# Patient Record
Sex: Female | Born: 1935 | Race: White | Hispanic: No | Smoking: Never smoker
Health system: Southern US, Community
[De-identification: ages and names within clinical notes are randomized; demographics above are authoritative.]

## PROBLEM LIST (undated history)

## (undated) DIAGNOSIS — A0472 Enterocolitis due to Clostridium difficile, not specified as recurrent: Secondary | ICD-10-CM

## (undated) DIAGNOSIS — S40812A Abrasion of left upper arm, initial encounter: Secondary | ICD-10-CM

## (undated) DIAGNOSIS — E079 Disorder of thyroid, unspecified: Secondary | ICD-10-CM

## (undated) DIAGNOSIS — S22080A Wedge compression fracture of T11-T12 vertebra, initial encounter for closed fracture: Secondary | ICD-10-CM

## (undated) DIAGNOSIS — R4182 Altered mental status, unspecified: Secondary | ICD-10-CM

## (undated) DIAGNOSIS — H547 Unspecified visual loss: Secondary | ICD-10-CM

## (undated) DIAGNOSIS — H3530 Unspecified macular degeneration: Secondary | ICD-10-CM

## (undated) DIAGNOSIS — G2581 Restless legs syndrome: Secondary | ICD-10-CM

## (undated) DIAGNOSIS — Z8619 Personal history of other infectious and parasitic diseases: Secondary | ICD-10-CM

## (undated) DIAGNOSIS — K047 Periapical abscess without sinus: Secondary | ICD-10-CM

## (undated) DIAGNOSIS — F329 Major depressive disorder, single episode, unspecified: Secondary | ICD-10-CM

## (undated) DIAGNOSIS — M19079 Primary osteoarthritis, unspecified ankle and foot: Secondary | ICD-10-CM

## (undated) DIAGNOSIS — E876 Hypokalemia: Secondary | ICD-10-CM

## (undated) DIAGNOSIS — IMO0002 Reserved for concepts with insufficient information to code with codable children: Secondary | ICD-10-CM

## (undated) DIAGNOSIS — F32A Depression, unspecified: Secondary | ICD-10-CM

## (undated) DIAGNOSIS — N289 Disorder of kidney and ureter, unspecified: Secondary | ICD-10-CM

## (undated) DIAGNOSIS — B370 Candidal stomatitis: Secondary | ICD-10-CM

## (undated) DIAGNOSIS — M1990 Unspecified osteoarthritis, unspecified site: Secondary | ICD-10-CM

## (undated) DIAGNOSIS — J300 Vasomotor rhinitis: Secondary | ICD-10-CM

## (undated) DIAGNOSIS — R0981 Nasal congestion: Secondary | ICD-10-CM

## (undated) DIAGNOSIS — I10 Essential (primary) hypertension: Secondary | ICD-10-CM

## (undated) DIAGNOSIS — R5381 Other malaise: Secondary | ICD-10-CM

## (undated) DIAGNOSIS — L89153 Pressure ulcer of sacral region, stage 3: Secondary | ICD-10-CM

## (undated) DIAGNOSIS — R945 Abnormal results of liver function studies: Secondary | ICD-10-CM

## (undated) HISTORY — DX: Periapical abscess without sinus: K04.7

## (undated) HISTORY — DX: Unspecified osteoarthritis, unspecified site: M19.90

## (undated) HISTORY — DX: Restless legs syndrome: G25.81

## (undated) HISTORY — DX: Abrasion of left upper arm, initial encounter: S40.812A

## (undated) HISTORY — DX: Personal history of other infectious and parasitic diseases: Z86.19

## (undated) HISTORY — PX: THYROIDECTOMY: SHX17

## (undated) HISTORY — DX: Nasal congestion: R09.81

## (undated) HISTORY — DX: Wedge compression fracture of t11-T12 vertebra, initial encounter for closed fracture: S22.080A

## (undated) HISTORY — DX: Depression, unspecified: F32.A

## (undated) HISTORY — DX: Primary osteoarthritis, unspecified ankle and foot: M19.079

## (undated) HISTORY — PX: ABDOMINAL HYSTERECTOMY: SHX81

## (undated) HISTORY — DX: Other malaise: R53.81

## (undated) HISTORY — DX: Pressure ulcer of sacral region, stage 3: L89.153

## (undated) HISTORY — DX: Vasomotor rhinitis: J30.0

## (undated) HISTORY — DX: Reserved for concepts with insufficient information to code with codable children: IMO0002

## (undated) HISTORY — DX: Enterocolitis due to Clostridium difficile, not specified as recurrent: A04.72

## (undated) HISTORY — DX: Abnormal results of liver function studies: R94.5

## (undated) HISTORY — PX: REVISION TOTAL KNEE ARTHROPLASTY: SHX767

## (undated) HISTORY — DX: Disorder of thyroid, unspecified: E07.9

## (undated) HISTORY — DX: Candidal stomatitis: B37.0

## (undated) HISTORY — DX: Major depressive disorder, single episode, unspecified: F32.9

## (undated) HISTORY — PX: CATARACT EXTRACTION, BILATERAL: SHX1313

## (undated) HISTORY — PX: BACK SURGERY: SHX140

## (undated) HISTORY — DX: Essential (primary) hypertension: I10

---

## 2011-11-23 ENCOUNTER — Ambulatory Visit (HOSPITAL_BASED_OUTPATIENT_CLINIC_OR_DEPARTMENT_OTHER)
Admission: RE | Admit: 2011-11-23 | Discharge: 2011-11-23 | Disposition: A | Discharge status: Home / Self Care | Payer: Medicare Other | Source: Ambulatory Visit | Attending: Internal Medicine | Admitting: Internal Medicine

## 2011-11-23 ENCOUNTER — Ambulatory Visit (INDEPENDENT_AMBULATORY_CARE_PROVIDER_SITE_OTHER): Payer: PRIVATE HEALTH INSURANCE | Admitting: Internal Medicine

## 2011-11-23 ENCOUNTER — Encounter: Payer: Self-pay | Admitting: Internal Medicine

## 2011-11-23 DIAGNOSIS — F418 Other specified anxiety disorders: Secondary | ICD-10-CM

## 2011-11-23 DIAGNOSIS — H6120 Impacted cerumen, unspecified ear: Secondary | ICD-10-CM

## 2011-11-23 DIAGNOSIS — L608 Other nail disorders: Secondary | ICD-10-CM

## 2011-11-23 DIAGNOSIS — R0609 Other forms of dyspnea: Secondary | ICD-10-CM

## 2011-11-23 DIAGNOSIS — M25519 Pain in unspecified shoulder: Secondary | ICD-10-CM | POA: Insufficient documentation

## 2011-11-23 DIAGNOSIS — S43006A Unspecified dislocation of unspecified shoulder joint, initial encounter: Secondary | ICD-10-CM | POA: Insufficient documentation

## 2011-11-23 DIAGNOSIS — R0600 Dyspnea, unspecified: Secondary | ICD-10-CM

## 2011-11-23 DIAGNOSIS — L603 Nail dystrophy: Secondary | ICD-10-CM

## 2011-11-23 DIAGNOSIS — E039 Hypothyroidism, unspecified: Secondary | ICD-10-CM

## 2011-11-23 DIAGNOSIS — Z79899 Other long term (current) drug therapy: Secondary | ICD-10-CM

## 2011-11-23 DIAGNOSIS — X58XXXA Exposure to other specified factors, initial encounter: Secondary | ICD-10-CM | POA: Insufficient documentation

## 2011-11-23 DIAGNOSIS — F341 Dysthymic disorder: Secondary | ICD-10-CM

## 2011-11-23 LAB — HEPATIC FUNCTION PANEL
Albumin: 3.9 g/dL (ref 3.5–5.2)
Total Protein: 6.7 g/dL (ref 6.0–8.3)

## 2011-11-23 LAB — CBC
MCH: 31.1 pg (ref 26.0–34.0)
MCHC: 33.5 g/dL (ref 30.0–36.0)
Platelets: 294 10*3/uL (ref 150–400)
RDW: 14.6 % (ref 11.5–15.5)

## 2011-11-23 LAB — TSH: TSH: 1.716 u[IU]/mL (ref 0.350–4.500)

## 2011-11-24 LAB — BRAIN NATRIURETIC PEPTIDE: Brain Natriuretic Peptide: 98.6 pg/mL (ref 0.0–100.0)

## 2011-11-25 DIAGNOSIS — E039 Hypothyroidism, unspecified: Secondary | ICD-10-CM | POA: Insufficient documentation

## 2011-11-25 DIAGNOSIS — F418 Other specified anxiety disorders: Secondary | ICD-10-CM | POA: Insufficient documentation

## 2011-11-25 DIAGNOSIS — M25519 Pain in unspecified shoulder: Secondary | ICD-10-CM | POA: Insufficient documentation

## 2011-11-25 DIAGNOSIS — R0600 Dyspnea, unspecified: Secondary | ICD-10-CM | POA: Insufficient documentation

## 2011-11-25 DIAGNOSIS — L603 Nail dystrophy: Secondary | ICD-10-CM | POA: Insufficient documentation

## 2011-11-25 DIAGNOSIS — H6120 Impacted cerumen, unspecified ear: Secondary | ICD-10-CM | POA: Insufficient documentation

## 2011-11-25 NOTE — Assessment & Plan Note (Signed)
Questionable dyspnea associated with LE swelling. Obtain BNP, chem7, lft, cbc

## 2011-11-25 NOTE — Progress Notes (Signed)
  Subjective:    Patient ID: [name] [name], female    DOB: Jul 23, 1919, 77 y.o.   MRN: [id number]  HPI Pt presents to clinic to establish care and for follow up of multiple medical problems. Family notes intermittent bilateral Le swelling. ?+/- mild dyspnea. No known h/o chf. Recently suffered right wrist skin tear that is well healing without drainage or spreading redness. Takes celexa and ativan scheduled bid and notes sedation during the day. +decreased hearing without ear injury or drainage. H/o chronic back and knee pain s/p surgery for both areas. Appears to be taking vicodin bid scheduled. S/p thyroidectomy maintained on thyroid replacement. Notes accident ?fall several months ago and subsequently developed intermittent pain and decreased abduction of right shoulder. Denies past evaluation. No other complaints.  Past Medical History  Diagnosis Date  . Arthritis   . History of chicken pox   . Depression     husband died September 17, 2011  . Glaucoma   . Thyroid disease   . Hypertension    Past Surgical History  Procedure Date  . Revision total knee arthroplasty     right knee  . Back surgery   . Cataract extraction, bilateral   . Thyroidectomy   . Abdominal hysterectomy     reports that she has never smoked. She has never used smokeless tobacco. She reports that she does not drink alcohol or use illicit drugs. family history is not on file. No Known Allergies   Review of Systems  HENT: Positive for hearing loss.   Respiratory: Positive for shortness of breath.   Cardiovascular: Positive for leg swelling.  Musculoskeletal: Positive for back pain and arthralgias.  Skin: Positive for wound. Negative for rash.  All other systems reviewed and are negative.       Objective:   Physical Exam  Nursing note and vitals reviewed. Constitutional: She appears well-developed and well-nourished. No distress.  HENT:  Head: Normocephalic and atraumatic.  Right Ear: External ear normal.  Left  Ear: Tympanic membrane, external ear and ear canal normal.  Nose: Nose normal.  Mouth/Throat: Oropharynx is clear and moist. No oropharyngeal exudate.       Right ear canal blocked by cerumen  Eyes: Conjunctivae are normal. Right eye exhibits no discharge. Left eye exhibits no discharge. No scleral icterus.  Neck: Neck supple.  Cardiovascular: Normal rate, regular rhythm and normal heart sounds.   Pulmonary/Chest: Effort normal and breath sounds normal. No respiratory distress. She has no wheezes. She has no rales.  Musculoskeletal:       Right shoulder- significantly reduced ROM for abduction. ?bony prominence of right humeral head. NT.  Neurological: She is alert.  Skin: Skin is warm and dry. No rash noted. She is not diaphoretic. No erythema.       Bilateral 1st and 2nd toes dystrophic. Right dorsal wrist with well healing small skin tear. No surrounding erythema and no drainage.  Psychiatric: She has a normal mood and affect.          Assessment & Plan:

## 2011-11-25 NOTE — Assessment & Plan Note (Signed)
Obtain plain radiograph of right shoulder. Consider orthopedics consult pending xray results

## 2011-11-25 NOTE — Assessment & Plan Note (Signed)
Change ativan to qhs

## 2011-11-25 NOTE — Assessment & Plan Note (Signed)
Attempt irrigation

## 2011-11-25 NOTE — Assessment & Plan Note (Signed)
Podiatry referral

## 2011-11-25 NOTE — Assessment & Plan Note (Signed)
Obtain tsh/free t4 

## 2011-11-26 ENCOUNTER — Other Ambulatory Visit: Payer: Self-pay | Admitting: Internal Medicine

## 2011-11-26 DIAGNOSIS — M25519 Pain in unspecified shoulder: Secondary | ICD-10-CM

## 2011-12-24 ENCOUNTER — Encounter: Payer: Self-pay | Admitting: Internal Medicine

## 2011-12-24 ENCOUNTER — Ambulatory Visit (INDEPENDENT_AMBULATORY_CARE_PROVIDER_SITE_OTHER): Payer: PRIVATE HEALTH INSURANCE | Admitting: Internal Medicine

## 2011-12-24 DIAGNOSIS — J310 Chronic rhinitis: Secondary | ICD-10-CM

## 2011-12-24 DIAGNOSIS — M542 Cervicalgia: Secondary | ICD-10-CM

## 2011-12-24 DIAGNOSIS — Z79899 Other long term (current) drug therapy: Secondary | ICD-10-CM

## 2011-12-24 DIAGNOSIS — F418 Other specified anxiety disorders: Secondary | ICD-10-CM

## 2011-12-24 DIAGNOSIS — D649 Anemia, unspecified: Secondary | ICD-10-CM

## 2011-12-24 DIAGNOSIS — F341 Dysthymic disorder: Secondary | ICD-10-CM

## 2011-12-25 LAB — CBC WITH DIFFERENTIAL/PLATELET
Eosinophils Absolute: 0.1 10*3/uL (ref 0.0–0.7)
Lymphocytes Relative: 25 % (ref 12–46)
Lymphs Abs: 1.6 10*3/uL (ref 0.7–4.0)
MCH: 30.1 pg (ref 26.0–34.0)
Neutro Abs: 4 10*3/uL (ref 1.7–7.7)
Neutrophils Relative %: 64 % (ref 43–77)
Platelets: 307 10*3/uL (ref 150–400)
RBC: 3.69 MIL/uL — ABNORMAL LOW (ref 3.87–5.11)
WBC: 6.3 10*3/uL (ref 4.0–10.5)

## 2011-12-25 LAB — BASIC METABOLIC PANEL
CO2: 26 mEq/L (ref 19–32)
Calcium: 9.4 mg/dL (ref 8.4–10.5)
Potassium: 5.1 mEq/L (ref 3.5–5.3)
Sodium: 131 mEq/L — ABNORMAL LOW (ref 135–145)

## 2011-12-25 LAB — VITAMIN B12: Vitamin B-12: 704 pg/mL (ref 211–911)

## 2011-12-28 ENCOUNTER — Telehealth: Payer: Self-pay | Admitting: Internal Medicine

## 2011-12-28 ENCOUNTER — Encounter (INDEPENDENT_AMBULATORY_CARE_PROVIDER_SITE_OTHER): Payer: PRIVATE HEALTH INSURANCE | Admitting: Ophthalmology

## 2011-12-28 DIAGNOSIS — H3530 Unspecified macular degeneration: Secondary | ICD-10-CM

## 2011-12-28 DIAGNOSIS — D649 Anemia, unspecified: Secondary | ICD-10-CM | POA: Insufficient documentation

## 2011-12-28 DIAGNOSIS — J310 Chronic rhinitis: Secondary | ICD-10-CM | POA: Insufficient documentation

## 2011-12-28 DIAGNOSIS — M542 Cervicalgia: Secondary | ICD-10-CM | POA: Insufficient documentation

## 2011-12-28 DIAGNOSIS — H43819 Vitreous degeneration, unspecified eye: Secondary | ICD-10-CM

## 2011-12-28 NOTE — Assessment & Plan Note (Signed)
Attempt flonase qd. 

## 2011-12-28 NOTE — Telephone Encounter (Signed)
Patients son Franky Macho called stating that patient has been complaining the past few days of severe leg cramping and twitching. Sometimes behind the knee. Franky Macho would like to know if Dr. Rodena Medin would call something in to pharmacy for patient.

## 2011-12-28 NOTE — Progress Notes (Signed)
  Subjective:    Patient ID: [name] [name], female    DOB: 02-11-1919, 78 y.o.   MRN: [id number]  HPI Pt presents to clinic for followup of multiple medical problems. Reviewed mild anemia without gross active bleeding. C/o chronic neck stiffness without injury or radicular pain.  Has chronic nasal drainage described as clear. Taking no medication for the problem. Shoulder pain improved s/p steroid injxn now followed by orthopedics. Pt and family describe mildly depressed mood despite celexa. No other complaints.    Past Medical History  Diagnosis Date  . Arthritis   . History of chicken pox   . Depression     husband died 09/14/2011  . Glaucoma   . Thyroid disease   . Hypertension    Past Surgical History  Procedure Date  . Revision total knee arthroplasty     right knee  . Back surgery   . Cataract extraction, bilateral   . Thyroidectomy   . Abdominal hysterectomy     reports that she has never smoked. She has never used smokeless tobacco. She reports that she does not drink alcohol or use illicit drugs. family history is not on file. No Known Allergies   Review of Systems see hpi     Objective:   Physical Exam  Nursing note and vitals reviewed. Constitutional: She appears well-developed and well-nourished. No distress.  HENT:  Head: Normocephalic and atraumatic.  Right Ear: External ear normal.  Left Ear: External ear normal.  Eyes: Conjunctivae are normal. No scleral icterus.  Musculoskeletal:       FROM of neck  Neurological: She is alert.  Skin: Skin is warm and dry. She is not diaphoretic.  Psychiatric: She has a normal mood and affect.          Assessment & Plan:

## 2011-12-28 NOTE — Assessment & Plan Note (Signed)
Mild. Obtain cbc, iron, b12

## 2011-12-28 NOTE — Assessment & Plan Note (Addendum)
Increase celexa dose. Followup if no improvement or worsening.

## 2011-12-28 NOTE — Assessment & Plan Note (Signed)
Primarily neck stiffness. Schedule PT

## 2011-12-28 NOTE — Telephone Encounter (Signed)
If she is truly describing leg cramps then mag ox one po qd may help. Already has prn pain medication as well

## 2011-12-28 NOTE — Telephone Encounter (Signed)
Call placed to patients son Franky Macho at [phone], no answer; no voice mail

## 2011-12-31 NOTE — Telephone Encounter (Signed)
Notified  Bev, pt's daughter in law. She states the episodes are more like "spasms" that come and go. Standing seems to relieve the "jumping". Bev reports that the jumping causes the pt's whole body to tremble. Advised her to try the Mag-Ox and call if no improvement of symptoms.

## 2012-01-07 ENCOUNTER — Telehealth: Payer: Self-pay | Admitting: *Deleted

## 2012-01-07 ENCOUNTER — Emergency Department (HOSPITAL_COMMUNITY): Payer: Medicare Other

## 2012-01-07 ENCOUNTER — Encounter (HOSPITAL_COMMUNITY): Payer: Self-pay | Admitting: Emergency Medicine

## 2012-01-07 ENCOUNTER — Emergency Department (HOSPITAL_COMMUNITY)
Admission: EM | Admit: 2012-01-07 | Discharge: 2012-01-07 | Disposition: A | Discharge status: Home / Self Care | Payer: Medicare Other | Attending: Emergency Medicine | Admitting: Emergency Medicine

## 2012-01-07 DIAGNOSIS — M25559 Pain in unspecified hip: Secondary | ICD-10-CM | POA: Insufficient documentation

## 2012-01-07 DIAGNOSIS — I10 Essential (primary) hypertension: Secondary | ICD-10-CM | POA: Insufficient documentation

## 2012-01-07 DIAGNOSIS — E039 Hypothyroidism, unspecified: Secondary | ICD-10-CM | POA: Insufficient documentation

## 2012-01-07 DIAGNOSIS — M25569 Pain in unspecified knee: Secondary | ICD-10-CM | POA: Insufficient documentation

## 2012-01-07 DIAGNOSIS — M949 Disorder of cartilage, unspecified: Secondary | ICD-10-CM | POA: Insufficient documentation

## 2012-01-07 DIAGNOSIS — M79609 Pain in unspecified limb: Secondary | ICD-10-CM

## 2012-01-07 DIAGNOSIS — M899 Disorder of bone, unspecified: Secondary | ICD-10-CM | POA: Insufficient documentation

## 2012-01-07 DIAGNOSIS — R269 Unspecified abnormalities of gait and mobility: Secondary | ICD-10-CM | POA: Insufficient documentation

## 2012-01-07 MED ORDER — OXYCODONE-ACETAMINOPHEN 5-325 MG PO TABS
ORAL_TABLET | ORAL | Status: AC
  Administered 2012-01-07: 1
  Filled 2012-01-07: qty 1

## 2012-01-07 MED ORDER — HYDROCODONE-ACETAMINOPHEN 5-500 MG PO TABS: 1.0000 | ORAL_TABLET | Freq: Three times a day (TID) | ORAL | Status: DC | PRN

## 2012-01-07 NOTE — ED Notes (Signed)
Called [facility] for transport.  

## 2012-01-07 NOTE — Progress Notes (Signed)
VASCULAR LAB PRELIMINARY  No obvious deep vein thrombosis involving the right lower extremity. No evidence of a Baker's cyst.  Mila Homer, 01/07/2012, 6:08 PM

## 2012-01-07 NOTE — Telephone Encounter (Signed)
Patients daughter in law stated patient was picked up for church yesterday and during that time she started to complain of leg pain. Bev stated patient was returned to [facility] and given some Vicodin for her pain. She states since then patient knee is swollen and she continues to have a significant amount of pain, and  is unable to move. Bev stated they were planning on taking the patient to the ER for evaluation and wanted to know if Dr Rodena Medin had a preference on which hospital the patient should be seen in. She ws informed Dr Rodena Medin is affiliated with [facility], so which ever hospital she chooses in [facility] he would be able to see the information. She asked about protocol for the assisted living as far as contacting and ambulance for transportation. She was

## 2012-01-07 NOTE — ED Notes (Signed)
Pt. Alert and oriented.  VSS.  Resp. Even unlabored. Family at bedside.  Reports right knee pain and unable to walk for 2 days.  Skin normal, no swelling, positive pedal pulse.  Normal color.

## 2012-01-07 NOTE — ED Notes (Signed)
BMW:UX32<GM> Expected date:01/07/12<BR> Expected time: 3:55 PM<BR> Means of arrival:Ambulance<BR> Comments:<BR> M10. 78 yo f. Knee pain hx of replacement, 12 min

## 2012-01-07 NOTE — Telephone Encounter (Signed)
Advised to contact the assisted living for protocol on emergency transportation. Bev verbalized understanding and agrees as instructed. She stated patient is scheduled for appointment with Dr Rodena Medin for Tuesday and if they need to cancel the appointment , she will call back.

## 2012-01-07 NOTE — ED Provider Notes (Signed)
History     CSN: [id number]  Arrival date & time 01/07/12  1602   First MD Initiated Contact with Patient 01/07/12 1632      Chief Complaint  Patient presents with  . Knee Pain    (Consider location/radiation/quality/duration/timing/severity/associated sxs/prior treatment) The history is provided by the patient.   patient's had right knee pain going up her right hip for the last approximately one. She is been unable opportunities to the pain. No trauma. No fevers. Is worse when she tries to move it. She had knee replacement remotely and another state. No relief with hydrocodone. No rash.  Past Medical History  Diagnosis Date  . Arthritis   . History of chicken pox   . Depression     husband died 09-12-2011  . Glaucoma   . Thyroid disease   . Hypertension     Past Surgical History  Procedure Date  . Revision total knee arthroplasty     right knee  . Back surgery   . Cataract extraction, bilateral   . Thyroidectomy   . Abdominal hysterectomy     History reviewed. No pertinent family history.  History  Substance Use Topics  . Smoking status: Never Smoker   . Smokeless tobacco: Never Used  . Alcohol Use: No    OB History    Grav Para Term Preterm Abortions TAB SAB Ect Mult Living                  Review of Systems  Constitutional: Negative for fever and chills.  Respiratory: Negative for shortness of breath.   Cardiovascular: Negative for chest pain and leg swelling.  Gastrointestinal: Negative for abdominal pain.  Musculoskeletal: Positive for gait problem. Negative for myalgias, back pain and joint swelling.  Skin: Negative for rash and wound.  Hematological: Negative for adenopathy. Does not bruise/bleed easily.    Allergies  Codeine; Fentanyl; and Lisinopril  Home Medications   Current Outpatient Rx  Name Route Sig Dispense Refill  . ATENOLOL 50 MG PO TABS Oral Take 50 mg by mouth daily.      . B-COMPLEX/B-12 PO TABS Oral Take by mouth.      Marland Kitchen  CITALOPRAM HYDROBROMIDE 40 MG PO TABS Oral Take 40 mg by mouth daily.    Marland Kitchen FLUTICASONE PROPIONATE 50 MCG/ACT NA SUSP Nasal Place 2 sprays into the nose daily.    . FUROSEMIDE 20 MG PO TABS Oral Take 20 mg by mouth daily.      Marland Kitchen LEVOTHYROXINE SODIUM 88 MCG PO TABS Oral Take 88 mcg by mouth daily.      Marland Kitchen LORAZEPAM 0.5 MG PO TABS Oral Take 0.5 mg by mouth at bedtime.     . MELOXICAM 7.5 MG PO TABS Oral Take 7.5 mg by mouth daily.    . OCUVITE-LUTEIN PO CAPS Oral Take 1 capsule by mouth daily.    Marland Kitchen ROPINIROLE HCL 1 MG PO TABS Oral Take 1 mg by mouth 2 (two) times daily. At 2 am and 2 pm    . ROPINIROLE HCL 1 MG PO TABS Oral Take 2 mg by mouth 2 (two) times daily. At 7 am and 7 pm    . HYDROCODONE-ACETAMINOPHEN 5-500 MG PO TABS Oral Take 1 tablet by mouth every 8 (eight) hours as needed. Pain 30 tablet 0    BP 165/52  Pulse 64  Temp(Src) 98.3 F (36.8 C) (Oral)  Resp 16  Ht 5\' 1"  (1.549 m)  Wt 130 lb (58.968 kg)  BMI  24.56 kg/m2  SpO2 97%  Physical Exam  Constitutional: She appears well-developed and well-nourished.  Abdominal: There is no tenderness.  Musculoskeletal: She exhibits no edema and no tenderness.       Mild tenderness over right lateral hip. No rash. No deformity. Range of motion appears to be intact. Mild tenderness behind right knee. Postsurgical scar anterior right knee. Mild pain with varus and valgus flexion. Neurovascularly intact distally.  Skin: Skin is warm. No rash noted. No erythema.  Psychiatric: She has a normal mood and affect.    ED Course  Procedures (including critical care time)  Labs Reviewed - No data to display Dg Hip Complete Right  01/07/2012  *RADIOLOGY REPORT*  Clinical Data: Right hip pain for 5 days, no known injury  RIGHT HIP - COMPLETE 2+ VIEW  Comparison: None  Findings: Severe osseous demineralization. Bilateral narrowing of the hip joints. SI joints symmetric. No acute fracture, dislocation, or bone destruction. Question lateral soft tissue  swelling at the hips bilaterally. Pelvis appears intact. Numerous pelvic phleboliths.  IMPRESSION: Severe osseous demineralization. No definite acute abnormalities.  Original Report Authenticated By: Lollie Marrow, M.D.   Dg Knee Complete 4 Views Right  01/07/2012  *RADIOLOGY REPORT*  Clinical Data: 4-day history of right knee pain.  No recent injuries.  History of arthroplasty in April, 2012.  RIGHT KNEE - COMPLETE 4+ VIEW 01/07/2012:  Comparison: None.  Findings: Right total knee arthroplasty with anatomic alignment. No periprosthetic lucency to suggest loosening or granulomatosis. Severe osteopenia.  No evidence of acute fracture.  Moderate sized joint effusion.  IMPRESSION: No acute osseous abnormality.  Right total knee arthroplasty with anatomic alignment and no complicating features.  Moderate sized joint effusion.  Severe osteopenia.  Original Report Authenticated By: Arnell Sieving, M.D.     1. Knee pain       MDM  Right knee pain. Previous replacement. X-ray shows only an effusion. I doubt a septic joint at this time. Doppler was negative. She'll be discharged with adjustment of pain medicine and ortho follow        Juliet Rude. Rubin Payor, MD 01/08/12 [phone]

## 2012-01-07 NOTE — ED Notes (Signed)
Right knee pain that started five days ago.  Pt. Is in assisted living at [facility] and now is unable to walk for 2 days.  Normally walks with walker.  No redness or swelling noted.

## 2012-01-08 ENCOUNTER — Telehealth: Payer: Self-pay | Admitting: *Deleted

## 2012-01-08 ENCOUNTER — Ambulatory Visit (INDEPENDENT_AMBULATORY_CARE_PROVIDER_SITE_OTHER): Payer: PRIVATE HEALTH INSURANCE | Admitting: Internal Medicine

## 2012-01-08 DIAGNOSIS — M25569 Pain in unspecified knee: Secondary | ICD-10-CM

## 2012-01-08 NOTE — Progress Notes (Signed)
  Subjective:    Patient ID: [name] [name], female    DOB: 19-Sep-1919, 78 y.o.   MRN: [id number]  HPI .    Review of Systems     Objective:   Physical Exam        Assessment & Plan:

## 2012-01-08 NOTE — Telephone Encounter (Signed)
Continue home care. Referral order placed

## 2012-01-08 NOTE — Telephone Encounter (Signed)
[name] [name] with [facility] called and left voice message requesting a verbal order to continue with Home Therapy three times per week for 2 weeks and then  Two times per week for 2 weeks.  Patients son followed up with ER visit, he stated he was informed that patient would be best served by orthopedic evaluation. He would like to know if Dr Rodena Medin would place a referral for patient for her hip and leg pain.

## 2012-01-09 NOTE — Telephone Encounter (Signed)
Call placed to [facility] at [phone] answer. A detailed voice message left informing patient per Dr Rodena Medin instructions.

## 2012-01-10 ENCOUNTER — Other Ambulatory Visit: Payer: Self-pay | Admitting: Orthopedic Surgery

## 2012-01-10 DIAGNOSIS — M79604 Pain in right leg: Secondary | ICD-10-CM

## 2012-01-14 ENCOUNTER — Telehealth: Payer: Self-pay | Admitting: *Deleted

## 2012-01-14 NOTE — Telephone Encounter (Signed)
Spoke with Dorinda Hill, and advised per instructions below. Per Sandford Craze, NP, dulcolax order should be 1 tablet (not 1mg ) daily as needed for constipation. He states pt is taking Requip 1mg  at 1am and 1pm and 1mg  2 tablets at 7am and 7pm. He requests that orders be faxed to [phone].  Please advise.

## 2012-01-14 NOTE — Telephone Encounter (Signed)
Received message from [place] at [facility] requesting an order for prn breakthrough pain meds for pt's restless leg, prn order for constipation as pt has had no bowel movement over the weekend. Pt also has difficulty swallowing her pills and they are requesting an order to crush her meds. Please advise.

## 2012-01-14 NOTE — Telephone Encounter (Signed)
OK to crush meds.   Dulcolax 1mg  PO daily PRN constipation. Please ask the nursing home to clarify her current dose and schedule of Requip for her RLS.

## 2012-01-17 ENCOUNTER — Inpatient Hospital Stay (HOSPITAL_COMMUNITY): Payer: Medicare Other

## 2012-01-17 ENCOUNTER — Emergency Department (HOSPITAL_COMMUNITY): Payer: Medicare Other

## 2012-01-17 ENCOUNTER — Encounter (HOSPITAL_COMMUNITY): Payer: Self-pay | Admitting: Emergency Medicine

## 2012-01-17 ENCOUNTER — Observation Stay (HOSPITAL_COMMUNITY)
Admission: EM | Admit: 2012-01-17 | Discharge: 2012-01-25 | DRG: 070 | Disposition: A | Discharge status: Skilled Nursing Facility | Payer: Medicare Other | Attending: Internal Medicine | Admitting: Internal Medicine

## 2012-01-17 DIAGNOSIS — Z66 Do not resuscitate: Secondary | ICD-10-CM | POA: Diagnosis present

## 2012-01-17 DIAGNOSIS — I129 Hypertensive chronic kidney disease with stage 1 through stage 4 chronic kidney disease, or unspecified chronic kidney disease: Secondary | ICD-10-CM | POA: Diagnosis present

## 2012-01-17 DIAGNOSIS — M25551 Pain in right hip: Secondary | ICD-10-CM | POA: Diagnosis present

## 2012-01-17 DIAGNOSIS — B964 Proteus (mirabilis) (morganii) as the cause of diseases classified elsewhere: Secondary | ICD-10-CM | POA: Diagnosis present

## 2012-01-17 DIAGNOSIS — N182 Chronic kidney disease, stage 2 (mild): Secondary | ICD-10-CM | POA: Diagnosis present

## 2012-01-17 DIAGNOSIS — M25559 Pain in unspecified hip: Secondary | ICD-10-CM

## 2012-01-17 DIAGNOSIS — G2581 Restless legs syndrome: Secondary | ICD-10-CM | POA: Diagnosis present

## 2012-01-17 DIAGNOSIS — N189 Chronic kidney disease, unspecified: Secondary | ICD-10-CM | POA: Diagnosis present

## 2012-01-17 DIAGNOSIS — E876 Hypokalemia: Secondary | ICD-10-CM | POA: Diagnosis present

## 2012-01-17 DIAGNOSIS — M5030 Other cervical disc degeneration, unspecified cervical region: Secondary | ICD-10-CM | POA: Diagnosis present

## 2012-01-17 DIAGNOSIS — E860 Dehydration: Secondary | ICD-10-CM | POA: Diagnosis present

## 2012-01-17 DIAGNOSIS — F411 Generalized anxiety disorder: Secondary | ICD-10-CM | POA: Diagnosis present

## 2012-01-17 DIAGNOSIS — F3289 Other specified depressive episodes: Secondary | ICD-10-CM | POA: Diagnosis present

## 2012-01-17 DIAGNOSIS — W19XXXA Unspecified fall, initial encounter: Secondary | ICD-10-CM

## 2012-01-17 DIAGNOSIS — E43 Unspecified severe protein-calorie malnutrition: Secondary | ICD-10-CM | POA: Diagnosis present

## 2012-01-17 DIAGNOSIS — Y9210 Unspecified residential institution as the place of occurrence of the external cause: Secondary | ICD-10-CM | POA: Diagnosis present

## 2012-01-17 DIAGNOSIS — M47817 Spondylosis without myelopathy or radiculopathy, lumbosacral region: Secondary | ICD-10-CM | POA: Diagnosis present

## 2012-01-17 DIAGNOSIS — G9341 Metabolic encephalopathy: Principal | ICD-10-CM | POA: Diagnosis present

## 2012-01-17 DIAGNOSIS — W010XXA Fall on same level from slipping, tripping and stumbling without subsequent striking against object, initial encounter: Secondary | ICD-10-CM | POA: Diagnosis present

## 2012-01-17 DIAGNOSIS — I359 Nonrheumatic aortic valve disorder, unspecified: Secondary | ICD-10-CM | POA: Insufficient documentation

## 2012-01-17 DIAGNOSIS — D649 Anemia, unspecified: Secondary | ICD-10-CM | POA: Diagnosis present

## 2012-01-17 DIAGNOSIS — M6282 Rhabdomyolysis: Secondary | ICD-10-CM | POA: Diagnosis present

## 2012-01-17 DIAGNOSIS — Z79899 Other long term (current) drug therapy: Secondary | ICD-10-CM | POA: Insufficient documentation

## 2012-01-17 DIAGNOSIS — M25469 Effusion, unspecified knee: Secondary | ICD-10-CM | POA: Diagnosis present

## 2012-01-17 DIAGNOSIS — A498 Other bacterial infections of unspecified site: Secondary | ICD-10-CM | POA: Diagnosis present

## 2012-01-17 DIAGNOSIS — R4182 Altered mental status, unspecified: Secondary | ICD-10-CM | POA: Diagnosis present

## 2012-01-17 DIAGNOSIS — E039 Hypothyroidism, unspecified: Secondary | ICD-10-CM | POA: Diagnosis present

## 2012-01-17 DIAGNOSIS — F329 Major depressive disorder, single episode, unspecified: Secondary | ICD-10-CM | POA: Diagnosis present

## 2012-01-17 DIAGNOSIS — F418 Other specified anxiety disorders: Secondary | ICD-10-CM | POA: Diagnosis present

## 2012-01-17 DIAGNOSIS — I10 Essential (primary) hypertension: Secondary | ICD-10-CM

## 2012-01-17 DIAGNOSIS — E871 Hypo-osmolality and hyponatremia: Secondary | ICD-10-CM

## 2012-01-17 DIAGNOSIS — N390 Urinary tract infection, site not specified: Secondary | ICD-10-CM | POA: Diagnosis present

## 2012-01-17 DIAGNOSIS — R748 Abnormal levels of other serum enzymes: Secondary | ICD-10-CM | POA: Diagnosis present

## 2012-01-17 LAB — CBC
HCT: 34.4 % — ABNORMAL LOW (ref 36.0–46.0)
Hemoglobin: 12.2 g/dL (ref 12.0–15.0)
Hemoglobin: 11.7 g/dL — ABNORMAL LOW (ref 12.0–15.0)
MCH: 30.9 pg (ref 26.0–34.0)
MCHC: 35.5 g/dL (ref 30.0–36.0)
MCV: 87.1 fL (ref 78.0–100.0)
MCV: 87.4 fL (ref 78.0–100.0)
Platelets: 333 K/uL (ref 150–400)
Platelets: 298 10*3/uL (ref 150–400)
Platelets: 303 10*3/uL (ref 150–400)
RBC: 3.95 MIL/uL (ref 3.87–5.11)
RBC: 3.8 MIL/uL — ABNORMAL LOW (ref 3.87–5.11)
RDW: 12.9 % (ref 11.5–15.5)
RDW: 12.9 % (ref 11.5–15.5)
WBC: 10.4 K/uL (ref 4.0–10.5)
WBC: 9.4 10*3/uL (ref 4.0–10.5)
WBC: 9.8 10*3/uL (ref 4.0–10.5)

## 2012-01-17 LAB — DIFFERENTIAL
Basophils Absolute: 0.1 10*3/uL (ref 0.0–0.1)
Basophils Relative: 1 % (ref 0–1)
Eosinophils Absolute: 0 10*3/uL (ref 0.0–0.7)
Eosinophils Relative: 0 % (ref 0–5)
Lymphocytes Relative: 8 % — ABNORMAL LOW (ref 12–46)
Lymphs Abs: 0.9 K/uL (ref 0.7–4.0)
Monocytes Absolute: 0.7 10*3/uL (ref 0.1–1.0)
Monocytes Relative: 7 % (ref 3–12)
Neutro Abs: 8.7 K/uL — ABNORMAL HIGH (ref 1.7–7.7)
Neutrophils Relative %: 84 % — ABNORMAL HIGH (ref 43–77)

## 2012-01-17 LAB — BASIC METABOLIC PANEL
Calcium: 8.5 mg/dL (ref 8.4–10.5)
Chloride: 90 mEq/L — ABNORMAL LOW (ref 96–112)
Creatinine, Ser: 0.48 mg/dL — ABNORMAL LOW (ref 0.50–1.10)
Creatinine, Ser: 0.5 mg/dL (ref 0.50–1.10)
GFR calc Af Amer: >90 mL/min (ref >90)
GFR calc Af Amer: >90 mL/min (ref >90)
GFR calc non Af Amer: 84 mL/min — ABNORMAL LOW (ref >90)
Potassium: 3.4 mEq/L — ABNORMAL LOW (ref 3.5–5.1)

## 2012-01-17 LAB — URINALYSIS, ROUTINE W REFLEX MICROSCOPIC
Bilirubin Urine: NEGATIVE
Glucose, UA: NEGATIVE mg/dL
Hgb urine dipstick: NEGATIVE
Ketones, ur: NEGATIVE mg/dL
Leukocytes, UA: NEGATIVE
Nitrite: NEGATIVE
Protein, ur: NEGATIVE mg/dL
Specific Gravity, Urine: 1.011 (ref 1.005–1.030)
Urobilinogen, UA: 1 mg/dL (ref 0.0–1.0)
pH: 7.5 (ref 5.0–8.0)

## 2012-01-17 LAB — COMPREHENSIVE METABOLIC PANEL
Albumin: 3.8 g/dL (ref 3.5–5.2)
BUN: 13 mg/dL (ref 6–23)
Calcium: 9.3 mg/dL (ref 8.4–10.5)
Chloride: 83 mEq/L — ABNORMAL LOW (ref 96–112)
Creatinine, Ser: 0.55 mg/dL (ref 0.50–1.10)
Total Bilirubin: 0.6 mg/dL (ref 0.3–1.2)
Total Protein: 7.8 g/dL (ref 6.0–8.3)

## 2012-01-17 LAB — CREATININE, SERUM
Creatinine, Ser: 0.48 mg/dL — ABNORMAL LOW (ref 0.50–1.10)
GFR calc Af Amer: >90 mL/min (ref >90)

## 2012-01-17 LAB — OSMOLALITY: Osmolality: 258 mOsm/kg — ABNORMAL LOW (ref 275–300)

## 2012-01-17 LAB — ACETAMINOPHEN LEVEL: Acetaminophen (Tylenol), Serum: <15 ug/mL (ref 10–30)

## 2012-01-17 LAB — COMPREHENSIVE METABOLIC PANEL WITH GFR
ALT: 23 U/L (ref 0–35)
AST: 28 U/L (ref 0–37)
Alkaline Phosphatase: 104 U/L (ref 39–117)
CO2: 25 meq/L (ref 19–32)
GFR calc Af Amer: >90 mL/min (ref >90)
GFR calc non Af Amer: 80 mL/min — ABNORMAL LOW (ref >90)
Glucose, Bld: 128 mg/dL — ABNORMAL HIGH (ref 70–99)
Potassium: 3.4 meq/L — ABNORMAL LOW (ref 3.5–5.1)
Sodium: 119 meq/L — CL (ref 135–145)

## 2012-01-17 LAB — SALICYLATE LEVEL: Salicylate Lvl: <2 mg/dL — ABNORMAL LOW (ref 2.8–20.0)

## 2012-01-17 LAB — TROPONIN I: Troponin I: <0.3 ng/mL (ref <0.30)

## 2012-01-17 LAB — CARDIAC PANEL(CRET KIN+CKTOT+MB+TROPI): Troponin I: <0.3 ng/mL (ref <0.30)

## 2012-01-17 LAB — TSH: TSH: 3.134 u[IU]/mL (ref 0.350–4.500)

## 2012-01-17 MED ORDER — MORPHINE SULFATE 2 MG/ML IJ SOLN
2.0000 mg | Freq: Once | INTRAMUSCULAR | Status: AC
  Administered 2012-01-17: 2 mg via INTRAVENOUS
  Filled 2012-01-17: qty 1

## 2012-01-17 MED ORDER — DOCUSATE SODIUM 100 MG PO CAPS
100.0000 mg | ORAL_CAPSULE | Freq: Two times a day (BID) | ORAL | Status: DC
  Administered 2012-01-17 – 2012-01-25 (×16): 100 mg via ORAL
  Filled 2012-01-17 (×18): qty 1

## 2012-01-17 MED ORDER — SODIUM CHLORIDE 0.9 % IJ SOLN
3.0000 mL | Freq: Two times a day (BID) | INTRAMUSCULAR | Status: DC
  Administered 2012-01-17 – 2012-01-25 (×11): 3 mL via INTRAVENOUS

## 2012-01-17 MED ORDER — ACETAMINOPHEN 650 MG RE SUPP: 650.0000 mg | Freq: Four times a day (QID) | RECTAL | Status: DC | PRN

## 2012-01-17 MED ORDER — LORAZEPAM 0.5 MG PO TABS
0.5000 mg | ORAL_TABLET | Freq: Two times a day (BID) | ORAL | Status: DC
  Administered 2012-01-17 – 2012-01-25 (×16): 0.5 mg via ORAL
  Filled 2012-01-17 (×16): qty 1

## 2012-01-17 MED ORDER — ROPINIROLE HCL 1 MG PO TABS
1.0000 mg | ORAL_TABLET | Freq: Two times a day (BID) | ORAL | Status: DC
  Administered 2012-01-17 – 2012-01-18 (×3): 1 mg via ORAL
  Filled 2012-01-17 (×7): qty 1

## 2012-01-17 MED ORDER — ENOXAPARIN SODIUM 40 MG/0.4ML ~~LOC~~ SOLN
40.0000 mg | SUBCUTANEOUS | Status: DC
  Administered 2012-01-17 – 2012-01-24 (×8): 40 mg via SUBCUTANEOUS
  Filled 2012-01-17 (×9): qty 0.4

## 2012-01-17 MED ORDER — ONDANSETRON HCL 4 MG/2ML IJ SOLN: 4.0000 mg | Freq: Four times a day (QID) | INTRAMUSCULAR | Status: DC | PRN

## 2012-01-17 MED ORDER — ACETAMINOPHEN 325 MG PO TABS
650.0000 mg | ORAL_TABLET | Freq: Four times a day (QID) | ORAL | Status: DC | PRN
  Administered 2012-01-18: 650 mg via ORAL
  Filled 2012-01-17 (×3): qty 2

## 2012-01-17 MED ORDER — MELOXICAM 7.5 MG PO TABS
7.5000 mg | ORAL_TABLET | Freq: Every day | ORAL | Status: DC
  Administered 2012-01-18 – 2012-01-25 (×8): 7.5 mg via ORAL
  Filled 2012-01-17 (×8): qty 1

## 2012-01-17 MED ORDER — HYDROMORPHONE HCL PF 1 MG/ML IJ SOLN
0.5000 mg | INTRAMUSCULAR | Status: DC | PRN
  Administered 2012-01-17 – 2012-01-18 (×3): 0.5 mg via INTRAVENOUS
  Administered 2012-01-19: 07:00:00 via INTRAVENOUS
  Filled 2012-01-17 (×6): qty 1

## 2012-01-17 MED ORDER — OXYCODONE HCL 5 MG PO TABS
5.0000 mg | ORAL_TABLET | ORAL | Status: DC | PRN
  Administered 2012-01-22 – 2012-01-25 (×6): 5 mg via ORAL
  Filled 2012-01-17 (×7): qty 1

## 2012-01-17 MED ORDER — VITAMIN B-12 1000 MCG PO TABS
1000.0000 ug | ORAL_TABLET | Freq: Every day | ORAL | Status: DC
  Administered 2012-01-18 – 2012-01-25 (×8): 1000 ug via ORAL
  Filled 2012-01-17 (×8): qty 1

## 2012-01-17 MED ORDER — ALUM & MAG HYDROXIDE-SIMETH 200-200-20 MG/5ML PO SUSP: 30.0000 mL | Freq: Four times a day (QID) | ORAL | Status: DC | PRN

## 2012-01-17 MED ORDER — ALBUTEROL SULFATE (5 MG/ML) 0.5% IN NEBU: 2.5000 mg | INHALATION_SOLUTION | RESPIRATORY_TRACT | Status: DC | PRN

## 2012-01-17 MED ORDER — SODIUM CHLORIDE 0.9 % IV SOLN: Freq: Once | INTRAVENOUS | Status: DC

## 2012-01-17 MED ORDER — BISACODYL 5 MG PO TBEC
5.0000 mg | DELAYED_RELEASE_TABLET | Freq: Every day | ORAL | Status: DC | PRN
  Filled 2012-01-17: qty 10
  Filled 2012-01-17: qty 1

## 2012-01-17 MED ORDER — LABETALOL HCL 5 MG/ML IV SOLN
10.0000 mg | INTRAVENOUS | Status: DC | PRN
  Administered 2012-01-20: 07:00:00 via INTRAVENOUS
  Filled 2012-01-17 (×2): qty 4

## 2012-01-17 MED ORDER — POTASSIUM CHLORIDE CRYS ER 20 MEQ PO TBCR: 40.0000 meq | EXTENDED_RELEASE_TABLET | Freq: Once | ORAL | Status: DC

## 2012-01-17 MED ORDER — LEVOTHYROXINE SODIUM 88 MCG PO TABS
88.0000 ug | ORAL_TABLET | Freq: Every day | ORAL | Status: DC
  Administered 2012-01-18 – 2012-01-25 (×8): 88 ug via ORAL
  Filled 2012-01-17 (×8): qty 1

## 2012-01-17 MED ORDER — ATENOLOL 50 MG PO TABS
50.0000 mg | ORAL_TABLET | Freq: Every day | ORAL | Status: DC
  Administered 2012-01-18 – 2012-01-25 (×8): 50 mg via ORAL
  Filled 2012-01-17 (×8): qty 1

## 2012-01-17 MED ORDER — ONDANSETRON HCL 4 MG PO TABS: 4.0000 mg | ORAL_TABLET | Freq: Four times a day (QID) | ORAL | Status: DC | PRN

## 2012-01-17 MED ORDER — FLUTICASONE PROPIONATE 50 MCG/ACT NA SUSP
2.0000 | Freq: Every day | NASAL | Status: DC
  Filled 2012-01-17 (×2): qty 16

## 2012-01-17 MED ORDER — SODIUM CHLORIDE 0.9 % IV SOLN
INTRAVENOUS | Status: AC
  Administered 2012-01-17: 17:00:00 via INTRAVENOUS

## 2012-01-17 NOTE — ED Notes (Signed)
Pt presenting to ed with c/o falling and having right hip pain. Pt is alert at this time. Pt palpable pulses in right leg.

## 2012-01-17 NOTE — ED Provider Notes (Signed)
History     CSN: [id number]  Arrival date & time 01/17/12  1142   First MD Initiated Contact with Patient 01/17/12 1204      Chief Complaint  Patient presents with  . Hip Pain    (Consider location/radiation/quality/duration/timing/severity/associated sxs/prior treatment) HPI Comments: Patient is generally uncooperative in her history to 2 being in excessive pain. Most the history is obtained from the patient's son. He reports that the patient was seen a week ago due two right hip and knee pain. She has a history of right knee replacement. She had x-rays which were unremarkable. I reviewed those plain films which revealed a slight effusion of her right knee. It is unclear to me whether or not the patient had had a fall. Do to her pain in her right hip continuing, she was seen by her orthopedist Dr. Madelon Lips.  The plan is to obtain an MRI of her low back as her orthopedist feels that she may have sciatica. However today while she was in the bathroom and do to worsening difficulty walking due to her right hip pain, she had a mechanical fall. She reports that she did hit her right shoulder and the back of her head. There is no report and she denies losing consciousness. Here on exam she does complain of some posterior neck pain. She denies a headache. She denies any nausea or vomiting. She reports minimal pain to her right shoulder. The patient's son reports that she has rotator cuff problems of the right shoulder at baseline. She continues to complain of pain in the right hip which is exacerbated by movement. She denies any chest pain, palpitations. She denies any abdominal or flank pain. She denies low back pain. Level 5 caveat due to pt's level of cooperation and pain.    Patient is a 78 y.o. female presenting with hip pain. The history is provided by the patient and a relative.  Hip Pain    Past Medical History  Diagnosis Date  . Arthritis   . History of chicken pox   . Depression    husband died 2011-10-04  . Glaucoma   . Thyroid disease   . Hypertension     Past Surgical History  Procedure Date  . Revision total knee arthroplasty     right knee  . Back surgery   . Cataract extraction, bilateral   . Thyroidectomy   . Abdominal hysterectomy     No family history on file.  History  Substance Use Topics  . Smoking status: Never Smoker   . Smokeless tobacco: Never Used  . Alcohol Use: No    OB History    Grav Para Term Preterm Abortions TAB SAB Ect Mult Living                  Review of Systems  Unable to perform ROS: Other    Allergies  Codeine; Fentanyl; and Lisinopril  Home Medications   Current Outpatient Rx  Name Route Sig Dispense Refill  . ACETAMINOPHEN 325 MG PO TABS Oral Take 650 mg by mouth every 6 (six) hours as needed. For pain    . ATENOLOL 50 MG PO TABS Oral Take 50 mg by mouth daily.      Marland Kitchen BISACODYL 5 MG PO TBEC Oral Take 5 mg by mouth daily as needed.    Marland Kitchen CITALOPRAM HYDROBROMIDE 40 MG PO TABS Oral Take 40 mg by mouth daily.    Marland Kitchen FLUTICASONE PROPIONATE 50 MCG/ACT NA SUSP Nasal Place  2 sprays into the nose daily.    . FUROSEMIDE 20 MG PO TABS Oral Take 20 mg by mouth daily.      Marland Kitchen HYDROCODONE-ACETAMINOPHEN 5-500 MG PO TABS Oral Take 1 tablet by mouth 2 (two) times daily.    Marland Kitchen LEVOTHYROXINE SODIUM 88 MCG PO TABS Oral Take 88 mcg by mouth daily.      Marland Kitchen LORAZEPAM 0.5 MG PO TABS Oral Take 0.5 mg by mouth 2 (two) times daily.     . MELOXICAM 7.5 MG PO TABS Oral Take 7.5 mg by mouth daily. Take with food.    Marland Kitchen PRESERVISION AREDS 2 PO Oral Take 1 tablet by mouth daily.    Marland Kitchen ROPINIROLE HCL 1 MG PO TABS Oral Take 1 mg by mouth 2 (two) times daily. At 2 am and 2 pm    . ROPINIROLE HCL 1 MG PO TABS Oral Take 2 mg by mouth 2 (two) times daily. At 7 am and 7 pm    . VITAMIN B-12 1000 MCG PO TABS Oral Take 1,000 mcg by mouth daily.      BP 209/81  Pulse 69  Temp(Src) 97.6 F (36.4 C) (Oral)  Resp 20  SpO2 100%  Physical Exam  Nursing  note and vitals reviewed. Constitutional: She appears well-developed and well-nourished. She appears distressed.  HENT:  Head: Normocephalic and atraumatic.  Eyes: Pupils are equal, round, and reactive to light. No scleral icterus.  Neck: No spinous process tenderness present. No tracheal deviation present.  Cardiovascular: Normal rate.   Pulmonary/Chest: Effort normal. She has no wheezes. She has no rales.  Abdominal: Soft. There is no tenderness.  Musculoskeletal:       I am able to gently rotate her right hip and the patient reports it only hurts a little bit. Her pelvis is stable and nontender to AP and lateral compression. Patient has reasonable range of motion of her right shoulder and denies any worsening of pain. She has a normal grip strength of her right hand. I do not see or appreciate any open wounds. She has some limited mobility of her right knee. She has some mild tenderness on both sides of the paraspinal regions of her posterior neck.  Neurological: She is alert.  Skin: Skin is warm and dry. No rash noted. She is not diaphoretic.    ED Course  Procedures (including critical care time)  Labs Reviewed  CBC - Abnormal; Notable for the following:    HCT 34.4 (*)    All other components within normal limits  DIFFERENTIAL - Abnormal; Notable for the following:    Neutrophils Relative 84 (*)    Neutro Abs 8.7 (*)    Lymphocytes Relative 8 (*)    All other components within normal limits  COMPREHENSIVE METABOLIC PANEL - Abnormal; Notable for the following:    Sodium 119 (*)    Potassium 3.4 (*)    Chloride 83 (*)    Glucose, Bld 128 (*)    GFR calc non Af Amer 80 (*)    All other components within normal limits  SALICYLATE LEVEL - Abnormal; Notable for the following:    Salicylate Lvl <2.0 (*)    All other components within normal limits  URINALYSIS, ROUTINE W REFLEX MICROSCOPIC  TROPONIN I  ACETAMINOPHEN LEVEL   Ct Head Wo Contrast  01/17/2012  *RADIOLOGY REPORT*   Clinical Data: History of fall complaining of headaches and neck pain.  CT HEAD WITHOUT CONTRAST  Technique:  Contiguous axial images  were obtained from the base of the skull through the vertex without contrast.  Comparison: No priors.  Findings: No acute intracranial abnormalities.  Specifically, no evidence of acute intracranial hemorrhage, mass, mass effect, hydrocephalus or abnormal intra- or extra-axial fluid collections. There is moderate cerebral atrophy (age appropriate) with associated ex vacuo dilatation of the ventricular system.  Mild decreased attenuation throughout the deep and periventricular white matter of the cerebral hemispheres bilaterally is noted, likely indicative of chronic microvascular ischemic changes.  No displaced skull fractures.  Visualized paranasal sinuses and mastoids are well pneumatized.  IMPRESSION:  1.  No acute intracranial abnormalities. 2.  Moderate (age appropriate) cerebral atrophy. 3.  Probable mild chronic microvascular ischemic changes in the deep and periventricular white matter of the cerebral hemispheres bilaterally.  Original Report Authenticated By: Florencia Reasons, M.D.   Ct Cervical Spine Wo Contrast  01/17/2012  *RADIOLOGY REPORT*  Clinical Data: History of fall complaining of neck pain.  CT CERVICAL SPINE WITHOUT CONTRAST  Technique:  Multidetector CT imaging of the cervical spine was performed. Multiplanar CT image reconstructions were also generated.  Comparison: No priors.  Findings:  No acute displaced fracture or gross malalignment of the cervical spine is noted.  There are multilevel changes of degenerative disc disease, most pronounced at C4-C5 where there is some calcification and small locules of gas within the intervertebral disc, and minimal (2 mm) anterolisthesis of C4 upon C5.  Multilevel facet arthropathy is also noted.  Prevertebral soft tissues are normal.  Visualized lung apices are remarkable for septal thickening bilaterally, which could  suggest interstitial pulmonary edema.  In addition, multiple small pulmonary nodules are noted in the lungs bilaterally, predominately in the right apex where the largest nodule measures up to 5 mm in diameter (image 79 of series 7). Extensive atherosclerosis in the aortic arch and great vessels.  IMPRESSION: 1.  No acute abnormality of the cervical spine. 2.  Multilevel degenerative disc disease and cervical spondylosis, as above. 3.  Probable interstitial pulmonary edema noted in the lung apices bilaterally. 4.  Multiple pulmonary nodules visualized in the lung apices, the largest measuring 5 mm in the right apex.  Appearance is nonspecific, but is favored to be of infectious etiology (findings are asymmetric, greater on the right than on the left), possibly chronic.  Correlation with chest radiograph or chest CT is recommended at this time.  Original Report Authenticated By: Florencia Reasons, M.D.   Ct Hip Right Wo Contrast  01/17/2012  *RADIOLOGY REPORT*  Clinical Data: Fall.  Right hip pain.  Hysterectomy.  CT OF THE RIGHT HIP WITHOUT CONTRAST  Technique:  Multidetector CT imaging was performed according to the standard protocol. Multiplanar CT image reconstructions were also generated.  Comparison: 01/07/2012 radiographs  Findings: The spurring of the trochanters, spurring of the trochanters and acetabulum noted.  There is atrophy of the gluteus minimus and medius, potentially with low-level edema tracking along this region of fatty atrophy.  Edema tracks adjacent to the greater trochanter.  A definite fracture is not well observed.  IMPRESSION:  1.  Although there is some edema in the soft tissues adjacent to the hip, including along the trochanteric bursa, I do not observe a fracture.  There is some spurring of the acetabulum and proximal humerus.  If there is high clinical index of suspicion of occult fracture and if no contraindication such as pacemaker, MRI could provide greater sensitivity and  negative predictive value for hip fracture.  Original Report Authenticated By:  Dellia Cloud, M.D.     1. Hyponatremia   2. Altered mental status   3. Fall   4. Pain in hip       MDM   Since plain films were negative last week, I will do a CT scan of her right hip. Given her fall and injuries, given her age I will obtain CTs of her head and her cervical spine. Patient's mental status and ability to me to communicate with me are likely limited due to her pain, however since she is not completely alert and oriented x3, I will CT her head. However in general she appears to be in her baseline according to the patient's son who is at the bedside. Patient is currently hypertensive which he has a history of peptic ulcer do to her pain. She was receiving Vicodin at her facility. She is allergic to fentanyl and therefore was not given any analgesics by EMS prior to arrival.      2:04 PM I spoke to family member and also RN at [facility] who reports that pt is not usually confused and thus her current disorientation is unusual.  Head CT which I reviewed, shows no acute abn's.  Pt can raise both arms symmetrically, no facial droop noted.  UA is ok.   3:27 PM Na is 119 which would explain her altered mental status.  NS is given.  Would refrain from free water.  Will need admission.  Will consult hospitalist.  HTN is still present, slightly improved.  Pt has taken all of her usual meds.  Given the Na, I doubt HTN emergency causing this.       Spoke to Dr. Olena Leatherwood who will see and admit to hospital.  Gavin Pound. Oletta Lamas, MD 01/18/12 [phone]

## 2012-01-17 NOTE — Progress Notes (Signed)
CK MB 7.8 text sent to Benedetto Coons to inform him of alert value.

## 2012-01-17 NOTE — ED Notes (Signed)
md Ghim notified and aware of pt critical sodium of 119

## 2012-01-17 NOTE — ED Notes (Signed)
Report called to [place] rn. Pt transported to floor

## 2012-01-17 NOTE — H&P (Signed)
PCP:   Florentina Jenny, MD, MD   Chief Complaint:  Altered mental status and hip pain.  HPI: Patient is a 78 year old white female with past medical history significant for hypertension, depression, thyroid disease. Patient stated that for the past 2 weeks she's been having severe pain in the right hip. She has gone to her orthopedic doctor Dr. Madelon Lips. She was scheduled to have MRI of her lumbar spine tomorrow. Per her daughter in law patient resides at [facility] home. She fell today in the bathroom. She thinks she fell secondary to severe pain in the hip. Since her fall  she's been confused. Normally she is with only very very mild dementia. The daughter in law notes that this confusion is on and off.  Sometimes she'll have lucid moments and then she is confused again. In the emergency room she's had head CT, CT of her hip that did not show any acute injury. Her sodium was at 119. We were asked to admit patient for further management. Per talking to patient's son she is not complaining of any chest pain or shortness of breath, he has not noted any focal weakness. He has noticed however over the past 2 weeks she has had decreased by mouth intake secondary to severe pain in her hip.  Review of Systems:  10 point review of systems negative otherwise stated in history of present illness.  Past Medical History: Past Medical History  Diagnosis Date  . Arthritis   . History of chicken pox   . Depression     husband died Oct 03, 2011  . Glaucoma   . Thyroid disease   . Hypertension    Past Surgical History  Procedure Date  . Revision total knee arthroplasty     right knee  . Back surgery   . Cataract extraction, bilateral   . Thyroidectomy   . Abdominal hysterectomy     Medications: Prior to Admission medications   Medication Sig Start Date End Date Taking? Authorizing Provider  acetaminophen (TYLENOL) 325 MG tablet Take 650 mg by mouth every 6 (six) hours as needed. For pain   Yes Historical  Provider, MD  atenolol (TENORMIN) 50 MG tablet Take 50 mg by mouth daily.     Yes Historical Provider, MD  bisacodyl (DULCOLAX) 5 MG EC tablet Take 5 mg by mouth daily as needed.   Yes Historical Provider, MD  citalopram (CELEXA) 40 MG tablet Take 40 mg by mouth daily.   Yes Historical Provider, MD  fluticasone (FLONASE) 50 MCG/ACT nasal spray Place 2 sprays into the nose daily.   Yes Historical Provider, MD  furosemide (LASIX) 20 MG tablet Take 20 mg by mouth daily.     Yes Historical Provider, MD  HYDROcodone-acetaminophen (VICODIN) 5-500 MG per tablet Take 1 tablet by mouth 2 (two) times daily. 01/07/12  Yes Nathan R. Pickering, MD  levothyroxine (SYNTHROID, LEVOTHROID) 88 MCG tablet Take 88 mcg by mouth daily.     Yes Historical Provider, MD  LORazepam (ATIVAN) 0.5 MG tablet Take 0.5 mg by mouth 2 (two) times daily.    Yes Historical Provider, MD  meloxicam (MOBIC) 7.5 MG tablet Take 7.5 mg by mouth daily. Take with food.   Yes Historical Provider, MD  Multiple Vitamins-Minerals (PRESERVISION AREDS 2 PO) Take 1 tablet by mouth daily.   Yes Historical Provider, MD  rOPINIRole (REQUIP) 1 MG tablet Take 1 mg by mouth 2 (two) times daily. At 2 am and 2 pm   Yes Historical Provider, MD  rOPINIRole (  REQUIP) 1 MG tablet Take 2 mg by mouth 2 (two) times daily. At 7 am and 7 pm   Yes Historical Provider, MD  vitamin B-12 (CYANOCOBALAMIN) 1000 MCG tablet Take 1,000 mcg by mouth daily.   Yes Historical Provider, MD    Allergies:   Allergies  Allergen Reactions  . Codeine Other (See Comments)    insomnia  . Fentanyl Hives and Rash  . Lisinopril Nausea And Vomiting, Rash and Hypertension    Social History:  Reports that she has never smoked. She has never used smokeless tobacco. She reports that she does not drink alcohol or use illicit drugs. Patient is widowed and has 2 children. Her husband died in 10-08-23 of last year since then she's moved to [place] where her son is.   Family  History: Noncontributory..  Physical Exam: Filed Vitals:   01/17/12 1157 01/17/12 1316  BP: 204/78 209/81  Pulse: 67 69  Temp: 97.7 F (36.5 C) 97.6 F (36.4 C)  TempSrc: Oral Oral  Resp: 20   SpO2: 99% 100%   general: Patient appears younger than her stated age. HEENT: Head is normocephalic atraumatic her pupils were reactive to light throat without erythema mucous membranes seem dry. Cardiovascular: Regular rate rhythm with a 2/6 systolic murmur. Lungs: Clear to auscultation bilaterally no wheezes rhonchus or rales. Abdomen: Soft nontender nondistended positive bowel sounds. Extremities: No edema. Neuro: Cranial nerves 2-12 intact strength 5 out of 5 throughout. Patient was able to give for most of the history but that would have periods where she is intermittently confused.    Labs on Admission:   Basename 01/17/12 1435  NA 119*  K 3.4*  CL 83*  CO2 25  GLUCOSE 128*  BUN 13  CREATININE 0.55  CALCIUM 9.3  MG --  PHOS --    Basename 01/17/12 1435  AST 28  ALT 23  ALKPHOS 104  BILITOT 0.6  PROT 7.8  ALBUMIN 3.8   No results found for this basename: LIPASE:2,AMYLASE:2 in the last 72 hours  Basename 01/17/12 1435  WBC 10.4  NEUTROABS 8.7*  HGB 12.2  HCT 34.4*  MCV 87.1  PLT 333    Basename 01/17/12 1435  CKTOTAL --  CKMB --  CKMBINDEX --  TROPONINI <0.30   No results found for this basename: TSH,T4TOTAL,FREET3,T3FREE,THYROIDAB in the last 72 hours No results found for this basename: VITAMINB12:2,FOLATE:2,FERRITIN:2,TIBC:2,IRON:2,RETICCTPCT:2 in the last 72 hours  Radiological Exams on Admission: Dg Hip Complete Right  01/07/2012  *RADIOLOGY REPORT*  Clinical Data: Right hip pain for 5 days, no known injury  RIGHT HIP - COMPLETE 2+ VIEW  Comparison: None  Findings: Severe osseous demineralization. Bilateral narrowing of the hip joints. SI joints symmetric. No acute fracture, dislocation, or bone destruction. Question lateral soft tissue swelling at  the hips bilaterally. Pelvis appears intact. Numerous pelvic phleboliths.  IMPRESSION: Severe osseous demineralization. No definite acute abnormalities.  Original Report Authenticated By: Lollie Marrow, M.D.   Ct Head Wo Contrast  01/17/2012  *RADIOLOGY REPORT*  Clinical Data: History of fall complaining of headaches and neck pain.  CT HEAD WITHOUT CONTRAST  Technique:  Contiguous axial images were obtained from the base of the skull through the vertex without contrast.  Comparison: No priors.  Findings: No acute intracranial abnormalities.  Specifically, no evidence of acute intracranial hemorrhage, mass, mass effect, hydrocephalus or abnormal intra- or extra-axial fluid collections. There is moderate cerebral atrophy (age appropriate) with associated ex vacuo dilatation of the ventricular system.  Mild decreased  attenuation throughout the deep and periventricular white matter of the cerebral hemispheres bilaterally is noted, likely indicative of chronic microvascular ischemic changes.  No displaced skull fractures.  Visualized paranasal sinuses and mastoids are well pneumatized.  IMPRESSION:  1.  No acute intracranial abnormalities. 2.  Moderate (age appropriate) cerebral atrophy. 3.  Probable mild chronic microvascular ischemic changes in the deep and periventricular white matter of the cerebral hemispheres bilaterally.  Original Report Authenticated By: Florencia Reasons, M.D.   Ct Cervical Spine Wo Contrast  01/17/2012  *RADIOLOGY REPORT*  Clinical Data: History of fall complaining of neck pain.  CT CERVICAL SPINE WITHOUT CONTRAST  Technique:  Multidetector CT imaging of the cervical spine was performed. Multiplanar CT image reconstructions were also generated.  Comparison: No priors.  Findings:  No acute displaced fracture or gross malalignment of the cervical spine is noted.  There are multilevel changes of degenerative disc disease, most pronounced at C4-C5 where there is some calcification and small  locules of gas within the intervertebral disc, and minimal (2 mm) anterolisthesis of C4 upon C5.  Multilevel facet arthropathy is also noted.  Prevertebral soft tissues are normal.  Visualized lung apices are remarkable for septal thickening bilaterally, which could suggest interstitial pulmonary edema.  In addition, multiple small pulmonary nodules are noted in the lungs bilaterally, predominately in the right apex where the largest nodule measures up to 5 mm in diameter (image 79 of series 7). Extensive atherosclerosis in the aortic arch and great vessels.  IMPRESSION: 1.  No acute abnormality of the cervical spine. 2.  Multilevel degenerative disc disease and cervical spondylosis, as above. 3.  Probable interstitial pulmonary edema noted in the lung apices bilaterally. 4.  Multiple pulmonary nodules visualized in the lung apices, the largest measuring 5 mm in the right apex.  Appearance is nonspecific, but is favored to be of infectious etiology (findings are asymmetric, greater on the right than on the left), possibly chronic.  Correlation with chest radiograph or chest CT is recommended at this time.  Original Report Authenticated By: Florencia Reasons, M.D.   Ct Hip Right Wo Contrast  01/17/2012  *RADIOLOGY REPORT*  Clinical Data: Fall.  Right hip pain.  Hysterectomy.  CT OF THE RIGHT HIP WITHOUT CONTRAST  Technique:  Multidetector CT imaging was performed according to the standard protocol. Multiplanar CT image reconstructions were also generated.  Comparison: 01/07/2012 radiographs  Findings: The spurring of the trochanters, spurring of the trochanters and acetabulum noted.  There is atrophy of the gluteus minimus and medius, potentially with low-level edema tracking along this region of fatty atrophy.  Edema tracks adjacent to the greater trochanter.  A definite fracture is not well observed.  IMPRESSION:  1.  Although there is some edema in the soft tissues adjacent to the hip, including along the  trochanteric bursa, I do not observe a fracture.  There is some spurring of the acetabulum and proximal humerus.  If there is high clinical index of suspicion of occult fracture and if no contraindication such as pacemaker, MRI could provide greater sensitivity and negative predictive value for hip fracture.  Original Report Authenticated By: Dellia Cloud, M.D.   Dg Knee Complete 4 Views Right  01/07/2012  *RADIOLOGY REPORT*  Clinical Data: 4-day history of right knee pain.  No recent injuries.  History of arthroplasty in April, 2012.  RIGHT KNEE - COMPLETE 4+ VIEW 01/07/2012:  Comparison: None.  Findings: Right total knee arthroplasty with anatomic alignment. No periprosthetic lucency to  suggest loosening or granulomatosis. Severe osteopenia.  No evidence of acute fracture.  Moderate sized joint effusion.  IMPRESSION: No acute osseous abnormality.  Right total knee arthroplasty with anatomic alignment and no complicating features.  Moderate sized joint effusion.  Severe osteopenia.  Original Report Authenticated By: Arnell Sieving, M.D.    Assessment/Plan .Altered mental status Her altered mental status is most likely secondary to her hyponatremia. Will continue her on normal saline at 100 an hour for the next 12 hours then decreased to 50cc an hour. Will check urinary osmolality and urine sodium and serum osmolality. Will also repeat another stat BMP to make sure that her sodium is increasing with IV fluids. Will check orthostatic vitals. With her systolic blood pressure being elevated over 200 will get MRI of her head just to rule out stroke since per daughter states that she took her blood pressure medicines this morning and normally her blood pressure runs in the 130s.  Marland KitchenHyponatremia As mentioned above her hyponatremia is most likely secondary to dehydration. Will also stop her Celexa as that can cause some low sodium. That can be restarted once her sodium starts improving. Per discussion  with daughter she's been on Celexa for a long time. I doubt this is the cause of her hyponatremia the most likely secondary to dehydration.   .Dehydration Patient to be hydrated per looking at her mucous membranes dry she is on atenolol so that the skin is her heart rate, heart rate is normal. Will rehydrate her. Looked at a specific gravity of her urine and check her urinary sodium.   Marland KitchenRLS (restless legs syndrome) Patient has history of restless leg syndrome. Per discussion with son she is on Requip which really helps her and he would like her to continue the Requip while she is inpatient.   Marland KitchenHTN (hypertension) Patient's blood pressure is very elevated. Systolic is 204. Will continue her on her Atenolol and start her on Labetalol when necessary for blood pressure greater than 160 while waiting on the MRI.   Marland KitchenHypokalemia Potassium is slightly low most likely secondary to patient being on Lasix. Will hold her Lasix for now and repeat her potassium. Doubt patient has adrenal insufficiency as her blood pressure is elevated and not low.   .Hip pain, right Patient continues to have hip pain. Dr. Madelon Lips ordered at MRI of the L-spine. Family would like that done while patient is in house. Ordered MRI of the L-spine and will defer to rounding physician on ortho consult  Dr. Christian Mate in the morning. Discussed this with patient's family that the consult will be based on the discretion of the attending physician.  Consulted physical therapy.  Time  with patient spent on this patient including examination and decision-making process: 60 minutes. Discussed CODE STATUS with patient's son. Patient is DO NOT RESUSCITATE.  Carollee Massed [phone] 01/17/2012, 4:51 PM

## 2012-01-18 ENCOUNTER — Inpatient Hospital Stay: Admission: RE | Admit: 2012-01-18 | Payer: PRIVATE HEALTH INSURANCE | Source: Ambulatory Visit

## 2012-01-18 DIAGNOSIS — I359 Nonrheumatic aortic valve disorder, unspecified: Secondary | ICD-10-CM

## 2012-01-18 LAB — CARDIAC PANEL(CRET KIN+CKTOT+MB+TROPI)
CK, MB: 7.7 ng/mL (ref 0.3–4.0)
Relative Index: 3.8 — ABNORMAL HIGH (ref 0.0–2.5)
Relative Index: 2.9 — ABNORMAL HIGH (ref 0.0–2.5)
Troponin I: <0.3 ng/mL (ref <0.30)
Troponin I: <0.3 ng/mL (ref <0.30)

## 2012-01-18 LAB — CBC
HCT: 30.7 % — ABNORMAL LOW (ref 36.0–46.0)
Hemoglobin: 10.8 g/dL — ABNORMAL LOW (ref 12.0–15.0)
RDW: 13.1 % (ref 11.5–15.5)
WBC: 6.4 10*3/uL (ref 4.0–10.5)

## 2012-01-18 LAB — BASIC METABOLIC PANEL
BUN: 9 mg/dL (ref 6–23)
BUN: 16 mg/dL (ref 6–23)
Calcium: 8.1 mg/dL — ABNORMAL LOW (ref 8.4–10.5)
Chloride: 92 mEq/L — ABNORMAL LOW (ref 96–112)
Creatinine, Ser: 0.8 mg/dL (ref 0.50–1.10)
GFR calc Af Amer: >90 mL/min (ref >90)
GFR calc Af Amer: 73 mL/min — ABNORMAL LOW (ref >90)
GFR calc non Af Amer: 63 mL/min — ABNORMAL LOW (ref >90)
Potassium: 3.5 mEq/L (ref 3.5–5.1)
Potassium: 3.5 mEq/L (ref 3.5–5.1)

## 2012-01-18 LAB — OSMOLALITY, URINE: Osmolality, Ur: 422 mOsm/kg (ref 390–1090)

## 2012-01-18 MED ORDER — FLUTICASONE PROPIONATE 50 MCG/ACT NA SUSP
2.0000 | Freq: Every day | NASAL | Status: DC
  Administered 2012-01-18 – 2012-01-24 (×7): 2 via NASAL
  Filled 2012-01-18: qty 16

## 2012-01-18 MED ORDER — SODIUM CHLORIDE 0.9 % IV SOLN
INTRAVENOUS | Status: DC
  Administered 2012-01-18 – 2012-01-20 (×4): via INTRAVENOUS

## 2012-01-18 MED ORDER — ROPINIROLE HCL 1 MG PO TABS
1.0000 mg | ORAL_TABLET | Freq: Two times a day (BID) | ORAL | Status: DC
  Administered 2012-01-18 – 2012-01-19 (×2): 1 mg via ORAL
  Filled 2012-01-18 (×3): qty 1

## 2012-01-18 NOTE — Progress Notes (Signed)
PT Cancellation Note  Treatment cancelled today due to medical issues with patient which prohibited therapy.  Patient with CKMB 7.8 and no evidence of downward trend yet.  Will cancel and check back at later date.  Seryna Marek,CYNDI 01/18/2012, 3:08 PM

## 2012-01-18 NOTE — Progress Notes (Signed)
PROGRESS NOTE  [name] [name] ZOX:[id number] DOB: 1919-01-26 DOA: 01/17/2012 PCP: Letitia Libra, Ala Dach, MD, MD  Brief narrative: 78 year old woman with mechanical fall secondary to right hip pain. Also struck right shoulder back of head. Was noted to have a sodium of 119 and referred for admission. For the past 2 weeks she has had severe right hip pain. She is scheduled to have MRI of her lumbar spine February 8.  Seen January 28 for knee pain. X-ray demonstrated effusion. Referred for orthopedic evaluation as an outpatient. Right lower extremity vascular study was negative for DVT. She followed up with her orthopedic surgeon Dr. Madelon Lips. Plan was to obtain MRI of low back because of suspected sciatica.  Past medical history: Right rotator cuff dysfunction, depression, glaucoma, hypertension, hypothyroidism  Consultants:  None  Procedures:  None  Antibiotics:  None  Interim History: Chart reviewed in detail. Blood pressure improved. Mild elevation of CK-MB and total CK. Troponin negative.  Subjective: Currently comfortable but did complain of hip pain earlier today.  Objective: Filed Vitals:   01/17/12 1842 01/17/12 2213 01/18/12 0610 01/18/12 0946  BP: 147/52 148/59 177/70 147/57  Pulse: 75 65 70 77  Temp: 97.5 F (36.4 C) 97.5 F (36.4 C) 98.1 F (36.7 C)   TempSrc: Oral Oral Oral   Resp: 20 20 18    Height: 5\' 1"  (1.549 m)     Weight: 58.968 kg (130 lb)     SpO2: 98% 95% 96%     Intake/Output Summary (Last 24 hours) at 01/18/12 1346 Last data filed at 01/18/12 1200  Gross per 24 hour  Intake 1699.67 ml  Output   1700 ml  Net  -0.33 ml    Exam:   General:  Calm and comfortable. Speech fluent and clear. Follows commands.  Cardiovascular: Regular rate and rhythm. No murmur, rub, gallop. No lower extremity edema.  Respiratory: To auscultation bilaterally. No wheezes, rales, rhonchi. Normal respiratory effort.  Abdomen: Soft, nontender,  nondistended.  Musculoskeletal: Excellent tone and strength in the lower extremities bilaterally.  Psychiatric: Grossly normal mood and affect.  Neurologic: Grossly nonfocal.  Data Reviewed: Basic Metabolic Panel:  Lab 01/18/12 8119 01/17/12 2226 01/17/12 1754 01/17/12 1435  NA 123* 120* 123* 119*  K 3.5 3.4* -- --  CL 92* 90* 91* 83*  CO2 23 22 23 25   GLUCOSE 93 137* 107* 128*  BUN 9 10 10 13   CREATININE 0.54 0.50 0.48*0.48* 0.55  CALCIUM 8.7 8.5 8.5 9.3  MG -- -- -- --  PHOS -- -- -- --   Liver Function Tests:  Lab 01/17/12 1435  AST 28  ALT 23  ALKPHOS 104  BILITOT 0.6  PROT 7.8  ALBUMIN 3.8   CBC:  Lab 01/18/12 0826 01/17/12 1754 01/17/12 1435  WBC 6.4 9.89.4 10.4  NEUTROABS -- -- 8.7*  HGB 10.8* 11.7*11.5* 12.2  HCT 30.7* 33.2*32.5* 34.4*  MCV 87.2 87.487.4 87.1  PLT 277 [id number] 333   Cardiac Enzymes:  Lab 01/18/12 0827 01/18/12 0005 01/17/12 1754 01/17/12 1435  CKTOTAL 266* 190* 195* --  CKMB 7.7* 7.3* 7.8* --  CKMBINDEX -- -- -- --  TROPONINI <0.30 <0.30 <0.30 <0.30   CBG:  Lab 01/18/12 0538  GLUCAP 103*     Studies: Dg Hip Complete Right  Ct Head Wo Contrast  01/17/2012  *RADIOLOGY REPORT*  Clinical Data: History of fall complaining of headaches and neck pain.  CT HEAD WITHOUT CONTRAST  Technique:  Contiguous axial images were obtained from the base of  the skull through the vertex without contrast.  Comparison: No priors.  Findings: No acute intracranial abnormalities.  Specifically, no evidence of acute intracranial hemorrhage, mass, mass effect, hydrocephalus or abnormal intra- or extra-axial fluid collections. There is moderate cerebral atrophy (age appropriate) with associated ex vacuo dilatation of the ventricular system.  Mild decreased attenuation throughout the deep and periventricular white matter of the cerebral hemispheres bilaterally is noted, likely indicative of chronic microvascular ischemic changes.  No displaced skull  fractures.  Visualized paranasal sinuses and mastoids are well pneumatized.  IMPRESSION:  1.  No acute intracranial abnormalities. 2.  Moderate (age appropriate) cerebral atrophy. 3.  Probable mild chronic microvascular ischemic changes in the deep and periventricular white matter of the cerebral hemispheres bilaterally.  Original Report Authenticated By: Florencia Reasons, M.D.   Ct Cervical Spine Wo Contrast  01/17/2012  *RADIOLOGY REPORT*  Clinical Data: History of fall complaining of neck pain.  CT CERVICAL SPINE WITHOUT CONTRAST  Technique:  Multidetector CT imaging of the cervical spine was performed. Multiplanar CT image reconstructions were also generated.  Comparison: No priors.  Findings:  No acute displaced fracture or gross malalignment of the cervical spine is noted.  There are multilevel changes of degenerative disc disease, most pronounced at C4-C5 where there is some calcification and small locules of gas within the intervertebral disc, and minimal (2 mm) anterolisthesis of C4 upon C5.  Multilevel facet arthropathy is also noted.  Prevertebral soft tissues are normal.  Visualized lung apices are remarkable for septal thickening bilaterally, which could suggest interstitial pulmonary edema.  In addition, multiple small pulmonary nodules are noted in the lungs bilaterally, predominately in the right apex where the largest nodule measures up to 5 mm in diameter (image 79 of series 7). Extensive atherosclerosis in the aortic arch and great vessels.  IMPRESSION: 1.  No acute abnormality of the cervical spine. 2.  Multilevel degenerative disc disease and cervical spondylosis, as above. 3.  Probable interstitial pulmonary edema noted in the lung apices bilaterally. 4.  Multiple pulmonary nodules visualized in the lung apices, the largest measuring 5 mm in the right apex.  Appearance is nonspecific, but is favored to be of infectious etiology (findings are asymmetric, greater on the right than on the  left), possibly chronic.  Correlation with chest radiograph or chest CT is recommended at this time.  Original Report Authenticated By: Florencia Reasons, M.D.   Mr Brain Wo Contrast  01/17/2012  *RADIOLOGY REPORT*  Clinical Data: Altered mental status.  Hip pain.  MRI HEAD WITHOUT CONTRAST  Technique:  Multiplanar, multiecho pulse sequences of the brain and surrounding structures were obtained according to standard protocol without intravenous contrast.  Comparison: CT head without contrast 01/16/2011.  Findings: The diffusion weighted images demonstrate no evidence for acute or subacute infarction.  Mild generalized atrophy is present. Mild periventricular subcortical white matter changes are evident bilaterally.  Flow is present in the major intracranial arteries. The patient is status post bilateral lens extractions.  The paranasal sinuses are clear.  A right mastoid effusion is present. No obstructing nasopharyngeal lesion is evident.  IMPRESSION:  1.  No acute intracranial abnormality. 2.  A mild generalized atrophy and diffuse white matter disease. This likely reflects the sequelae of chronic microvascular ischemia. 3.  Right mastoid effusion.  No obstructing nasopharyngeal lesion is evident.  Original Report Authenticated By: Jamesetta Orleans. MATTERN, M.D.   Mr Lumbar Spine Wo Contrast  01/17/2012  *RADIOLOGY REPORT*  Clinical Data: Fall.  Low  back and hip pain.  MRI LUMBAR SPINE WITHOUT CONTRAST  Technique:  Multiplanar and multiecho pulse sequences of the lumbar spine were obtained without intravenous contrast.  Comparison: None.  Findings: Normal signal is present in the conus medullaris which terminates at L1-2, within normal limits.  At least two cystic lesions are present within the right lobe of the liver.  These are incompletely characterized.  Chronic end plate marrow changes are present at L1-2.  Degenerative retrolisthesis at L1-2 measures 4 mm.  The disc levels at T12-L1 and above are normal.   L1-2:  Retrolisthesis results in uncovering of the disc.  Endplate osteophyte formation is present.  Mild central canal narrowing is evident.  Moderate right and mild left foraminal stenosis is present.  L2-3:  Small lateral disc protrusions are present.  Relatively short pedicles and facet hypertrophy are evident.  This results in mild foraminal stenosis bilaterally.  L3-4:  A shallow lateral disc protrusions in combination of short pedicles and facet hypertrophy results in mild foraminal narrowing bilaterally.  L4-5:  A broad-based disc herniation is present.  The disc herniates into the neural foramina bilaterally.  Mild facet hypertrophy is present.  Mild left lateral recess and foraminal narrowing is present.  L5-S1:  A broad-based disc herniation is present.  Moderate facet hypertrophy is evident.  The mild left foraminal stenosis is present.  There is mild left lateral recess narrowing as well.  IMPRESSION:  1.  Mild left lateral recess and foraminal stenosis at L4-5 and L5- S1 secondary to broad-based disc herniations and facet hypertrophy. 2.  Mild foraminal narrowing at to L2-3 and L3-4 secondary to lateral disc protrusions and short pedicles. 3.  Degenerative retrolisthesis at L1-2 with uncovering of the disc and osteophyte formation.  Mild central and bilateral foraminal stenosis is worse on the right.  Original Report Authenticated By: Jamesetta Orleans. MATTERN, M.D.   Ct Hip Right Wo Contrast  01/17/2012  *RADIOLOGY REPORT*  Clinical Data: Fall.  Right hip pain.  Hysterectomy.  CT OF THE RIGHT HIP WITHOUT CONTRAST  Technique:  Multidetector CT imaging was performed according to the standard protocol. Multiplanar CT image reconstructions were also generated.  Comparison: 01/07/2012 radiographs  Findings: The spurring of the trochanters, spurring of the trochanters and acetabulum noted.  There is atrophy of the gluteus minimus and medius, potentially with low-level edema tracking along this region of  fatty atrophy.  Edema tracks adjacent to the greater trochanter.  A definite fracture is not well observed.  IMPRESSION:  1.  Although there is some edema in the soft tissues adjacent to the hip, including along the trochanteric bursa, I do not observe a fracture.  There is some spurring of the acetabulum and proximal humerus.  If there is high clinical index of suspicion of occult fracture and if no contraindication such as pacemaker, MRI could provide greater sensitivity and negative predictive value for hip fracture.  Original Report Authenticated By: Dellia Cloud, M.D.   Dg Knee Complete 4 Views Right  01/07/2012  *RADIOLOGY REPORT*  Clinical Data: 4-day history of right knee pain.  No recent injuries.  History of arthroplasty in April, 2012.  RIGHT KNEE - COMPLETE 4+ VIEW 01/07/2012:  Comparison: None.  Findings: Right total knee arthroplasty with anatomic alignment. No periprosthetic lucency to suggest loosening or granulomatosis. Severe osteopenia.  No evidence of acute fracture.  Moderate sized joint effusion.  IMPRESSION: No acute osseous abnormality.  Right total knee arthroplasty with anatomic alignment and no complicating features.  Moderate sized joint effusion.  Severe osteopenia.  Original Report Authenticated By: Arnell Sieving, M.D.    Scheduled Meds:   . sodium chloride   Intravenous Once  . atenolol  50 mg Oral Daily  . docusate sodium  100 mg Oral BID  . enoxaparin  40 mg Subcutaneous Q24H  . fluticasone  2 spray Each Nare QHS  . levothyroxine  88 mcg Oral QAC breakfast  . LORazepam  0.5 mg Oral BID  . meloxicam  7.5 mg Oral Daily  . potassium chloride  40 mEq Oral Once  . rOPINIRole  1 mg Oral BID  . sodium chloride  3 mL Intravenous Q12H  . vitamin B-12  1,000 mcg Oral Daily  . DISCONTD: fluticasone  2 spray Each Nare Daily   Continuous Infusions:   . sodium chloride 100 mL/hr at 01/18/12 0512     Assessment/Plan: 1. Acute metabolic encephalopathy:  Thought to be secondary to hyponatremia. MRI of brain negative. 2. Hyponatremia: Modest improvement. Likely secondary to volume depletion; possibly contributed to by Celexa. Follow up urine studies. Serial BMP. TSH within normal limits. 3. Uncontrolled hypertension: Stable now. 4. Hypokalemia: Replete 5. Dehydration: IV fluids. 6. Mechanical fall: Secondary to hip pain. Her orthopedic surgeon is not working today but I did speak with his physician assistant who will pass along the above findings notify him of admission. Do not see acute need for consultation as an inpatient. 7. Right hip pain: Hip CT negative. 8. Degenerative lumbar back disease: Follow up as an outpatient. 9. Knee swelling/leg pain: Stable  Elevated CK-MB is of no clinical significance given her recent fall. Troponin is negative.  Code Status: DO NOT RESUSCITATE Family Communication: [name] [name] at [phone] Disposition Plan: Pending further evaluation and treatment.   Brendia Sacks, MD  [facility] Pager [phone] 01/18/2012, 1:46 PM    LOS: 1 day

## 2012-01-18 NOTE — Progress Notes (Signed)
*  PRELIMINARY RESULTS* Echocardiogram 2D Echocardiogram has been performed.  Glean Salen [facility] 01/18/2012, 4:08 PM

## 2012-01-19 LAB — BASIC METABOLIC PANEL
Calcium: 8.6 mg/dL (ref 8.4–10.5)
Chloride: 98 mEq/L (ref 96–112)
Creatinine, Ser: 0.64 mg/dL (ref 0.50–1.10)
GFR calc Af Amer: 88 mL/min — ABNORMAL LOW (ref >90)
GFR calc non Af Amer: 76 mL/min — ABNORMAL LOW (ref >90)

## 2012-01-19 MED ORDER — AMLODIPINE BESYLATE 5 MG PO TABS
5.0000 mg | ORAL_TABLET | Freq: Every day | ORAL | Status: DC
  Administered 2012-01-19 – 2012-01-23 (×5): 5 mg via ORAL
  Filled 2012-01-19 (×5): qty 1

## 2012-01-19 MED ORDER — ROPINIROLE HCL 1 MG PO TABS
2.0000 mg | ORAL_TABLET | Freq: Two times a day (BID) | ORAL | Status: DC
Start: 2012-01-19 — End: 2012-01-25
  Administered 2012-01-19 – 2012-01-25 (×12): 2 mg via ORAL
  Filled 2012-01-19 (×13): qty 2

## 2012-01-19 MED ORDER — ROPINIROLE HCL 1 MG PO TABS
1.0000 mg | ORAL_TABLET | Freq: Two times a day (BID) | ORAL | Status: DC
  Administered 2012-01-19 – 2012-01-25 (×13): 1 mg via ORAL
  Filled 2012-01-19 (×15): qty 1

## 2012-01-19 NOTE — Progress Notes (Signed)
She remains in no distress.  Her son visits at this time, and per his request, I facilitate a phone call with Dr. Irene Limbo.  Her son thanks both me and the doctor for the communication.  We also clarify at this time her Requip regimine per her son's request and with his clarification assistance.

## 2012-01-19 NOTE — Progress Notes (Signed)
PROGRESS NOTE  [name] [name] ZOX:[id number] DOB: [birth year] DOA: 01/17/2012 PCP: Letitia Libra, Ala Dach, MD, MD  Brief narrative: 78 year old woman with mechanical fall secondary to right hip pain. Also struck right shoulder back of head. Was noted to have a sodium of 119 and referred for admission. For the past 2 weeks she has had severe right hip pain. She is scheduled to have MRI of her lumbar spine February 8.  Seen January 28 for knee pain. X-ray demonstrated effusion. Referred for orthopedic evaluation as an outpatient. Right lower extremity vascular study was negative for DVT. She followed up with her orthopedic surgeon Dr. Madelon Lips. Plan was to obtain MRI of low back because of suspected sciatica.  Past medical history: Right rotator cuff dysfunction, depression, glaucoma, hypertension, hypothyroidism  Consultants:  Physical therapy:  Procedures:  None  Antibiotics:  None  Interim History: Interval documentation reviewed.  Subjective: Currently pain in hip is controlled.  Objective: Filed Vitals:   01/18/12 0946 01/18/12 1435 01/18/12 2137 01/19/12 0540  BP: 147/57 98/55 158/50 175/53  Pulse: 77 60 62 63  Temp:  98 F (36.7 C) 98.3 F (36.8 C) 98.2 F (36.8 C)  TempSrc:  Oral Oral Oral  Resp:  16 17 17   Height:      Weight:      SpO2:  99% 98% 97%    Intake/Output Summary (Last 24 hours) at 01/19/12 1621 Last data filed at 01/19/12 1010  Gross per 24 hour  Intake   1735 ml  Output    500 ml  Net   1235 ml    Exam:   General:  Calm and comfortable. Speech fluent and clear. Follows commands.  Cardiovascular: Regular rate and rhythm. No murmur, rub, gallop. No lower extremity edema.  Respiratory: To auscultation bilaterally. No wheezes, rales, rhonchi. Normal respiratory effort.  Musculoskeletal: Excellent tone and strength in the lower extremities bilaterally.  Psychiatric: Grossly normal mood and affect.  Neurologic: Grossly  nonfocal.  Data Reviewed: Basic Metabolic Panel:  Lab 01/19/12 8119 01/18/12 1804 01/18/12 0826 01/17/12 2226 01/17/12 1754  NA 130* 128* 123* 120* 123*  K 3.6 3.5 -- -- --  CL 98 95* 92* 90* 91*  CO2 23 22 23 22 23   GLUCOSE 82 116* 93 137* 107*  BUN 15 16 9 10 10   CREATININE 0.64 0.80 0.54 0.50 0.48*0.48*  CALCIUM 8.6 8.1* 8.7 8.5 8.5  MG -- -- -- -- --  PHOS -- -- -- -- --   Liver Function Tests:  Lab 01/17/12 1435  AST 28  ALT 23  ALKPHOS 104  BILITOT 0.6  PROT 7.8  ALBUMIN 3.8   CBC:  Lab 01/18/12 0826 01/17/12 1754 01/17/12 1435  WBC 6.4 9.89.4 10.4  NEUTROABS -- -- 8.7*  HGB 10.8* 11.7*11.5* 12.2  HCT 30.7* 33.2*32.5* 34.4*  MCV 87.2 87.487.4 87.1  PLT 277 [id number] 333   Cardiac Enzymes:  Lab 01/18/12 0827 01/18/12 0005 01/17/12 1754 01/17/12 1435  CKTOTAL 266* 190* 195* --  CKMB 7.7* 7.3* 7.8* --  CKMBINDEX -- -- -- --  TROPONINI <0.30 <0.30 <0.30 <0.30   CBG:  Lab 01/18/12 0538  GLUCAP 103*     Studies: Dg Hip Complete Right  Ct Head Wo Contrast  01/17/2012  *RADIOLOGY REPORT*  Clinical Data: History of fall complaining of headaches and neck pain.  CT HEAD WITHOUT CONTRAST  Technique:  Contiguous axial images were obtained from the base of the skull through the vertex without contrast.  Comparison: No priors.  Findings: No acute intracranial abnormalities.  Specifically, no evidence of acute intracranial hemorrhage, mass, mass effect, hydrocephalus or abnormal intra- or extra-axial fluid collections. There is moderate cerebral atrophy (age appropriate) with associated ex vacuo dilatation of the ventricular system.  Mild decreased attenuation throughout the deep and periventricular white matter of the cerebral hemispheres bilaterally is noted, likely indicative of chronic microvascular ischemic changes.  No displaced skull fractures.  Visualized paranasal sinuses and mastoids are well pneumatized.  IMPRESSION:  1.  No acute intracranial abnormalities.  2.  Moderate (age appropriate) cerebral atrophy. 3.  Probable mild chronic microvascular ischemic changes in the deep and periventricular white matter of the cerebral hemispheres bilaterally.  Original Report Authenticated By: Florencia Reasons, M.D.   Ct Cervical Spine Wo Contrast  01/17/2012  *RADIOLOGY REPORT*  Clinical Data: History of fall complaining of neck pain.  CT CERVICAL SPINE WITHOUT CONTRAST  Technique:  Multidetector CT imaging of the cervical spine was performed. Multiplanar CT image reconstructions were also generated.  Comparison: No priors.  Findings:  No acute displaced fracture or gross malalignment of the cervical spine is noted.  There are multilevel changes of degenerative disc disease, most pronounced at C4-C5 where there is some calcification and small locules of gas within the intervertebral disc, and minimal (2 mm) anterolisthesis of C4 upon C5.  Multilevel facet arthropathy is also noted.  Prevertebral soft tissues are normal.  Visualized lung apices are remarkable for septal thickening bilaterally, which could suggest interstitial pulmonary edema.  In addition, multiple small pulmonary nodules are noted in the lungs bilaterally, predominately in the right apex where the largest nodule measures up to 5 mm in diameter (image 79 of series 7). Extensive atherosclerosis in the aortic arch and great vessels.  IMPRESSION: 1.  No acute abnormality of the cervical spine. 2.  Multilevel degenerative disc disease and cervical spondylosis, as above. 3.  Probable interstitial pulmonary edema noted in the lung apices bilaterally. 4.  Multiple pulmonary nodules visualized in the lung apices, the largest measuring 5 mm in the right apex.  Appearance is nonspecific, but is favored to be of infectious etiology (findings are asymmetric, greater on the right than on the left), possibly chronic.  Correlation with chest radiograph or chest CT is recommended at this time.  Original Report Authenticated By:  Florencia Reasons, M.D.   Mr Brain Wo Contrast  01/17/2012  *RADIOLOGY REPORT*  Clinical Data: Altered mental status.  Hip pain.  MRI HEAD WITHOUT CONTRAST  Technique:  Multiplanar, multiecho pulse sequences of the brain and surrounding structures were obtained according to standard protocol without intravenous contrast.  Comparison: CT head without contrast 01/16/2011.  Findings: The diffusion weighted images demonstrate no evidence for acute or subacute infarction.  Mild generalized atrophy is present. Mild periventricular subcortical white matter changes are evident bilaterally.  Flow is present in the major intracranial arteries. The patient is status post bilateral lens extractions.  The paranasal sinuses are clear.  A right mastoid effusion is present. No obstructing nasopharyngeal lesion is evident.  IMPRESSION:  1.  No acute intracranial abnormality. 2.  A mild generalized atrophy and diffuse white matter disease. This likely reflects the sequelae of chronic microvascular ischemia. 3.  Right mastoid effusion.  No obstructing nasopharyngeal lesion is evident.  Original Report Authenticated By: Jamesetta Orleans. MATTERN, M.D.   Mr Lumbar Spine Wo Contrast  01/17/2012  *RADIOLOGY REPORT*  Clinical Data: Fall.  Low back and hip pain.  MRI LUMBAR SPINE WITHOUT CONTRAST  Technique:  Multiplanar and multiecho pulse sequences of the lumbar spine were obtained without intravenous contrast.  Comparison: None.  Findings: Normal signal is present in the conus medullaris which terminates at L1-2, within normal limits.  At least two cystic lesions are present within the right lobe of the liver.  These are incompletely characterized.  Chronic end plate marrow changes are present at L1-2.  Degenerative retrolisthesis at L1-2 measures 4 mm.  The disc levels at T12-L1 and above are normal.  L1-2:  Retrolisthesis results in uncovering of the disc.  Endplate osteophyte formation is present.  Mild central canal narrowing is  evident.  Moderate right and mild left foraminal stenosis is present.  L2-3:  Small lateral disc protrusions are present.  Relatively short pedicles and facet hypertrophy are evident.  This results in mild foraminal stenosis bilaterally.  L3-4:  A shallow lateral disc protrusions in combination of short pedicles and facet hypertrophy results in mild foraminal narrowing bilaterally.  L4-5:  A broad-based disc herniation is present.  The disc herniates into the neural foramina bilaterally.  Mild facet hypertrophy is present.  Mild left lateral recess and foraminal narrowing is present.  L5-S1:  A broad-based disc herniation is present.  Moderate facet hypertrophy is evident.  The mild left foraminal stenosis is present.  There is mild left lateral recess narrowing as well.  IMPRESSION:  1.  Mild left lateral recess and foraminal stenosis at L4-5 and L5- S1 secondary to broad-based disc herniations and facet hypertrophy. 2.  Mild foraminal narrowing at to L2-3 and L3-4 secondary to lateral disc protrusions and short pedicles. 3.  Degenerative retrolisthesis at L1-2 with uncovering of the disc and osteophyte formation.  Mild central and bilateral foraminal stenosis is worse on the right.  Original Report Authenticated By: Jamesetta Orleans. MATTERN, M.D.   Ct Hip Right Wo Contrast  01/17/2012  *RADIOLOGY REPORT*  Clinical Data: Fall.  Right hip pain.  Hysterectomy.  CT OF THE RIGHT HIP WITHOUT CONTRAST  Technique:  Multidetector CT imaging was performed according to the standard protocol. Multiplanar CT image reconstructions were also generated.  Comparison: 01/07/2012 radiographs  Findings: The spurring of the trochanters, spurring of the trochanters and acetabulum noted.  There is atrophy of the gluteus minimus and medius, potentially with low-level edema tracking along this region of fatty atrophy.  Edema tracks adjacent to the greater trochanter.  A definite fracture is not well observed.  IMPRESSION:  1.  Although  there is some edema in the soft tissues adjacent to the hip, including along the trochanteric bursa, I do not observe a fracture.  There is some spurring of the acetabulum and proximal humerus.  If there is high clinical index of suspicion of occult fracture and if no contraindication such as pacemaker, MRI could provide greater sensitivity and negative predictive value for hip fracture.  Original Report Authenticated By: Dellia Cloud, M.D.   Dg Knee Complete 4 Views Right  01/07/2012  *RADIOLOGY REPORT*  Clinical Data: 4-day history of right knee pain.  No recent injuries.  History of arthroplasty in April, 2012.  RIGHT KNEE - COMPLETE 4+ VIEW 01/07/2012:  Comparison: None.  Findings: Right total knee arthroplasty with anatomic alignment. No periprosthetic lucency to suggest loosening or granulomatosis. Severe osteopenia.  No evidence of acute fracture.  Moderate sized joint effusion.  IMPRESSION: No acute osseous abnormality.  Right total knee arthroplasty with anatomic alignment and no complicating features.  Moderate sized joint effusion.  Severe osteopenia.  Original Report Authenticated By:  Arnell Sieving, M.D.    Scheduled Meds:    . atenolol  50 mg Oral Daily  . docusate sodium  100 mg Oral BID  . enoxaparin  40 mg Subcutaneous Q24H  . fluticasone  2 spray Each Nare QHS  . levothyroxine  88 mcg Oral QAC breakfast  . LORazepam  0.5 mg Oral BID  . meloxicam  7.5 mg Oral Daily  . potassium chloride  40 mEq Oral Once  . rOPINIRole  1 mg Oral BID  . rOPINIRole  2 mg Oral BID  . sodium chloride  3 mL Intravenous Q12H  . vitamin B-12  1,000 mcg Oral Daily  . DISCONTD: sodium chloride   Intravenous Once  . DISCONTD: rOPINIRole  1 mg Oral BID  . DISCONTD: rOPINIRole  1 mg Oral BID   Continuous Infusions:    . sodium chloride 100 mL/hr at 01/19/12 1601     Assessment/Plan: 1. Acute metabolic encephalopathy: Resolved. Likely secondary to hyponatremia. MRI of brain  negative. 2. Hyponatremia: Resolving. Secondary to volume depletion; possibly contributed to by Celexa. Follow up urine studies. Serial BMP. TSH within normal limits. 3. Uncontrolled hypertension: Poor control. Continue atenolol. Add Norvasc. 4. Hypokalemia: Repleted. 5. Dehydration: Resolved. 6. Mechanical fall: Secondary to hip pain. Lumbar spine films noted. Degenerative disease. Physical therapy and pain control. May need skilled nursing facility prior to discharge back to assisted living facility. 7. Right hip pain: Hip CT negative. Followup with orthopedic surgeon as an outpatient. 8. Degenerative lumbar back disease: Follow up as an outpatient. Pain control and physical therapy. 9. Knee swelling/leg pain: Stable  Discussed with his son by telephone today. All questions answered to his apparent satisfaction.  Code Status: DO NOT RESUSCITATE Family Communication: Rondell Reams at [phone] Disposition Plan: Pending further evaluation and treatment.   Brendia Sacks, MD  [facility] Pager [phone] 01/19/2012, 4:21 PM    LOS: 2 days

## 2012-01-20 LAB — BASIC METABOLIC PANEL
Calcium: 8.3 mg/dL — ABNORMAL LOW (ref 8.4–10.5)
Creatinine, Ser: 0.55 mg/dL (ref 0.50–1.10)
GFR calc Af Amer: >90 mL/min (ref >90)
GFR calc non Af Amer: 80 mL/min — ABNORMAL LOW (ref >90)
Sodium: 131 mEq/L — ABNORMAL LOW (ref 135–145)

## 2012-01-20 MED ORDER — POTASSIUM CHLORIDE CRYS ER 20 MEQ PO TBCR
40.0000 meq | EXTENDED_RELEASE_TABLET | Freq: Two times a day (BID) | ORAL | Status: DC
  Administered 2012-01-20 – 2012-01-23 (×6): 40 meq via ORAL
  Filled 2012-01-20 (×8): qty 2

## 2012-01-20 MED ORDER — MORPHINE SULFATE CR 15 MG PO TB12
15.0000 mg | ORAL_TABLET | Freq: Two times a day (BID) | ORAL | Status: DC
  Administered 2012-01-20 – 2012-01-21 (×3): 15 mg via ORAL
  Filled 2012-01-20 (×3): qty 1

## 2012-01-20 NOTE — Progress Notes (Signed)
PROGRESS NOTE  [name] [name] ZOX:[id number] DOB: 1919/10/07 DOA: 01/17/2012 PCP: Letitia Libra, Ala Dach, MD, MD  Brief narrative: 78 year old woman with mechanical fall secondary to right hip pain. Also struck right shoulder back of head. Was noted to have a sodium of 119 and referred for admission. For the past 2 weeks she has had severe right hip pain. She is scheduled to have MRI of her lumbar spine February 8.  Seen January 28 for knee pain. X-ray demonstrated effusion. Referred for orthopedic evaluation as an outpatient. Right lower extremity vascular study was negative for DVT. She followed up with her orthopedic surgeon Dr. Madelon Lips. Plan was to obtain MRI of low back because of suspected sciatica.  Past medical history: Right rotator cuff dysfunction, depression, glaucoma, hypertension, hypothyroidism  Consultants:  Physical therapy:  Procedures:  None  Antibiotics:  None  Interim History: Interval documentation reviewed. Blood pressure high.  Subjective: Complains of pain in the hip. Son at bedside.  Objective: Filed Vitals:   01/20/12 0700 01/20/12 1430 01/20/12 1519 01/20/12 1600  BP: 180/70 109/65 122/35 124/68  Pulse:   65   Temp:   97.9 F (36.6 C)   TempSrc:   Oral   Resp:   20   Height:      Weight:      SpO2:   97%     Intake/Output Summary (Last 24 hours) at 01/20/12 1638 Last data filed at 01/20/12 1400  Gross per 24 hour  Intake   1790 ml  Output   2500 ml  Net   -710 ml    Exam:   General:  Calm and comfortable. Speech fluent and clear. Follows commands.  Cardiovascular: Regular rate and rhythm. No murmur, rub, gallop. No lower extremity edema.  Respiratory: To auscultation bilaterally. No wheezes, rales, rhonchi. Normal respiratory effort.  Musculoskeletal: Excellent tone and strength in the lower extremities bilaterally.  Psychiatric: Grossly normal mood and affect.  Data Reviewed: Basic Metabolic Panel:  Lab 01/20/12 8119  01/19/12 0530 01/18/12 1804 01/18/12 0826 01/17/12 2226  NA 131* 130* 128* 123* 120*  K 3.2* 3.6 -- -- --  CL 98 98 95* 92* 90*  CO2 24 23 22 23 22   GLUCOSE 86 82 116* 93 137*  BUN 8 15 16 9 10   CREATININE 0.55 0.64 0.80 0.54 0.50  CALCIUM 8.3* 8.6 8.1* 8.7 8.5  MG -- -- -- -- --  PHOS -- -- -- -- --   Liver Function Tests:  Lab 01/17/12 1435  AST 28  ALT 23  ALKPHOS 104  BILITOT 0.6  PROT 7.8  ALBUMIN 3.8   CBC:  Lab 01/18/12 0826 01/17/12 1754 01/17/12 1435  WBC 6.4 9.89.4 10.4  NEUTROABS -- -- 8.7*  HGB 10.8* 11.7*11.5* 12.2  HCT 30.7* 33.2*32.5* 34.4*  MCV 87.2 87.487.4 87.1  PLT 277 [id number] 333   Cardiac Enzymes:  Lab 01/18/12 0827 01/18/12 0005 01/17/12 1754 01/17/12 1435  CKTOTAL 266* 190* 195* --  CKMB 7.7* 7.3* 7.8* --  CKMBINDEX -- -- -- --  TROPONINI <0.30 <0.30 <0.30 <0.30   CBG:  Lab 01/18/12 0538  GLUCAP 103*     Studies: Dg Hip Complete Right  Ct Head Wo Contrast  01/17/2012  *RADIOLOGY REPORT*  Clinical Data: History of fall complaining of headaches and neck pain.  CT HEAD WITHOUT CONTRAST  Technique:  Contiguous axial images were obtained from the base of the skull through the vertex without contrast.  Comparison: No priors.  Findings: No acute intracranial  abnormalities.  Specifically, no evidence of acute intracranial hemorrhage, mass, mass effect, hydrocephalus or abnormal intra- or extra-axial fluid collections. There is moderate cerebral atrophy (age appropriate) with associated ex vacuo dilatation of the ventricular system.  Mild decreased attenuation throughout the deep and periventricular white matter of the cerebral hemispheres bilaterally is noted, likely indicative of chronic microvascular ischemic changes.  No displaced skull fractures.  Visualized paranasal sinuses and mastoids are well pneumatized.  IMPRESSION:  1.  No acute intracranial abnormalities. 2.  Moderate (age appropriate) cerebral atrophy. 3.  Probable mild chronic  microvascular ischemic changes in the deep and periventricular white matter of the cerebral hemispheres bilaterally.  Original Report Authenticated By: Florencia Reasons, M.D.   Ct Cervical Spine Wo Contrast  01/17/2012  *RADIOLOGY REPORT*  Clinical Data: History of fall complaining of neck pain.  CT CERVICAL SPINE WITHOUT CONTRAST  Technique:  Multidetector CT imaging of the cervical spine was performed. Multiplanar CT image reconstructions were also generated.  Comparison: No priors.  Findings:  No acute displaced fracture or gross malalignment of the cervical spine is noted.  There are multilevel changes of degenerative disc disease, most pronounced at C4-C5 where there is some calcification and small locules of gas within the intervertebral disc, and minimal (2 mm) anterolisthesis of C4 upon C5.  Multilevel facet arthropathy is also noted.  Prevertebral soft tissues are normal.  Visualized lung apices are remarkable for septal thickening bilaterally, which could suggest interstitial pulmonary edema.  In addition, multiple small pulmonary nodules are noted in the lungs bilaterally, predominately in the right apex where the largest nodule measures up to 5 mm in diameter (image 79 of series 7). Extensive atherosclerosis in the aortic arch and great vessels.  IMPRESSION: 1.  No acute abnormality of the cervical spine. 2.  Multilevel degenerative disc disease and cervical spondylosis, as above. 3.  Probable interstitial pulmonary edema noted in the lung apices bilaterally. 4.  Multiple pulmonary nodules visualized in the lung apices, the largest measuring 5 mm in the right apex.  Appearance is nonspecific, but is favored to be of infectious etiology (findings are asymmetric, greater on the right than on the left), possibly chronic.  Correlation with chest radiograph or chest CT is recommended at this time.  Original Report Authenticated By: Florencia Reasons, M.D.   Mr Brain Wo Contrast  01/17/2012  *RADIOLOGY  REPORT*  Clinical Data: Altered mental status.  Hip pain.  MRI HEAD WITHOUT CONTRAST  Technique:  Multiplanar, multiecho pulse sequences of the brain and surrounding structures were obtained according to standard protocol without intravenous contrast.  Comparison: CT head without contrast 01/16/2011.  Findings: The diffusion weighted images demonstrate no evidence for acute or subacute infarction.  Mild generalized atrophy is present. Mild periventricular subcortical white matter changes are evident bilaterally.  Flow is present in the major intracranial arteries. The patient is status post bilateral lens extractions.  The paranasal sinuses are clear.  A right mastoid effusion is present. No obstructing nasopharyngeal lesion is evident.  IMPRESSION:  1.  No acute intracranial abnormality. 2.  A mild generalized atrophy and diffuse white matter disease. This likely reflects the sequelae of chronic microvascular ischemia. 3.  Right mastoid effusion.  No obstructing nasopharyngeal lesion is evident.  Original Report Authenticated By: Jamesetta Orleans. MATTERN, M.D.   Mr Lumbar Spine Wo Contrast  01/17/2012  *RADIOLOGY REPORT*  Clinical Data: Fall.  Low back and hip pain.  MRI LUMBAR SPINE WITHOUT CONTRAST  Technique:  Multiplanar and multiecho  pulse sequences of the lumbar spine were obtained without intravenous contrast.  Comparison: None.  Findings: Normal signal is present in the conus medullaris which terminates at L1-2, within normal limits.  At least two cystic lesions are present within the right lobe of the liver.  These are incompletely characterized.  Chronic end plate marrow changes are present at L1-2.  Degenerative retrolisthesis at L1-2 measures 4 mm.  The disc levels at T12-L1 and above are normal.  L1-2:  Retrolisthesis results in uncovering of the disc.  Endplate osteophyte formation is present.  Mild central canal narrowing is evident.  Moderate right and mild left foraminal stenosis is present.  L2-3:   Small lateral disc protrusions are present.  Relatively short pedicles and facet hypertrophy are evident.  This results in mild foraminal stenosis bilaterally.  L3-4:  A shallow lateral disc protrusions in combination of short pedicles and facet hypertrophy results in mild foraminal narrowing bilaterally.  L4-5:  A broad-based disc herniation is present.  The disc herniates into the neural foramina bilaterally.  Mild facet hypertrophy is present.  Mild left lateral recess and foraminal narrowing is present.  L5-S1:  A broad-based disc herniation is present.  Moderate facet hypertrophy is evident.  The mild left foraminal stenosis is present.  There is mild left lateral recess narrowing as well.  IMPRESSION:  1.  Mild left lateral recess and foraminal stenosis at L4-5 and L5- S1 secondary to broad-based disc herniations and facet hypertrophy. 2.  Mild foraminal narrowing at to L2-3 and L3-4 secondary to lateral disc protrusions and short pedicles. 3.  Degenerative retrolisthesis at L1-2 with uncovering of the disc and osteophyte formation.  Mild central and bilateral foraminal stenosis is worse on the right.  Original Report Authenticated By: Jamesetta Orleans. MATTERN, M.D.   Ct Hip Right Wo Contrast  01/17/2012  *RADIOLOGY REPORT*  Clinical Data: Fall.  Right hip pain.  Hysterectomy.  CT OF THE RIGHT HIP WITHOUT CONTRAST  Technique:  Multidetector CT imaging was performed according to the standard protocol. Multiplanar CT image reconstructions were also generated.  Comparison: 01/07/2012 radiographs  Findings: The spurring of the trochanters, spurring of the trochanters and acetabulum noted.  There is atrophy of the gluteus minimus and medius, potentially with low-level edema tracking along this region of fatty atrophy.  Edema tracks adjacent to the greater trochanter.  A definite fracture is not well observed.  IMPRESSION:  1.  Although there is some edema in the soft tissues adjacent to the hip, including along  the trochanteric bursa, I do not observe a fracture.  There is some spurring of the acetabulum and proximal humerus.  If there is high clinical index of suspicion of occult fracture and if no contraindication such as pacemaker, MRI could provide greater sensitivity and negative predictive value for hip fracture.  Original Report Authenticated By: Dellia Cloud, M.D.   Dg Knee Complete 4 Views Right  01/07/2012  *RADIOLOGY REPORT*  Clinical Data: 4-day history of right knee pain.  No recent injuries.  History of arthroplasty in April, 2012.  RIGHT KNEE - COMPLETE 4+ VIEW 01/07/2012:  Comparison: None.  Findings: Right total knee arthroplasty with anatomic alignment. No periprosthetic lucency to suggest loosening or granulomatosis. Severe osteopenia.  No evidence of acute fracture.  Moderate sized joint effusion.  IMPRESSION: No acute osseous abnormality.  Right total knee arthroplasty with anatomic alignment and no complicating features.  Moderate sized joint effusion.  Severe osteopenia.  Original Report Authenticated By: Arnell Sieving,  M.D.    Scheduled Meds:    . amLODipine  5 mg Oral Daily  . atenolol  50 mg Oral Daily  . docusate sodium  100 mg Oral BID  . enoxaparin  40 mg Subcutaneous Q24H  . fluticasone  2 spray Each Nare QHS  . levothyroxine  88 mcg Oral QAC breakfast  . LORazepam  0.5 mg Oral BID  . meloxicam  7.5 mg Oral Daily  . morphine  15 mg Oral Q12H  . potassium chloride  40 mEq Oral Once  . rOPINIRole  1 mg Oral BID  . rOPINIRole  2 mg Oral BID  . sodium chloride  3 mL Intravenous Q12H  . vitamin B-12  1,000 mcg Oral Daily   Continuous Infusions:    . DISCONTD: sodium chloride 100 mL/hr at 01/20/12 0203     Assessment/Plan: 1. Acute metabolic encephalopathy: Resolved. Likely secondary to hyponatremia. MRI of brain negative. 2. Hyponatremia: Resolving. Secondary to volume depletion; doubt contributed to by Celexa. Serial BMP. TSH within normal  limits. 3. Uncontrolled hypertension: Labile. Probably secondary to pain. Continue atenolol, Norvasc. 4. Hypokalemia: Replete. 5. Dehydration: Resolved. 6. Mechanical fall: Secondary to hip pain. Lumbar spine films noted. Degenerative disease. Physical therapy and pain control. May need skilled nursing facility prior to discharge back to assisted living facility. 7. Right hip pain: Hip CT negative. Followup with orthopedic surgeon as an outpatient. Start long-acting narcotics. Physical therapy. 8. Degenerative lumbar back disease: Follow up as an outpatient. Pain control and physical therapy. 9. Knee swelling/leg pain: Stable  Discussed with son at bedside today.  Code Status: DO NOT RESUSCITATE Family Communication: Rondell Reams at [phone] Disposition Plan: Pending further evaluation and treatment.   Brendia Sacks, MD  [facility] Pager [phone] 01/20/2012, 4:38 PM    LOS: 3 days

## 2012-01-20 NOTE — Progress Notes (Signed)
CSW spoke with patient & son, [name] [name] (home#: [phone] cell#: [phone]) re: discharge plans. Patient had been living at [facility], awaiting PT evaluation for disposition - return to ALF or need SNF. FL2 completed, CSW to follow-up.   Unice Bailey, LCSWA [phone]

## 2012-01-20 NOTE — Progress Notes (Signed)
PROGRESS NOTE  [name] [name] UJW:[id number] DOB: Jul 03, 1919 DOA: 01/17/2012 PCP: Letitia Libra, Ala Dach, MD, MD  Brief narrative: 78 year old woman with mechanical fall secondary to right hip pain. Also struck right shoulder back of head. Was noted to have a sodium of 119 and referred for admission. For the past 2 weeks she has had severe right hip pain. She is scheduled to have MRI of her lumbar spine February 8.  Seen January 28 for knee pain. X-ray demonstrated effusion. Referred for orthopedic evaluation as an outpatient. Right lower extremity vascular study was negative for DVT. She followed up with her orthopedic surgeon Dr. Madelon Lips. Plan was to obtain MRI of low back because of suspected sciatica.  Past medical history: Right rotator cuff dysfunction, depression, glaucoma, hypertension, hypothyroidism  Consultants:  Physical therapy:  Procedures:  None  Antibiotics:  None  Interim History: Interval documentation reviewed. Blood pressure high.  Subjective: Currently pain in hip is controlled.  Objective: Filed Vitals:   01/19/12 2206 01/20/12 0628 01/20/12 0644 01/20/12 0700  BP: 156/46 182/80 184/80 180/70  Pulse: 58 58 68   Temp: 98 F (36.7 C) 97.9 F (36.6 C)    TempSrc: Oral Oral    Resp: 18 20    Height:      Weight:      SpO2: 99% 96%      Intake/Output Summary (Last 24 hours) at 01/20/12 1054 Last data filed at 01/20/12 0651  Gross per 24 hour  Intake   1550 ml  Output   2500 ml  Net   -950 ml    Exam:   General:  Calm and comfortable. Speech fluent and clear. Follows commands.  Cardiovascular: Regular rate and rhythm. No murmur, rub, gallop. No lower extremity edema.  Respiratory: To auscultation bilaterally. No wheezes, rales, rhonchi. Normal respiratory effort.  Musculoskeletal: Excellent tone and strength in the lower extremities bilaterally.  Psychiatric: Grossly normal mood and affect.  Neurologic: Grossly nonfocal.  Data  Reviewed: Basic Metabolic Panel:  Lab 01/20/12 5621 01/19/12 0530 01/18/12 1804 01/18/12 0826 01/17/12 2226  NA 131* 130* 128* 123* 120*  K 3.2* 3.6 -- -- --  CL 98 98 95* 92* 90*  CO2 24 23 22 23 22   GLUCOSE 86 82 116* 93 137*  BUN 8 15 16 9 10   CREATININE 0.55 0.64 0.80 0.54 0.50  CALCIUM 8.3* 8.6 8.1* 8.7 8.5  MG -- -- -- -- --  PHOS -- -- -- -- --   Liver Function Tests:  Lab 01/17/12 1435  AST 28  ALT 23  ALKPHOS 104  BILITOT 0.6  PROT 7.8  ALBUMIN 3.8   CBC:  Lab 01/18/12 0826 01/17/12 1754 01/17/12 1435  WBC 6.4 9.89.4 10.4  NEUTROABS -- -- 8.7*  HGB 10.8* 11.7*11.5* 12.2  HCT 30.7* 33.2*32.5* 34.4*  MCV 87.2 87.487.4 87.1  PLT 277 [id number] 333   Cardiac Enzymes:  Lab 01/18/12 0827 01/18/12 0005 01/17/12 1754 01/17/12 1435  CKTOTAL 266* 190* 195* --  CKMB 7.7* 7.3* 7.8* --  CKMBINDEX -- -- -- --  TROPONINI <0.30 <0.30 <0.30 <0.30   CBG:  Lab 01/18/12 0538  GLUCAP 103*     Studies: Dg Hip Complete Right  Ct Head Wo Contrast  01/17/2012  *RADIOLOGY REPORT*  Clinical Data: History of fall complaining of headaches and neck pain.  CT HEAD WITHOUT CONTRAST  Technique:  Contiguous axial images were obtained from the base of the skull through the vertex without contrast.  Comparison: No priors.  Findings: No acute intracranial abnormalities.  Specifically, no evidence of acute intracranial hemorrhage, mass, mass effect, hydrocephalus or abnormal intra- or extra-axial fluid collections. There is moderate cerebral atrophy (age appropriate) with associated ex vacuo dilatation of the ventricular system.  Mild decreased attenuation throughout the deep and periventricular white matter of the cerebral hemispheres bilaterally is noted, likely indicative of chronic microvascular ischemic changes.  No displaced skull fractures.  Visualized paranasal sinuses and mastoids are well pneumatized.  IMPRESSION:  1.  No acute intracranial abnormalities. 2.  Moderate (age  appropriate) cerebral atrophy. 3.  Probable mild chronic microvascular ischemic changes in the deep and periventricular white matter of the cerebral hemispheres bilaterally.  Original Report Authenticated By: Florencia Reasons, M.D.   Ct Cervical Spine Wo Contrast  01/17/2012  *RADIOLOGY REPORT*  Clinical Data: History of fall complaining of neck pain.  CT CERVICAL SPINE WITHOUT CONTRAST  Technique:  Multidetector CT imaging of the cervical spine was performed. Multiplanar CT image reconstructions were also generated.  Comparison: No priors.  Findings:  No acute displaced fracture or gross malalignment of the cervical spine is noted.  There are multilevel changes of degenerative disc disease, most pronounced at C4-C5 where there is some calcification and small locules of gas within the intervertebral disc, and minimal (2 mm) anterolisthesis of C4 upon C5.  Multilevel facet arthropathy is also noted.  Prevertebral soft tissues are normal.  Visualized lung apices are remarkable for septal thickening bilaterally, which could suggest interstitial pulmonary edema.  In addition, multiple small pulmonary nodules are noted in the lungs bilaterally, predominately in the right apex where the largest nodule measures up to 5 mm in diameter (image 79 of series 7). Extensive atherosclerosis in the aortic arch and great vessels.  IMPRESSION: 1.  No acute abnormality of the cervical spine. 2.  Multilevel degenerative disc disease and cervical spondylosis, as above. 3.  Probable interstitial pulmonary edema noted in the lung apices bilaterally. 4.  Multiple pulmonary nodules visualized in the lung apices, the largest measuring 5 mm in the right apex.  Appearance is nonspecific, but is favored to be of infectious etiology (findings are asymmetric, greater on the right than on the left), possibly chronic.  Correlation with chest radiograph or chest CT is recommended at this time.  Original Report Authenticated By: Florencia Reasons, M.D.   Mr Brain Wo Contrast  01/17/2012  *RADIOLOGY REPORT*  Clinical Data: Altered mental status.  Hip pain.  MRI HEAD WITHOUT CONTRAST  Technique:  Multiplanar, multiecho pulse sequences of the brain and surrounding structures were obtained according to standard protocol without intravenous contrast.  Comparison: CT head without contrast 01/16/2011.  Findings: The diffusion weighted images demonstrate no evidence for acute or subacute infarction.  Mild generalized atrophy is present. Mild periventricular subcortical white matter changes are evident bilaterally.  Flow is present in the major intracranial arteries. The patient is status post bilateral lens extractions.  The paranasal sinuses are clear.  A right mastoid effusion is present. No obstructing nasopharyngeal lesion is evident.  IMPRESSION:  1.  No acute intracranial abnormality. 2.  A mild generalized atrophy and diffuse white matter disease. This likely reflects the sequelae of chronic microvascular ischemia. 3.  Right mastoid effusion.  No obstructing nasopharyngeal lesion is evident.  Original Report Authenticated By: Jamesetta Orleans. MATTERN, M.D.   Mr Lumbar Spine Wo Contrast  01/17/2012  *RADIOLOGY REPORT*  Clinical Data: Fall.  Low back and hip pain.  MRI LUMBAR SPINE WITHOUT CONTRAST  Technique:  Multiplanar and multiecho pulse sequences of the lumbar spine were obtained without intravenous contrast.  Comparison: None.  Findings: Normal signal is present in the conus medullaris which terminates at L1-2, within normal limits.  At least two cystic lesions are present within the right lobe of the liver.  These are incompletely characterized.  Chronic end plate marrow changes are present at L1-2.  Degenerative retrolisthesis at L1-2 measures 4 mm.  The disc levels at T12-L1 and above are normal.  L1-2:  Retrolisthesis results in uncovering of the disc.  Endplate osteophyte formation is present.  Mild central canal narrowing is evident.   Moderate right and mild left foraminal stenosis is present.  L2-3:  Small lateral disc protrusions are present.  Relatively short pedicles and facet hypertrophy are evident.  This results in mild foraminal stenosis bilaterally.  L3-4:  A shallow lateral disc protrusions in combination of short pedicles and facet hypertrophy results in mild foraminal narrowing bilaterally.  L4-5:  A broad-based disc herniation is present.  The disc herniates into the neural foramina bilaterally.  Mild facet hypertrophy is present.  Mild left lateral recess and foraminal narrowing is present.  L5-S1:  A broad-based disc herniation is present.  Moderate facet hypertrophy is evident.  The mild left foraminal stenosis is present.  There is mild left lateral recess narrowing as well.  IMPRESSION:  1.  Mild left lateral recess and foraminal stenosis at L4-5 and L5- S1 secondary to broad-based disc herniations and facet hypertrophy. 2.  Mild foraminal narrowing at to L2-3 and L3-4 secondary to lateral disc protrusions and short pedicles. 3.  Degenerative retrolisthesis at L1-2 with uncovering of the disc and osteophyte formation.  Mild central and bilateral foraminal stenosis is worse on the right.  Original Report Authenticated By: Jamesetta Orleans. MATTERN, M.D.   Ct Hip Right Wo Contrast  01/17/2012  *RADIOLOGY REPORT*  Clinical Data: Fall.  Right hip pain.  Hysterectomy.  CT OF THE RIGHT HIP WITHOUT CONTRAST  Technique:  Multidetector CT imaging was performed according to the standard protocol. Multiplanar CT image reconstructions were also generated.  Comparison: 01/07/2012 radiographs  Findings: The spurring of the trochanters, spurring of the trochanters and acetabulum noted.  There is atrophy of the gluteus minimus and medius, potentially with low-level edema tracking along this region of fatty atrophy.  Edema tracks adjacent to the greater trochanter.  A definite fracture is not well observed.  IMPRESSION:  1.  Although there is  some edema in the soft tissues adjacent to the hip, including along the trochanteric bursa, I do not observe a fracture.  There is some spurring of the acetabulum and proximal humerus.  If there is high clinical index of suspicion of occult fracture and if no contraindication such as pacemaker, MRI could provide greater sensitivity and negative predictive value for hip fracture.  Original Report Authenticated By: Dellia Cloud, M.D.   Dg Knee Complete 4 Views Right  01/07/2012  *RADIOLOGY REPORT*  Clinical Data: 4-day history of right knee pain.  No recent injuries.  History of arthroplasty in April, 2012.  RIGHT KNEE - COMPLETE 4+ VIEW 01/07/2012:  Comparison: None.  Findings: Right total knee arthroplasty with anatomic alignment. No periprosthetic lucency to suggest loosening or granulomatosis. Severe osteopenia.  No evidence of acute fracture.  Moderate sized joint effusion.  IMPRESSION: No acute osseous abnormality.  Right total knee arthroplasty with anatomic alignment and no complicating features.  Moderate sized joint effusion.  Severe osteopenia.  Original Report Authenticated By:  Arnell Sieving, M.D.    Scheduled Meds:    . amLODipine  5 mg Oral Daily  . atenolol  50 mg Oral Daily  . docusate sodium  100 mg Oral BID  . enoxaparin  40 mg Subcutaneous Q24H  . fluticasone  2 spray Each Nare QHS  . levothyroxine  88 mcg Oral QAC breakfast  . LORazepam  0.5 mg Oral BID  . meloxicam  7.5 mg Oral Daily  . potassium chloride  40 mEq Oral Once  . rOPINIRole  1 mg Oral BID  . rOPINIRole  2 mg Oral BID  . sodium chloride  3 mL Intravenous Q12H  . vitamin B-12  1,000 mcg Oral Daily  . DISCONTD: rOPINIRole  1 mg Oral BID   Continuous Infusions:    . sodium chloride 100 mL/hr at 01/20/12 0203     Assessment/Plan: 1. Acute metabolic encephalopathy: Resolved. Likely secondary to hyponatremia. MRI of brain negative. 2. Hyponatremia: Resolving. Secondary to volume depletion;  possibly contributed to by Celexa. Follow up urine studies. Serial BMP. TSH within normal limits. 3. Uncontrolled hypertension: Poor control. Continue atenolol. Add Norvasc. 4. Hypokalemia: Repleted. 5. Dehydration: Resolved. 6. Mechanical fall: Secondary to hip pain. Lumbar spine films noted. Degenerative disease. Physical therapy and pain control. May need skilled nursing facility prior to discharge back to assisted living facility. 7. Right hip pain: Hip CT negative. Followup with orthopedic surgeon as an outpatient. 8. Degenerative lumbar back disease: Follow up as an outpatient. Pain control and physical therapy. 9. Knee swelling/leg pain: Stable  Discussed with his son by telephone today. All questions answered to his apparent satisfaction.  Code Status: DO NOT RESUSCITATE Family Communication: Rondell Reams at [phone] Disposition Plan: Pending further evaluation and treatment.   Brendia Sacks, MD  [facility] Pager [phone] 01/20/2012, 10:54 AM    LOS: 3 days

## 2012-01-21 NOTE — Evaluation (Signed)
Physical Therapy Evaluation Patient Details Name: Maykayla Highley MRN: [id number] DOB: 03/26/1919 Today's Date: 01/21/2012  Problem List:  Patient Active Problem List  Diagnoses  . Shoulder pain  . Dyspnea  . Hypothyroidism  . Dystrophic nail  . Cerumen impaction  . Depression with anxiety  . Anemia  . Rhinitis  . Neck pain  . Altered mental status  . Hyponatremia  . Dehydration  . RLS (restless legs syndrome)  . HTN (hypertension)  . Hypokalemia  . Hip pain, right    Past Medical History:  Past Medical History  Diagnosis Date  . Arthritis   . History of chicken pox   . Depression     husband died 10/07/2011  . Glaucoma   . Thyroid disease   . Hypertension    Past Surgical History:  Past Surgical History  Procedure Date  . Revision total knee arthroplasty     right knee  . Back surgery   . Cataract extraction, bilateral   . Thyroidectomy   . Abdominal hysterectomy     PT Assessment/Plan/Recommendation PT Assessment Clinical Impression Statement: Patient admitted with AMS after fall in ALF.  She presents with decreased mobility, decreased balance, decreased right LE AROM and strength and will benefit from skilled PT in the acute setting to maximize independence for decreased burden of care at next venue.  Will need SNF level rehab at D/C. PT Recommendation/Assessment: Patient will need skilled PT in the acute care venue PT Problem List: Decreased range of motion;Decreased strength;Decreased activity tolerance;Decreased safety awareness;Decreased balance;Decreased mobility PT Therapy Diagnosis : Difficulty walking;Generalized weakness PT Plan PT Frequency: Min 3X/week PT Treatment/Interventions: DME instruction;Gait training;Therapeutic activities;Therapeutic exercise;Balance training;Patient/family education;Functional mobility training PT Recommendation Follow Up Recommendations: Skilled nursing facility Equipment Recommended: Defer to next venue PT Goals    Acute Rehab PT Goals PT Goal Formulation: Patient unable to participate in goal setting Time For Goal Achievement: 2 weeks Pt will go Supine/Side to Sit: with min assist PT Goal: Supine/Side to Sit - Progress: Goal set today Pt will go Sit to Stand: with mod assist PT Goal: Sit to Stand - Progress: Goal set today Pt will go Stand to Sit: with mod assist PT Goal: Stand to Sit - Progress: Goal set today Pt will Transfer Bed to Chair/Chair to Bed: with mod assist PT Transfer Goal: Bed to Chair/Chair to Bed - Progress: Goal set today Pt will Stand: with min assist;with bilateral upper extremity support;3 - 5 min (for functional activity) PT Goal: Stand - Progress: Goal set today Pt will Ambulate: 1 - 15 feet;with +1 total assist;with rolling walker (pt=50%) PT Goal: Ambulate - Progress: Goal set today  PT Evaluation Precautions/Restrictions  Precautions Precautions: Fall Restrictions Weight Bearing Restrictions: No Prior Functioning  Home Living Type of Home: Assisted living Home Adaptive Equipment: Walker - rolling Additional Comments: further equipment unknown due to patient confused Prior Function Level of Independence: Requires assistive device for independence Comments: further assistance levels unknown due to patient confused Cognition Cognition Arousal/Alertness: Awake/alert Overall Cognitive Status: Impaired Orientation Level: Oriented to person;Disoriented to place;Disoriented to time;Disoriented to situation Safety/Judgement: Decreased safety judgement for tasks assessed Decreased Safety/Judgement: Decreased awareness of need for assistance Safety/Judgement - Other Comments: attempting to get OOB unassisted Awareness of Deficits: Decreased awareness of deficits Awareness of Deficits - Other Comments: attempting to stand unaided, then asked for assistance when unsuccessful.  Also wanted to walk to the bathroom and realized she couldn't only when she  tried. Sensation/Coordination   Extremity Assessment RLE  Assessment RLE Assessment: Exceptions to [facility] RLE AROM (degrees) RLE Overall AROM Comments: Not formally tested due to confusion, but functionally unable to flex knee past 40* sitting and hip also unable to flex more than approx 60*, but without pain complaints with questioning (stated it felt better.) RLE Strength RLE Overall Strength Comments: not formally tested, but noted no active ankle dorsiflexion with steps to chair LLE Assessment LLE Assessment:  (not formally tested due to confusion, but [facility] for transfer) Mobility (including Balance) Bed Mobility Bed Mobility: Yes Supine to Sit: 3: Mod assist;HOB elevated (Comment degrees) Supine to Sit Details (indicate cue type and reason): HOB 30*, reaching for and asking for assist to sit up, able to get her legs over independently Sitting - Scoot to Edge of Bed: 3: Mod assist Sitting - Scoot to Edge of Bed Details (indicate cue type and reason): using pad under patient  Transfers Transfers: Yes Sit to Stand: 1: +2 Total assist;From bed Sit to Stand Details (indicate cue type and reason): pt=25%, difficulty with forward weight shift and with getting right foot under her  Stand to Sit: 1: +2 Total assist;To chair/3-in-1;With armrests Stand to Sit Details: cues  and increased time to sit on 3:1 due to she felt she might sit on her gown even after repetitive cues we had moved the gown. Stand Pivot Transfers: 1: +2 Total assist Stand Pivot Transfer Details (indicate cue type and reason): with rolling walker and pt=40% difficulty with right weight shift, cues for stand step to turn with 3:1 and recliner placed under patient  Posture/Postural Control Posture/Postural Control: Postural limitations Postural Limitations: kyphoscoliosis with forward head and decreased right wight shift sitting and standing Balance Balance Assessed: Yes Static Standing Balance Static Standing - Balance  Support: Bilateral upper extremity supported Static Standing - Level of Assistance: 3: Mod assist Static Standing - Comment/# of Minutes: patient with posterior bias, stood approx 1-2 mintues Exercise    End of Session PT - End of Session Equipment Utilized During Treatment: Gait belt Activity Tolerance: Patient limited by fatigue Patient left: in chair;with call bell in reach;with bed alarm set Nurse Communication: Mobility status for transfers (norse tech in the room to observe and help with set up) General Behavior During Session: [facility] for tasks performed Cognition: Impaired  WYNN,CYNDI 01/21/2012, 11:00 AM

## 2012-01-21 NOTE — Progress Notes (Signed)
Physical therapy is recommending SNF. CSW called patients son Franky Macho, CSW emailed him list of facilities that have made bed offers, per his request. CSW to follow for choice. Franky Macho was informed patient likely to be discharged tomorrow.  Zackarie Chason C. Aidynn Krenn MSW, LCSW [phone]

## 2012-01-21 NOTE — Evaluation (Signed)
Occupational Therapy Evaluation Patient Details Name: [name] [name] MRN: [id number] DOB: 10/09/1919 Today's Date: 01/21/2012  Problem List:  Patient Active Problem List  Diagnoses  . Shoulder pain  . Dyspnea  . Hypothyroidism  . Dystrophic nail  . Cerumen impaction  . Depression with anxiety  . Anemia  . Rhinitis  . Neck pain  . Altered mental status  . Hyponatremia  . Dehydration  . RLS (restless legs syndrome)  . HTN (hypertension)  . Hypokalemia  . Hip pain, right    Past Medical History:  Past Medical History  Diagnosis Date  . Arthritis   . History of chicken pox   . Depression     husband died September 18, 2011  . Glaucoma   . Thyroid disease   . Hypertension    Past Surgical History:  Past Surgical History  Procedure Date  . Revision total knee arthroplasty     right knee  . Back surgery   . Cataract extraction, bilateral   . Thyroidectomy   . Abdominal hysterectomy     OT Assessment/Plan/Recommendation OT Assessment Clinical Impression Statement: This 78 year old female was admitted from ALF secondary to fall.  She is appropriate for skilled OT in the acute setting to increase independence and decrease burden of care at the venue with mod A goals in acute OT Recommendation/Assessment: Patient will need skilled OT in the acute care venue OT Problem List: Decreased strength;Decreased range of motion;Decreased activity tolerance;Impaired balance (sitting and/or standing);Impaired vision/perception;Decreased cognition;Decreased safety awareness;Decreased knowledge of use of DME or AE;Pain OT Therapy Diagnosis : Generalized weakness OT Plan OT Frequency: Min 1X/week OT Treatment/Interventions: Self-care/ADL training;Balance training;Patient/family education;Cognitive remediation/compensation;Therapeutic activities OT Recommendation Follow Up Recommendations: Skilled nursing facility Equipment Recommended: Defer to next venue Individuals Consulted Consulted and  Agree with Results and Recommendations: Patient OT Goals Acute Rehab OT Goals OT Goal Formulation: With patient Time For Goal Achievement: 2 weeks ADL Goals Pt Will Perform Lower Body Bathing: with mod assist;Sit to stand from chair;with cueing (comment type and amount) (min vcs) ADL Goal: Lower Body Bathing - Progress: Goal set today Pt Will Transfer to Toilet: with mod assist;Stand pivot transfer;3-in-1 ADL Goal: Toilet Transfer - Progress: Goal set today Pt Will Perform Toileting - Clothing Manipulation: with mod assist;Standing ADL Goal: Toileting - Clothing Manipulation - Progress: Goal set today Pt Will Perform Toileting - Hygiene: with min assist;Sit to stand from 3-in-1/toilet ADL Goal: Toileting - Hygiene - Progress: Goal set today  OT Evaluation Precautions/Restrictions  Precautions Precautions: Fall Restrictions Weight Bearing Restrictions: No Prior Functioning Home Living Lives With: Other (Comment) (alf)   ADL ADL Eating/Feeding: Simulated;Set up Where Assessed - Eating/Feeding: Chair Grooming: Performed;Brushing hair;Set up Where Assessed - Grooming: Supine, head of bed up Upper Body Bathing: Simulated;Minimal assistance Where Assessed - Upper Body Bathing: Sitting, chair;Supported Lower Body Bathing: Simulated;+2 Total assistance;Comment for patient % (25) Where Assessed - Lower Body Bathing: Sit to stand from chair Upper Body Dressing: Simulated;Minimal assistance Where Assessed - Upper Body Dressing: Sitting, chair;Supported Lower Body Dressing: Simulated;+2 Total assistance;Comment for patient % (10) Where Assessed - Lower Body Dressing: Sit to stand from chair Toilet Transfer: Simulated;+2 Total assistance;Comment for patient % (40) Toilet Transfer Method: Stand pivot Acupuncturist: Other (comment) (recliner to bed) Toileting - Clothing Manipulation: +2 Total assistance;Comment for patient % (0) Where Assessed - Toileting Clothing  Manipulation: Sit to stand from 3-in-1 or toilet Toileting - Hygiene: Simulated;+2 Total assistance;Comment for patient % (25) Where Assessed - Toileting Hygiene: Sit  to stand from 3-in-1 or toilet ADL Comments: spt to bed:  pt restless wanted to make up bed.  spt only to left side Vision/Perception  Vision - History Visual History: Macular degeneration Cognition Cognition Arousal/Alertness: Awake/alert Orientation Level: Oriented to person;Oriented to time;Oriented to situation (oriented to mo & yr) Safety/Judgement: Decreased safety judgement for tasks assessed; pt was attempting to get up from chair when I arrived.   Awareness of Deficits: Other (comment) (aware that right hurts but no fx) Cognition - Other Comments: time orientation varied between now and in the past Sensation/Coordination   Extremity Assessment RUE Assessment RUE Assessment: Exceptions to [facility] (old injury; forward flexion limited to 30 degrees and painfu) LUE Assessment LUE Assessment: Within Functional Limits Mobility  Bed Mobility Sit to Supine: 3: Mod assist Transfers Sit to Stand: Patient percentage (comment);1: +2 Total assist;Other (comment) (pt40%) Sit to Stand Details (indicate cue type and reason): min cues doe safety Exercises   End of Session OT - End of Session Activity Tolerance: Patient limited by fatigue Patient left: in bed;with call bell in reach;Other (comment) (called to make sure bed alarm was reset) General Behavior During Session: [facility] for tasks performed Cognition: Impaired Marica Otter, OTR/L [phone] 01/21/2012  Kalla Watson 01/21/2012, 2:58 PM

## 2012-01-21 NOTE — Progress Notes (Signed)
Dr Irene Limbo called for update on pt.  Informed him that we have been unable to obtain ua. States to keep trying no need for i/o cath at this time.

## 2012-01-21 NOTE — Progress Notes (Signed)
PROGRESS NOTE  [name] [name] ZOX:[id number] DOB: Aug 26, 1919 DOA: 01/17/2012 PCP: Letitia Libra, Ala Dach, MD, MD  Brief narrative: 78 year old woman with mechanical fall secondary to right hip pain. Also struck right shoulder back of head. Was noted to have a sodium of 119 and referred for admission. For the past 2 weeks she has had severe right hip pain. She is scheduled to have MRI of her lumbar spine February 8.  Seen January 28 for knee pain. X-ray demonstrated effusion. Referred for orthopedic evaluation as an outpatient. Right lower extremity vascular study was negative for DVT. She followed up with her orthopedic surgeon Dr. Madelon Lips. Plan was to obtain MRI of low back because of suspected sciatica.  Past medical history: Right rotator cuff dysfunction, depression, glaucoma, hypertension, hypothyroidism  Consultants:  Physical therapy:  Procedures:  None  Antibiotics:  None  Interim History: Interval documentation reviewed.   Subjective: Pain right hip persists.  Objective: Filed Vitals:   01/20/12 1519 01/20/12 1600 01/20/12 2133 01/21/12 0630  BP: 122/35 124/68 126/47 146/53  Pulse: 65  63 70  Temp: 97.9 F (36.6 C)  98.4 F (36.9 C) 98.1 F (36.7 C)  TempSrc: Oral  Oral Oral  Resp: 20  18 20   Height:      Weight:      SpO2: 97%  98% 99%    Intake/Output Summary (Last 24 hours) at 01/21/12 0921 Last data filed at 01/20/12 2259  Gross per 24 hour  Intake    243 ml  Output    300 ml  Net    -57 ml    Exam:   General:  Calm and comfortable. Speech fluent and clear.   Cardiovascular: Regular rate and rhythm. No murmur, rub, gallop. No lower extremity edema.  Respiratory: Clear to auscultation bilaterally. No wheezes, rales, rhonchi. Normal respiratory effort.  Psychiatric: Grossly normal mood and affect.  Data Reviewed: Basic Metabolic Panel:  Lab 01/20/12 8119 01/19/12 0530 01/18/12 1804 01/18/12 0826 01/17/12 2226  NA 131* 130* 128* 123*  120*  K 3.2* 3.6 -- -- --  CL 98 98 95* 92* 90*  CO2 24 23 22 23 22   GLUCOSE 86 82 116* 93 137*  BUN 8 15 16 9 10   CREATININE 0.55 0.64 0.80 0.54 0.50  CALCIUM 8.3* 8.6 8.1* 8.7 8.5  MG -- -- -- -- --  PHOS -- -- -- -- --   Liver Function Tests:  Lab 01/17/12 1435  AST 28  ALT 23  ALKPHOS 104  BILITOT 0.6  PROT 7.8  ALBUMIN 3.8   CBC:  Lab 01/18/12 0826 01/17/12 1754 01/17/12 1435  WBC 6.4 9.89.4 10.4  NEUTROABS -- -- 8.7*  HGB 10.8* 11.7*11.5* 12.2  HCT 30.7* 33.2*32.5* 34.4*  MCV 87.2 87.487.4 87.1  PLT 277 [id number] 333   Cardiac Enzymes:  Lab 01/18/12 0827 01/18/12 0005 01/17/12 1754 01/17/12 1435  CKTOTAL 266* 190* 195* --  CKMB 7.7* 7.3* 7.8* --  CKMBINDEX -- -- -- --  TROPONINI <0.30 <0.30 <0.30 <0.30   CBG:  Lab 01/18/12 0538  GLUCAP 103*     Studies: Dg Hip Complete Right  Ct Head Wo Contrast  01/17/2012  *RADIOLOGY REPORT*  Clinical Data: History of fall complaining of headaches and neck pain.  CT HEAD WITHOUT CONTRAST  Technique:  Contiguous axial images were obtained from the base of the skull through the vertex without contrast.  Comparison: No priors.  Findings: No acute intracranial abnormalities.  Specifically, no evidence of acute intracranial hemorrhage,  mass, mass effect, hydrocephalus or abnormal intra- or extra-axial fluid collections. There is moderate cerebral atrophy (age appropriate) with associated ex vacuo dilatation of the ventricular system.  Mild decreased attenuation throughout the deep and periventricular white matter of the cerebral hemispheres bilaterally is noted, likely indicative of chronic microvascular ischemic changes.  No displaced skull fractures.  Visualized paranasal sinuses and mastoids are well pneumatized.  IMPRESSION:  1.  No acute intracranial abnormalities. 2.  Moderate (age appropriate) cerebral atrophy. 3.  Probable mild chronic microvascular ischemic changes in the deep and periventricular white matter of the  cerebral hemispheres bilaterally.  Original Report Authenticated By: Florencia Reasons, M.D.   Ct Cervical Spine Wo Contrast  01/17/2012  *RADIOLOGY REPORT*  Clinical Data: History of fall complaining of neck pain.  CT CERVICAL SPINE WITHOUT CONTRAST  Technique:  Multidetector CT imaging of the cervical spine was performed. Multiplanar CT image reconstructions were also generated.  Comparison: No priors.  Findings:  No acute displaced fracture or gross malalignment of the cervical spine is noted.  There are multilevel changes of degenerative disc disease, most pronounced at C4-C5 where there is some calcification and small locules of gas within the intervertebral disc, and minimal (2 mm) anterolisthesis of C4 upon C5.  Multilevel facet arthropathy is also noted.  Prevertebral soft tissues are normal.  Visualized lung apices are remarkable for septal thickening bilaterally, which could suggest interstitial pulmonary edema.  In addition, multiple small pulmonary nodules are noted in the lungs bilaterally, predominately in the right apex where the largest nodule measures up to 5 mm in diameter (image 79 of series 7). Extensive atherosclerosis in the aortic arch and great vessels.  IMPRESSION: 1.  No acute abnormality of the cervical spine. 2.  Multilevel degenerative disc disease and cervical spondylosis, as above. 3.  Probable interstitial pulmonary edema noted in the lung apices bilaterally. 4.  Multiple pulmonary nodules visualized in the lung apices, the largest measuring 5 mm in the right apex.  Appearance is nonspecific, but is favored to be of infectious etiology (findings are asymmetric, greater on the right than on the left), possibly chronic.  Correlation with chest radiograph or chest CT is recommended at this time.  Original Report Authenticated By: Florencia Reasons, M.D.   Mr Brain Wo Contrast  01/17/2012  *RADIOLOGY REPORT*  Clinical Data: Altered mental status.  Hip pain.  MRI HEAD WITHOUT  CONTRAST  Technique:  Multiplanar, multiecho pulse sequences of the brain and surrounding structures were obtained according to standard protocol without intravenous contrast.  Comparison: CT head without contrast 01/16/2011.  Findings: The diffusion weighted images demonstrate no evidence for acute or subacute infarction.  Mild generalized atrophy is present. Mild periventricular subcortical white matter changes are evident bilaterally.  Flow is present in the major intracranial arteries. The patient is status post bilateral lens extractions.  The paranasal sinuses are clear.  A right mastoid effusion is present. No obstructing nasopharyngeal lesion is evident.  IMPRESSION:  1.  No acute intracranial abnormality. 2.  A mild generalized atrophy and diffuse white matter disease. This likely reflects the sequelae of chronic microvascular ischemia. 3.  Right mastoid effusion.  No obstructing nasopharyngeal lesion is evident.  Original Report Authenticated By: Jamesetta Orleans. MATTERN, M.D.   Mr Lumbar Spine Wo Contrast  01/17/2012  *RADIOLOGY REPORT*  Clinical Data: Fall.  Low back and hip pain.  MRI LUMBAR SPINE WITHOUT CONTRAST  Technique:  Multiplanar and multiecho pulse sequences of the lumbar spine were obtained without  intravenous contrast.  Comparison: None.  Findings: Normal signal is present in the conus medullaris which terminates at L1-2, within normal limits.  At least two cystic lesions are present within the right lobe of the liver.  These are incompletely characterized.  Chronic end plate marrow changes are present at L1-2.  Degenerative retrolisthesis at L1-2 measures 4 mm.  The disc levels at T12-L1 and above are normal.  L1-2:  Retrolisthesis results in uncovering of the disc.  Endplate osteophyte formation is present.  Mild central canal narrowing is evident.  Moderate right and mild left foraminal stenosis is present.  L2-3:  Small lateral disc protrusions are present.  Relatively short pedicles and  facet hypertrophy are evident.  This results in mild foraminal stenosis bilaterally.  L3-4:  A shallow lateral disc protrusions in combination of short pedicles and facet hypertrophy results in mild foraminal narrowing bilaterally.  L4-5:  A broad-based disc herniation is present.  The disc herniates into the neural foramina bilaterally.  Mild facet hypertrophy is present.  Mild left lateral recess and foraminal narrowing is present.  L5-S1:  A broad-based disc herniation is present.  Moderate facet hypertrophy is evident.  The mild left foraminal stenosis is present.  There is mild left lateral recess narrowing as well.  IMPRESSION:  1.  Mild left lateral recess and foraminal stenosis at L4-5 and L5- S1 secondary to broad-based disc herniations and facet hypertrophy. 2.  Mild foraminal narrowing at to L2-3 and L3-4 secondary to lateral disc protrusions and short pedicles. 3.  Degenerative retrolisthesis at L1-2 with uncovering of the disc and osteophyte formation.  Mild central and bilateral foraminal stenosis is worse on the right.  Original Report Authenticated By: Jamesetta Orleans. MATTERN, M.D.   Ct Hip Right Wo Contrast  01/17/2012  *RADIOLOGY REPORT*  Clinical Data: Fall.  Right hip pain.  Hysterectomy.  CT OF THE RIGHT HIP WITHOUT CONTRAST  Technique:  Multidetector CT imaging was performed according to the standard protocol. Multiplanar CT image reconstructions were also generated.  Comparison: 01/07/2012 radiographs  Findings: The spurring of the trochanters, spurring of the trochanters and acetabulum noted.  There is atrophy of the gluteus minimus and medius, potentially with low-level edema tracking along this region of fatty atrophy.  Edema tracks adjacent to the greater trochanter.  A definite fracture is not well observed.  IMPRESSION:  1.  Although there is some edema in the soft tissues adjacent to the hip, including along the trochanteric bursa, I do not observe a fracture.  There is some spurring  of the acetabulum and proximal humerus.  If there is high clinical index of suspicion of occult fracture and if no contraindication such as pacemaker, MRI could provide greater sensitivity and negative predictive value for hip fracture.  Original Report Authenticated By: Dellia Cloud, M.D.   Dg Knee Complete 4 Views Right  01/07/2012  *RADIOLOGY REPORT*  Clinical Data: 4-day history of right knee pain.  No recent injuries.  History of arthroplasty in April, 2012.  RIGHT KNEE - COMPLETE 4+ VIEW 01/07/2012:  Comparison: None.  Findings: Right total knee arthroplasty with anatomic alignment. No periprosthetic lucency to suggest loosening or granulomatosis. Severe osteopenia.  No evidence of acute fracture.  Moderate sized joint effusion.  IMPRESSION: No acute osseous abnormality.  Right total knee arthroplasty with anatomic alignment and no complicating features.  Moderate sized joint effusion.  Severe osteopenia.  Original Report Authenticated By: Arnell Sieving, M.D.    Scheduled Meds:    .  amLODipine  5 mg Oral Daily  . atenolol  50 mg Oral Daily  . docusate sodium  100 mg Oral BID  . enoxaparin  40 mg Subcutaneous Q24H  . fluticasone  2 spray Each Nare QHS  . levothyroxine  88 mcg Oral QAC breakfast  . LORazepam  0.5 mg Oral BID  . meloxicam  7.5 mg Oral Daily  . morphine  15 mg Oral Q12H  . potassium chloride  40 mEq Oral BID  . rOPINIRole  1 mg Oral BID  . rOPINIRole  2 mg Oral BID  . sodium chloride  3 mL Intravenous Q12H  . vitamin B-12  1,000 mcg Oral Daily  . DISCONTD: potassium chloride  40 mEq Oral Once   Continuous Infusions:    . DISCONTD: sodium chloride 100 mL/hr at 01/20/12 0203     Assessment/Plan: 1. Acute metabolic encephalopathy: Resolved. Likely secondary to hyponatremia. MRI of brain negative. 2. Hyponatremia: Resolving. Secondary to volume depletion; doubt contributed to by Celexa.  TSH within normal limits. 3. Uncontrolled hypertension: Now stable.  Previously labile secondary to pain. Continue atenolol, Norvasc. 4. Hypokalemia: Repleted. 5. Dehydration: Resolved. 6. Mechanical fall: Secondary to hip pain. Lumbar spine films noted. Degenerative disease. Physical therapy and pain control. May need skilled nursing facility prior to discharge back to assisted living facility. 7. Right hip pain: Hip CT negative. Followup with orthopedic surgeon as an outpatient. Start long-acting narcotics. Physical therapy. 8. Degenerative lumbar back disease: Follow up as an outpatient. Pain control and physical therapy. 9. Knee swelling/leg pain: Stable  Appears stable for discharge today pending physical therapy evaluation.  Code Status: DO NOT RESUSCITATE Family Communication: Son Franky Macho at [phone] Disposition Plan: Return to assisted living versus skilled nursing facility.   Brendia Sacks, MD  [facility] Pager [phone] 01/21/2012, 9:21 AM    LOS: 4 days

## 2012-01-22 LAB — URINALYSIS, ROUTINE W REFLEX MICROSCOPIC
Glucose, UA: NEGATIVE mg/dL
Protein, ur: NEGATIVE mg/dL
Specific Gravity, Urine: 1.018 (ref 1.005–1.030)
Urobilinogen, UA: 1 mg/dL (ref 0.0–1.0)

## 2012-01-22 LAB — URINE MICROSCOPIC-ADD ON

## 2012-01-22 MED ORDER — DEXTROSE 5 % IV SOLN
1.0000 g | INTRAVENOUS | Status: DC
  Administered 2012-01-22 – 2012-01-25 (×4): 1 g via INTRAVENOUS
  Filled 2012-01-22 (×5): qty 10

## 2012-01-22 NOTE — Progress Notes (Signed)
[name] NP made aware that pts UA came back positive for a UTI.  Urine Culture still pending.  [name] requested that nursing staff encourage po intake and offer pt cranberry juice. [name] [name]

## 2012-01-22 NOTE — Progress Notes (Signed)
PROGRESS NOTE  [name] [name] ZOX:[id number] DOB: 05-Jul-1919 DOA: 01/17/2012 PCP: Letitia Libra, Ala Dach, MD, MD Orthopedic surgeon: Frederico Hamman, M.D.  Brief narrative: 78 year old woman presented to the emergency department from assisted living with mechanical fall secondary to right hip pain. She was noted to have a sodium of 119, confusion and referred for admission. For the past 2 weeks she has had severe right hip pain. She was scheduled to have MRI of her lumbar spine February 8.  Seen January 28 for knee pain. X-ray demonstrated effusion. Referred for orthopedic evaluation as an outpatient. Right lower extremity vascular study was negative for DVT. She followed up with her orthopedic surgeon Dr. Madelon Lips. Plan was to obtain MRI of low back because of suspected sciatica.  Patient was admitted and hyponatremia resolved on a course of several days with IV fluids. Acute encephalopathy also resolved. Multiple imaging studies were performed which showed severe degenerative disease in her low back. Physical and occupational therapy recommended skilled nursing facility prior to return to assisted living. Although Dr. Madelon Lips was out of town I did notify his physician assistant who will discuss the case with him and followup in the outpatient setting.   Prior to discharge the patient became suddenly confused and again developed encephalopathy. Differential initially included in her chart reaction to newly started long-acting narcotic versus possible urinary tract infection. Long-acting narcotic was discontinued and urinalysis did reveal urinary tract infection. Starting antibiotics the patient is much improved. At this point we will continue with short acting narcotics as needed. Consideration could begin in to long-acting narcotics in the outpatient setting. As the patient's encephalopathy is improving discharge to skilled nursing facility #13 is anticipated.  Past medical history: Right rotator  cuff dysfunction, depression, glaucoma, hypertension, hypothyroidism  Consultants:  Physical therapy: Skilled rehabilitation.  Occupational therapy: Skilled rehabilitation.  Procedures:  None  Antibiotics:  February 12: Rocephin  Interim History: Interval documentation reviewed. Urinalysis was grossly positive. Rocephin has been started. Physical and occupational therapy have recommended skilled nursing facility rehabilitation. Per nurse the patient's confusion has decreased.  Subjective: Complains of some right low back/hip pain.  Objective: Filed Vitals:   01/21/12 0630 01/21/12 1636 01/21/12 2215 01/22/12 0650  BP: 146/53 111/49 160/56 164/65  Pulse: 70 64 57 62  Temp: 98.1 F (36.7 C) 98 F (36.7 C) 98.2 F (36.8 C) 97.3 F (36.3 C)  TempSrc: Oral Oral Oral Oral  Resp: 20 18 18 18   Height:      Weight:      SpO2: 99% 97% 97% 97%    Intake/Output Summary (Last 24 hours) at 01/22/12 1324 Last data filed at 01/22/12 0900  Gross per 24 hour  Intake    123 ml  Output      0 ml  Net    123 ml    Exam:   General:  Calm and comfortable. Speech fluent and clear.   Cardiovascular: Regular rate and rhythm. No murmur, rub, gallop. No lower extremity edema.  Respiratory: Clear to auscultation bilaterally. No wheezes, rales, rhonchi. Normal respiratory effort.  Psychiatric: Grossly normal mood and affect. Confusion appears resolved. Alert and oriented to herself, hospital, month, year.  Data Reviewed: Basic Metabolic Panel:  Lab 01/20/12 8119 01/19/12 0530 01/18/12 1804 01/18/12 0826 01/17/12 2226  NA 131* 130* 128* 123* 120*  K 3.2* 3.6 -- -- --  CL 98 98 95* 92* 90*  CO2 24 23 22 23 22   GLUCOSE 86 82 116* 93 137*  BUN 8 15  16 9 10   CREATININE 0.55 0.64 0.80 0.54 0.50  CALCIUM 8.3* 8.6 8.1* 8.7 8.5  MG -- -- -- -- --  PHOS -- -- -- -- --   Liver Function Tests:  Lab 01/17/12 1435  AST 28  ALT 23  ALKPHOS 104  BILITOT 0.6  PROT 7.8  ALBUMIN 3.8     CBC:  Lab 01/18/12 0826 01/17/12 1754 01/17/12 1435  WBC 6.4 9.89.4 10.4  NEUTROABS -- -- 8.7*  HGB 10.8* 11.7*11.5* 12.2  HCT 30.7* 33.2*32.5* 34.4*  MCV 87.2 87.487.4 87.1  PLT 277 [id number] 333   Cardiac Enzymes:  Lab 01/18/12 0827 01/18/12 0005 01/17/12 1754 01/17/12 1435  CKTOTAL 266* 190* 195* --  CKMB 7.7* 7.3* 7.8* --  CKMBINDEX -- -- -- --  TROPONINI <0.30 <0.30 <0.30 <0.30   CBG:  Lab 01/18/12 0538  GLUCAP 103*     Studies: Dg Hip Complete Right  Ct Head Wo Contrast  01/17/2012  *RADIOLOGY REPORT*  Clinical Data: History of fall complaining of headaches and neck pain.  CT HEAD WITHOUT CONTRAST  Technique:  Contiguous axial images were obtained from the base of the skull through the vertex without contrast.  Comparison: No priors.  Findings: No acute intracranial abnormalities.  Specifically, no evidence of acute intracranial hemorrhage, mass, mass effect, hydrocephalus or abnormal intra- or extra-axial fluid collections. There is moderate cerebral atrophy (age appropriate) with associated ex vacuo dilatation of the ventricular system.  Mild decreased attenuation throughout the deep and periventricular white matter of the cerebral hemispheres bilaterally is noted, likely indicative of chronic microvascular ischemic changes.  No displaced skull fractures.  Visualized paranasal sinuses and mastoids are well pneumatized.  IMPRESSION:  1.  No acute intracranial abnormalities. 2.  Moderate (age appropriate) cerebral atrophy. 3.  Probable mild chronic microvascular ischemic changes in the deep and periventricular white matter of the cerebral hemispheres bilaterally.  Original Report Authenticated By: Florencia Reasons, M.D.   Ct Cervical Spine Wo Contrast  01/17/2012  *RADIOLOGY REPORT*  Clinical Data: History of fall complaining of neck pain.  CT CERVICAL SPINE WITHOUT CONTRAST  Technique:  Multidetector CT imaging of the cervical spine was performed. Multiplanar CT image  reconstructions were also generated.  Comparison: No priors.  Findings:  No acute displaced fracture or gross malalignment of the cervical spine is noted.  There are multilevel changes of degenerative disc disease, most pronounced at C4-C5 where there is some calcification and small locules of gas within the intervertebral disc, and minimal (2 mm) anterolisthesis of C4 upon C5.  Multilevel facet arthropathy is also noted.  Prevertebral soft tissues are normal.  Visualized lung apices are remarkable for septal thickening bilaterally, which could suggest interstitial pulmonary edema.  In addition, multiple small pulmonary nodules are noted in the lungs bilaterally, predominately in the right apex where the largest nodule measures up to 5 mm in diameter (image 79 of series 7). Extensive atherosclerosis in the aortic arch and great vessels.  IMPRESSION: 1.  No acute abnormality of the cervical spine. 2.  Multilevel degenerative disc disease and cervical spondylosis, as above. 3.  Probable interstitial pulmonary edema noted in the lung apices bilaterally. 4.  Multiple pulmonary nodules visualized in the lung apices, the largest measuring 5 mm in the right apex.  Appearance is nonspecific, but is favored to be of infectious etiology (findings are asymmetric, greater on the right than on the left), possibly chronic.  Correlation with chest radiograph or chest CT is recommended at this time.  Original Report Authenticated By: Florencia Reasons, M.D.   Mr Brain Wo Contrast  01/17/2012  *RADIOLOGY REPORT*  Clinical Data: Altered mental status.  Hip pain.  MRI HEAD WITHOUT CONTRAST  Technique:  Multiplanar, multiecho pulse sequences of the brain and surrounding structures were obtained according to standard protocol without intravenous contrast.  Comparison: CT head without contrast 01/16/2011.  Findings: The diffusion weighted images demonstrate no evidence for acute or subacute infarction.  Mild generalized atrophy is  present. Mild periventricular subcortical white matter changes are evident bilaterally.  Flow is present in the major intracranial arteries. The patient is status post bilateral lens extractions.  The paranasal sinuses are clear.  A right mastoid effusion is present. No obstructing nasopharyngeal lesion is evident.  IMPRESSION:  1.  No acute intracranial abnormality. 2.  A mild generalized atrophy and diffuse white matter disease. This likely reflects the sequelae of chronic microvascular ischemia. 3.  Right mastoid effusion.  No obstructing nasopharyngeal lesion is evident.  Original Report Authenticated By: Jamesetta Orleans. MATTERN, M.D.   Mr Lumbar Spine Wo Contrast  01/17/2012  *RADIOLOGY REPORT*  Clinical Data: Fall.  Low back and hip pain.  MRI LUMBAR SPINE WITHOUT CONTRAST  Technique:  Multiplanar and multiecho pulse sequences of the lumbar spine were obtained without intravenous contrast.  Comparison: None.  Findings: Normal signal is present in the conus medullaris which terminates at L1-2, within normal limits.  At least two cystic lesions are present within the right lobe of the liver.  These are incompletely characterized.  Chronic end plate marrow changes are present at L1-2.  Degenerative retrolisthesis at L1-2 measures 4 mm.  The disc levels at T12-L1 and above are normal.  L1-2:  Retrolisthesis results in uncovering of the disc.  Endplate osteophyte formation is present.  Mild central canal narrowing is evident.  Moderate right and mild left foraminal stenosis is present.  L2-3:  Small lateral disc protrusions are present.  Relatively short pedicles and facet hypertrophy are evident.  This results in mild foraminal stenosis bilaterally.  L3-4:  A shallow lateral disc protrusions in combination of short pedicles and facet hypertrophy results in mild foraminal narrowing bilaterally.  L4-5:  A broad-based disc herniation is present.  The disc herniates into the neural foramina bilaterally.  Mild facet  hypertrophy is present.  Mild left lateral recess and foraminal narrowing is present.  L5-S1:  A broad-based disc herniation is present.  Moderate facet hypertrophy is evident.  The mild left foraminal stenosis is present.  There is mild left lateral recess narrowing as well.  IMPRESSION:  1. Mild left lateral recess and foraminal stenosis at L4-5 and L5- S1 secondary to broad-based disc herniations and facet hypertrophy. 2.  Mild foraminal narrowing at to L2-3 and L3-4 secondary to lateral disc protrusions and short pedicles. 3.  Degenerative retrolisthesis at L1-2 with uncovering of the disc and osteophyte formation.  Mild central and bilateral foraminal stenosis is worse on the right.  Original Report Authenticated By: Jamesetta Orleans. MATTERN, M.D.   Ct Hip Right Wo Contrast  01/17/2012  *RADIOLOGY REPORT*  Clinical Data: Fall.  Right hip pain.  Hysterectomy.  CT OF THE RIGHT HIP WITHOUT CONTRAST  Technique:  Multidetector CT imaging was performed according to the standard protocol. Multiplanar CT image reconstructions were also generated.  Comparison: 01/07/2012 radiographs  Findings: The spurring of the trochanters, spurring of the trochanters and acetabulum noted.  There is atrophy of the gluteus minimus and medius, potentially with low-level edema tracking  along this region of fatty atrophy.  Edema tracks adjacent to the greater trochanter.  A definite fracture is not well observed.  IMPRESSION:  1.  Although there is some edema in the soft tissues adjacent to the hip, including along the trochanteric bursa, I do not observe a fracture.  There is some spurring of the acetabulum and proximal humerus.  If there is high clinical index of suspicion of occult fracture and if no contraindication such as pacemaker, MRI could provide greater sensitivity and negative predictive value for hip fracture.  Original Report Authenticated By: Dellia Cloud, M.D.   Dg Knee Complete 4 Views Right  01/07/2012   *RADIOLOGY REPORT*  Clinical Data: 4-day history of right knee pain.  No recent injuries.  History of arthroplasty in April, 2012.  RIGHT KNEE - COMPLETE 4+ VIEW 01/07/2012:  Comparison: None.  Findings: Right total knee arthroplasty with anatomic alignment. No periprosthetic lucency to suggest loosening or granulomatosis. Severe osteopenia.  No evidence of acute fracture.  Moderate sized joint effusion.  IMPRESSION: No acute osseous abnormality.  Right total knee arthroplasty with anatomic alignment and no complicating features.  Moderate sized joint effusion.  Severe osteopenia.  Original Report Authenticated By: Arnell Sieving, M.D.    Scheduled Meds:    . amLODipine  5 mg Oral Daily  . atenolol  50 mg Oral Daily  . cefTRIAXone (ROCEPHIN)  IV  1 g Intravenous Q24H  . docusate sodium  100 mg Oral BID  . enoxaparin  40 mg Subcutaneous Q24H  . fluticasone  2 spray Each Nare QHS  . levothyroxine  88 mcg Oral QAC breakfast  . LORazepam  0.5 mg Oral BID  . meloxicam  7.5 mg Oral Daily  . potassium chloride  40 mEq Oral BID  . rOPINIRole  1 mg Oral BID  . rOPINIRole  2 mg Oral BID  . sodium chloride  3 mL Intravenous Q12H  . vitamin B-12  1,000 mcg Oral Daily   Continuous Infusions:     Assessment/Plan: 1. Acute metabolic encephalopathy: Recurrent. Initially secondary to hyponatremia. Recurred secondary to neck tract infection and is now again improving.  MRI of brain done on admission was negative. 2. UTI: Empiric Rocephin. Followup culture. 3. Hyponatremia: Resolving. Secondary to volume depletion; doubt contributed to by Celexa.  TSH within normal limits. 4. Uncontrolled hypertension: Now stable. Previously labile secondary to pain. Continue atenolol, Norvasc. 5. Hypokalemia: Repleted. 6. Dehydration: Resolved. 7. Mechanical fall: Secondary to hip pain. Lumbar spine imaging noted. Degenerative disease. Physical therapy and pain control. Skilled nursing facility for  rehabilitation. 8. Right hip pain: Hip CT negative. Followup with orthopedic surgeon as an outpatient. Degenerative lumbar back disease: Follow up as an outpatient. Pain control and physical therapy.  Discussed with son by telephone today. Discharge is anticipated February 13 and he is aware. All questions answered to his apparent satisfaction.   Code Status: DO NOT RESUSCITATE Family Communication: [name] [name] at [phone] home, [phone] cell Disposition Plan: Return to assisted living versus skilled nursing facility.   Brendia Sacks, MD  [facility] Pager [phone] 01/22/2012, 1:24 PM    LOS: 5 days

## 2012-01-22 NOTE — Progress Notes (Signed)
   CARE MANAGEMENT NOTE 01/22/2012  Patient:  [name],[name]   Account Number:  [id number]  Date Initiated:  01/22/2012  Documentation initiated by:  Lanier Clam  Subjective/Objective Assessment:   ADMITTED W/AMS,R HIP PAIN.     Action/Plan:   FROM ASST LIV.   Anticipated DC Date:  01/22/2012   Anticipated DC Plan:  SKILLED NURSING FACILITY  In-house referral  Clinical Social Worker         Choice offered to / List presented to:             Status of service:  Completed, signed off Medicare Important Message given?   (If response is "NO", the following Medicare IM given date fields will be blank) Date Medicare IM given:   Date Additional Medicare IM given:    Discharge Disposition:  SKILLED NURSING FACILITY  Per UR Regulation:  Reviewed for med. necessity/level of care/duration of stay  Comments:  01/22/12 [name] [name] MAHBIR RN,BSN NCM [phone] PT-SNF. CSW DILIGENTLY FOLLOWING.

## 2012-01-22 NOTE — Progress Notes (Signed)
[name] has chosen [facility], [facility] for [employer]. CSW will continue to follow and assist with transfer to SNF when medically stable.  Dellie Burns, MSW, LCSWA [phone] (cover)

## 2012-01-23 DIAGNOSIS — D649 Anemia, unspecified: Secondary | ICD-10-CM | POA: Diagnosis present

## 2012-01-23 DIAGNOSIS — R748 Abnormal levels of other serum enzymes: Secondary | ICD-10-CM | POA: Diagnosis present

## 2012-01-23 DIAGNOSIS — N189 Chronic kidney disease, unspecified: Secondary | ICD-10-CM | POA: Diagnosis present

## 2012-01-23 DIAGNOSIS — W19XXXA Unspecified fall, initial encounter: Secondary | ICD-10-CM | POA: Diagnosis present

## 2012-01-23 DIAGNOSIS — N390 Urinary tract infection, site not specified: Secondary | ICD-10-CM | POA: Diagnosis present

## 2012-01-23 DIAGNOSIS — G9341 Metabolic encephalopathy: Principal | ICD-10-CM | POA: Diagnosis present

## 2012-01-23 LAB — MAGNESIUM: Magnesium: 1.8 mg/dL (ref 1.5–2.5)

## 2012-01-23 MED ORDER — POTASSIUM CHLORIDE CRYS ER 20 MEQ PO TBCR
40.0000 meq | EXTENDED_RELEASE_TABLET | Freq: Three times a day (TID) | ORAL | Status: DC
  Administered 2012-01-23 – 2012-01-24 (×2): 40 meq via ORAL
  Filled 2012-01-23 (×5): qty 2

## 2012-01-23 MED ORDER — AMLODIPINE BESYLATE 10 MG PO TABS
10.0000 mg | ORAL_TABLET | Freq: Every day | ORAL | Status: DC
  Administered 2012-01-24 – 2012-01-25 (×2): 10 mg via ORAL
  Filled 2012-01-23 (×2): qty 1

## 2012-01-23 NOTE — Progress Notes (Addendum)
PATIENT DETAILS Name: [name] [name] Age: 78 y.o. Sex: female Date of Birth: January 25, 1919 Admit Date: 01/17/2012 [id number] Montez Hageman, Ala Dach, MD, MD Emergency contact: Rondell Reams at [phone] home, [phone] cell   CONSULTS: None  Interval History: [name] [name] is a 78 year old female who presented to the emergency department from assisted living with mechanical fall secondary to right hip pain. She was noted to have a sodium of 119, confusion and referred for admission. Over the course of her hospital stay, her sodium has improved and her mental status improved as well.  She had recurrent encephalopathy that was thought to have been triggered by a UTI, and is now on Rocephin with urine cultures pending.   Plan is to d/c her to SNF in next 24 hours.  ROS: [name] [name] feels unwell.  She has abdominal and flank pain, but no N/V.  She ate a small amount of breakfast this morning.  She has had acute on subacute severe right hip pain, but became confused with long acting narcotics.   Objective: Vital signs in last 24 hours: Temp:  [97.9 F (36.6 C)-98.8 F (37.1 C)] 98.4 F (36.9 C) (02/13 0704) Pulse Rate:  [58-64] 64  (02/13 0704) Resp:  [16-18] 18  (02/13 0704) BP: (119-168)/(50-80) 168/80 mmHg (02/13 0815) SpO2:  [96 %-98 %] 98 % (02/13 0704) Weight change:  Last BM Date: 01/20/12  Intake/Output from previous day:  Intake/Output Summary (Last 24 hours) at 01/23/12 1042 Last data filed at 01/23/12 0853  Gross per 24 hour  Intake    480 ml  Output    650 ml  Net   -170 ml     Physical Exam:  Gen:  NAD Cardiovascular:  RRR, III/VI SEM LUSB Respiratory: Lungs CTAB Gastrointestinal: Abdomen soft, NT/ND with normal active bowel sounds. Extremities: Trace edema     Lab Results: Basic Metabolic Panel:  Lab 01/20/12 9528 01/19/12 0530 01/18/12 1804 01/18/12 0826 01/17/12 2226  NA 131* 130* 128* 123* 120*  K 3.2* 3.6 -- -- --  CL 98 98 95* 92* 90*  CO2 24  23 22 23 22   GLUCOSE 86 82 116* 93 137*  BUN 8 15 16 9 10   CREATININE 0.55 0.64 0.80 0.54 0.50  CALCIUM 8.3* 8.6 8.1* 8.7 8.5  MG -- -- -- -- --  PHOS -- -- -- -- --   GFR Estimated Creatinine Clearance: 38.6 ml/min (by C-G formula based on Cr of 0.55). Liver Function Tests:  Lab 01/17/12 1435  AST 28  ALT 23  ALKPHOS 104  BILITOT 0.6  PROT 7.8  ALBUMIN 3.8    CBC:  Lab 01/18/12 0826 01/17/12 1754 01/17/12 1435  WBC 6.4 9.89.4 10.4  NEUTROABS -- -- 8.7*  HGB 10.8* 11.7*11.5* 12.2  HCT 30.7* 33.2*32.5* 34.4*  MCV 87.2 87.487.4 87.1  PLT 277 303298 333   Cardiac Enzymes:  Lab 01/18/12 0827 01/18/12 0005 01/17/12 1754 01/17/12 1435  CKTOTAL 266* 190* 195* --  CKMB 7.7* 7.3* 7.8* --  CKMBINDEX -- -- -- --  TROPONINI <0.30 <0.30 <0.30 <0.30   CBG:  Lab 01/18/12 0538  GLUCAP 103*    Ref. Range 01/17/2012 17:54  TSH Latest Range: 0.350-4.500 uIU/mL 3.134   Microbiology Recent Results (from the past 240 hour(s))  URINE CULTURE     Status: Normal (Preliminary result)   Collection Time   01/22/12  4:07 AM      Component Value Range Status Comment   Specimen Description URINE, RANDOM  Final    Special Requests NONE   Final    Culture  Setup Time [id number]   Final    Colony Count PENDING   Incomplete    Culture Culture reincubated for better growth   Final    Report Status PENDING   Incomplete     Recent Results (from the past 240 hour(s))  URINE CULTURE     Status: Normal (Preliminary result)   Collection Time   01/22/12  4:07 AM      Component Value Range Status Comment   Specimen Description URINE, RANDOM   Final    Special Requests NONE   Final    Culture  Setup Time [id number]   Final    Colony Count PENDING   Incomplete    Culture Culture reincubated for better growth   Final    Report Status PENDING   Incomplete     Studies/Results: No results found.  Medications: Scheduled Meds:    . amLODipine  5 mg Oral Daily  . atenolol  50 mg  Oral Daily  . cefTRIAXone (ROCEPHIN)  IV  1 g Intravenous Q24H  . docusate sodium  100 mg Oral BID  . enoxaparin  40 mg Subcutaneous Q24H  . fluticasone  2 spray Each Nare QHS  . levothyroxine  88 mcg Oral QAC breakfast  . LORazepam  0.5 mg Oral BID  . meloxicam  7.5 mg Oral Daily  . potassium chloride  40 mEq Oral BID  . rOPINIRole  1 mg Oral BID  . rOPINIRole  2 mg Oral BID  . sodium chloride  3 mL Intravenous Q12H  . vitamin B-12  1,000 mcg Oral Daily   Continuous Infusions:  PRN Meds:.acetaminophen, acetaminophen, albuterol, alum & mag hydroxide-simeth, bisacodyl, labetalol, ondansetron (ZOFRAN) IV, ondansetron, oxyCODONE Antibiotics: Anti-infectives     Start     Dose/Rate Route Frequency Ordered Stop   01/22/12 0800   cefTRIAXone (ROCEPHIN) 1 g in dextrose 5 % 50 mL IVPB        1 g 100 mL/hr over 30 Minutes Intravenous Every 24 hours 01/22/12 0736             Assessment/Plan:  Principal Problem:  *Altered mental status / Metabolic encephalopathy /  Hyponatremia The patient was found to be confused with a sodium on 119 on admission.  The hyponatremia resolved on a course of several days with IV fluids and with holding her Lasix and Celexa. Acute encephalopathy also resolved. She currently appears to be at baseline, with a mildly low sodium of 131. Active Problems:  Hypothyroidism The patient is appropriately replaced on her current dose of synthroid.  TSH was checked on 01/17/12 and found to be WNL.  Depression with anxiety Celexa has been placed on hold (may have been contributing to hyponatremia).  She does have a depressed affect, and may benefit from re-challenge with SSRI therapy, but will defer to PCP.  Dehydration The patient was thought to be dehydrated on presentation.  A review of her labs did not show any elevation of specific gravity but her BUN/creatinine ratio was slightly high.  With hydration, her BUN/creatine ratio has come down.  Her diuretic therapy was  also placed on hold.  RLS (restless legs syndrome) The patient has been maintained on her usual dose of Requip.  HTN (hypertension) SBP has been intermittantly high.  She is currently on Tenormin (home dose) with Norvasc added 01/19/12.  Will increase Norvasc to 10 mg.   Lasix remains on  hold.  Hypokalemia The patient is on 40 mEq of oral replacement therapy BID.  Change to TID.  Will check magnesium.  Hip pain, right s/p fall Seen January 28 for knee pain. X-ray demonstrated effusion. Referred for orthopedic evaluation as an outpatient. Right lower extremity vascular study was negative for DVT. She followed up with her orthopedic surgeon Dr. Madelon Lips. Plan was to obtain MRI of low back because of suspected sciatica. Multiple imaging studies were performed which showed severe degenerative disease in her low back. Physical and occupational therapy recommended skilled nursing facility prior to return to assisted living. Although Dr. Madelon Lips was out of town, Dr. Irene Limbo did notify his physician assistant who will discuss the case with him and followup in the outpatient setting.   Normocytic anemia Baseline hemoglobin is 11 mg/dL.  Current hemoglobin is 10.8 mg/dL.  No further work up necessary as inpatient.  Elevated CK / Elevated CK-MB level Done on admission, and may be related to rhabdomyolysis from fall.  Her troponins were negative.  Will recheck CK and CK-MB in the morning.  Stage 2 CKD Creatinine is stable.  Watch closely since she is on Mobic.  UTI The patient was placed on Rocephin (day #2).  Urine cultures are pending.  *I contacted the patient's son, [name] [name], by telephone and updated him regarding the patient's condition and plan of care.  I answered all of his questions.    LOS: 6 days   Hillery Aldo, MD Pager [phone]  01/23/2012, 10:42 AM

## 2012-01-23 NOTE — Progress Notes (Signed)
Awaiting auth from [employer]. Jolanda Mccann C. Loyd Marhefka MSW, LCSW [phone]

## 2012-01-23 NOTE — Progress Notes (Signed)
Physical Therapy Treatment Patient Details Name: [name] [name] MRN: [id number] DOB: 1919/05/15 Today's Date: 01/23/2012  PT Assessment/Plan  PT - Assessment/Plan Comments on Treatment Session: Patient much improved with mobility over last session.  Still needing +2 now for safety, and slow speed with ambulation and all functional movements increases fall risk.   PT Plan: Discharge plan remains appropriate PT Frequency: Min 3X/week Follow Up Recommendations: Skilled nursing facility Equipment Recommended: Defer to next venue PT Goals  Acute Rehab PT Goals Pt will go Sit to Stand: with mod assist PT Goal: Sit to Stand - Progress: Progressing toward goal Pt will go Stand to Sit: with mod assist PT Goal: Stand to Sit - Progress: Progressing toward goal Pt will Ambulate: with rolling walker;with mod assist;51 - 150 feet PT Goal: Ambulate - Progress: Updated due to goal met  PT Treatment Precautions/Restrictions  Precautions Precautions: Fall Restrictions Weight Bearing Restrictions: No Mobility (including Balance) Bed Mobility Bed Mobility: No (already up in chair) Transfers Transfers: Yes Sit to Stand: 1: +2 Total assist;From chair/3-in-1;With armrests;Patient percentage (comment) (50) Sit to Stand Details (indicate cue type and reason): cues for scoot to edge of chair and push from chair Stand to Sit: 1: +2 Total assist;To chair/3-in-1;Patient percentage (comment) (60) Stand to Sit Details: cues for positioning in middle of chair and to scoot back Ambulation/Gait Ambulation/Gait: Yes Ambulation/Gait Assistance: 1: +2 Total assist;Patient percentage (comment) (70) Ambulation/Gait Assistance Details (indicate cue type and reason): cues for weight shift, balance with turns and backing and for posture, c/o pain behind right knee (like stretching pain) Ambulation Distance (Feet): 50 Feet Assistive device: Rolling walker Gait Pattern: Decreased step length - left;Antalgic;Trunk  flexed  Posture/Postural Control Posture/Postural Control: Postural limitations Postural Limitations: extremely kyphotic and barely able to make eye contact in standing position due to cervical limitations as well Exercise  General Exercises - Lower Extremity Ankle Circles/Pumps: AROM;Both;10 reps;Seated Heel Slides: AAROM;Both;10 reps;Seated Hip ABduction/ADduction: AROM;Both;10 reps;Seated End of Session PT - End of Session Equipment Utilized During Treatment: Gait belt Activity Tolerance: Patient limited by pain Patient left: in chair;with call bell in reach;with family/visitor present General Behavior During Session: [facility] for tasks performed Cognition: [facility] for tasks performed Cognitive Impairment: Much improved from last session and able to converse, though still difficulty to get actual pain rating and patient did not remember she is scheduled to go to rehab.  Encouraged her she may eventually get back home after rehab stay.  Son present and also encouraging.  Froilan Mclean,CYNDI 01/23/2012, 2:03 PM

## 2012-01-24 LAB — BASIC METABOLIC PANEL
CO2: 23 mEq/L (ref 19–32)
Calcium: 9 mg/dL (ref 8.4–10.5)
Creatinine, Ser: 0.7 mg/dL (ref 0.50–1.10)
GFR calc Af Amer: 86 mL/min — ABNORMAL LOW (ref >90)
GFR calc non Af Amer: 74 mL/min — ABNORMAL LOW (ref >90)
Sodium: 133 mEq/L — ABNORMAL LOW (ref 135–145)

## 2012-01-24 LAB — CK TOTAL AND CKMB (NOT AT ARMC)
CK, MB: 2.8 ng/mL (ref 0.3–4.0)
Total CK: 44 U/L (ref 7–177)

## 2012-01-24 LAB — URINE CULTURE: Colony Count: >=100000

## 2012-01-24 NOTE — Progress Notes (Signed)
PATIENT DETAILS Name: [name] [name] Age: 78 y.o. Sex: female Date of Birth: 10/09/1919 Admit Date: 01/17/2012 VHQ:IONGEX Montez Hageman, [name] Dach, MD, MD Emergency contact: [name] [name] at [phone] home, [phone] cell   Medical Consults:  None  Other Consults:  Social Services: [employer] authorization confirmed to return to SNF at [facility].  Physical Therapy: Needs 2+ assist for safety; High fall risk  Occupational Therapy: SNF recommended.  Interval History: [name] [name] is a 78 year old female who presented to the emergency department from assisted living with mechanical fall secondary to right hip pain. She was noted to have a sodium of 119, confusion and referred for admission. Over the course of her hospital stay, her sodium has improved and her mental status improved as well.  She had recurrent encephalopathy that was thought to have been triggered by a UTI, and is now on Rocephin with urine cultures pending.   Plan is to d/c her to SNF when urine culture finalized.  ROS: [name] [name] seems depressed.   The nurses report that she is intermittently confused.  She still has right flank pain.  She feels weak.  Appetite remains poor.   Objective: Vital signs in last 24 hours: Temp:  [97.4 F (36.3 C)-98.1 F (36.7 C)] 98.1 F (36.7 C) (02/14 0555) Pulse Rate:  [64-91] 66  (02/14 0555) Resp:  [16-18] 16  (02/14 0555) BP: (98-158)/(40-78) 158/78 mmHg (02/14 0555) SpO2:  [94 %-98 %] 98 % (02/14 0555) Weight change:  Last BM Date: 01/20/12  Intake/Output from previous day:  Intake/Output Summary (Last 24 hours) at 01/24/12 1108 Last data filed at 01/24/12 0900  Gross per 24 hour  Intake    360 ml  Output      0 ml  Net    360 ml     Physical Exam:  Gen:  NAD Cardiovascular:  RRR, III/VI SEM LUSB Respiratory: Lungs CTAB Gastrointestinal: Abdomen soft, NT/ND with normal active bowel sounds. Extremities: Trace edema     Lab Results: Basic Metabolic  Panel:  Lab 01/24/12 0500 01/23/12 1125 01/20/12 0553 01/19/12 0530 01/18/12 1804 01/18/12 0826  NA 133* -- 131* 130* 128* 123*  K 5.1 -- 3.2* -- -- --  CL 101 -- 98 98 95* 92*  CO2 23 -- 24 23 22 23   GLUCOSE 87 -- 86 82 116* 93  BUN 24* -- 8 15 16 9   CREATININE 0.70 -- 0.55 0.64 0.80 0.54  CALCIUM 9.0 -- 8.3* 8.6 8.1* 8.7  MG -- 1.8 -- -- -- --  PHOS -- -- -- -- -- --   GFR Estimated Creatinine Clearance: 38.6 ml/min (by C-G formula based on Cr of 0.7). Liver Function Tests:  Lab 01/17/12 1435  AST 28  ALT 23  ALKPHOS 104  BILITOT 0.6  PROT 7.8  ALBUMIN 3.8    CBC:  Lab 01/18/12 0826 01/17/12 1754 01/17/12 1435  WBC 6.4 9.89.4 10.4  NEUTROABS -- -- 8.7*  HGB 10.8* 11.7*11.5* 12.2  HCT 30.7* 33.2*32.5* 34.4*  MCV 87.2 87.487.4 87.1  PLT 277 [id number] 333   Cardiac Enzymes:  Lab 01/24/12 0500 01/18/12 0827 01/18/12 0005 01/17/12 1754 01/17/12 1435  CKTOTAL 44 266* 190* 195* --  CKMB 2.8 7.7* 7.3* 7.8* --  CKMBINDEX -- -- -- -- --  TROPONINI -- <0.30 <0.30 <0.30 <0.30   CBG:  Lab 01/18/12 0538  GLUCAP 103*    Ref. Range 01/17/2012 17:54  TSH Latest Range: 0.350-4.500 uIU/mL 3.134   Microbiology Recent Results (from the  past 240 hour(s))  URINE CULTURE     Status: Normal (Preliminary result)   Collection Time   01/22/12  4:07 AM      Component Value Range Status Comment   Specimen Description URINE, RANDOM   Final    Special Requests NONE   Final    Culture  Setup Time [id number]   Final    Colony Count >=100,000 COLONIES/ML   Final    Culture     Final    Value: PROTEUS MIRABILIS     ESCHERICHIA COLI   Report Status PENDING   Incomplete     Recent Results (from the past 240 hour(s))  URINE CULTURE     Status: Normal (Preliminary result)   Collection Time   01/22/12  4:07 AM      Component Value Range Status Comment   Specimen Description URINE, RANDOM   Final    Special Requests NONE   Final    Culture  Setup Time [id number]   Final     Colony Count >=100,000 COLONIES/ML   Final    Culture     Final    Value: PROTEUS MIRABILIS     ESCHERICHIA COLI   Report Status PENDING   Incomplete     Studies/Results: No results found.  Medications: Scheduled Meds:    . amLODipine  10 mg Oral Daily  . atenolol  50 mg Oral Daily  . cefTRIAXone (ROCEPHIN)  IV  1 g Intravenous Q24H  . docusate sodium  100 mg Oral BID  . enoxaparin  40 mg Subcutaneous Q24H  . fluticasone  2 spray Each Nare QHS  . levothyroxine  88 mcg Oral QAC breakfast  . LORazepam  0.5 mg Oral BID  . meloxicam  7.5 mg Oral Daily  . potassium chloride  40 mEq Oral TID  . rOPINIRole  1 mg Oral BID  . rOPINIRole  2 mg Oral BID  . sodium chloride  3 mL Intravenous Q12H  . vitamin B-12  1,000 mcg Oral Daily   Continuous Infusions:  PRN Meds:.acetaminophen, alum & mag hydroxide-simeth, bisacodyl, labetalol, ondansetron (ZOFRAN) IV, ondansetron, oxyCODONE, DISCONTD: acetaminophen, DISCONTD: albuterol Antibiotics: Anti-infectives     Start     Dose/Rate Route Frequency Ordered Stop   01/22/12 0800   cefTRIAXone (ROCEPHIN) 1 g in dextrose 5 % 50 mL IVPB        1 g 100 mL/hr over 30 Minutes Intravenous Every 24 hours 01/22/12 0736             Assessment/Plan:  Principal Problem:  *Altered mental status / Metabolic encephalopathy /  Hyponatremia The patient was found to be confused with a sodium on 119 on admission.  The hyponatremia resolved on a course of several days with IV fluids and with holding her Lasix and Celexa. Acute encephalopathy also resolved. She currently appears to be at baseline, with a mildly low sodium of 133. Active Problems:  Hypothyroidism The patient is appropriately replaced on her current dose of synthroid.  TSH was checked on 01/17/12 and found to be WNL.  Depression with anxiety Celexa has been placed on hold (may have been contributing to hyponatremia).  She does have a depressed affect, and may benefit from re-challenge with  SSRI therapy, but will defer to PCP.  Dehydration The patient was thought to be dehydrated on presentation.  A review of her labs did not show any elevation of specific gravity but her BUN/creatinine ratio was slightly high.  With hydration, her  BUN/creatine ratio has come down.  Her diuretic therapy was also placed on hold.  RLS (restless legs syndrome) The patient has been maintained on her usual dose of Requip.  HTN (hypertension) SBP has been intermittantly high.  She is currently on Tenormin (home dose) with Norvasc added 01/19/12.  We increased Norvasc to 10 mg on 01/23/12.   Lasix remains on hold.  Hypokalemia The patient was on 40 mEq of oral replacement therapy BID.  Changed to TID 01/23/12, due to ongoing hypokalemia.  Magnesium was checked and found to be WNL.  Potassium now borderline high, so will hold further supplementation.  Hip pain, right s/p fall Seen January 28 for knee pain. X-ray demonstrated effusion. Referred for orthopedic evaluation as an outpatient. Right lower extremity vascular study was negative for DVT. She followed up with her orthopedic surgeon Dr. Madelon Lips. Plan was to obtain MRI of low back because of suspected sciatica. Multiple imaging studies were performed which showed severe degenerative disease in her low back. Physical and occupational therapy recommended skilled nursing facility prior to return to assisted living. Although Dr. Madelon Lips was out of town, Dr. Irene Limbo did notify his physician assistant who will discuss the case with him and followup in the outpatient setting.   Normocytic anemia Baseline hemoglobin is 11 mg/dL.  Current hemoglobin is 10.8 mg/dL.  No further work up necessary as inpatient.  Elevated CK / Elevated CK-MB level Done on admission, and may be related to rhabdomyolysis from fall.  Her troponins were negative.  A recheck of the CK and CK-MB on 01/24/12 showed that they had normalized.  Stage 2 CKD Creatinine is stable.  Watch closely since  she is on Mobic.  UTI The patient was placed on Rocephin (day #3).  Urine cultures are preliminary but growing E. Coli and Proteus.  *I spoke with the patient's son, Franky Macho, at the patient's bedside and updated him regarding the patient's condition and plan of care.  I answered all of his questions.    LOS: 7 days   Hillery Aldo, MD Pager [phone]  01/24/2012, 11:08 AM

## 2012-01-25 DIAGNOSIS — E43 Unspecified severe protein-calorie malnutrition: Secondary | ICD-10-CM | POA: Diagnosis present

## 2012-01-25 LAB — BASIC METABOLIC PANEL
BUN: 26 mg/dL — ABNORMAL HIGH (ref 6–23)
Calcium: 9.1 mg/dL (ref 8.4–10.5)
Creatinine, Ser: 0.7 mg/dL (ref 0.50–1.10)
GFR calc Af Amer: 86 mL/min — ABNORMAL LOW (ref >90)
GFR calc non Af Amer: 74 mL/min — ABNORMAL LOW (ref >90)
Glucose, Bld: 86 mg/dL (ref 70–99)

## 2012-01-25 MED ORDER — HYDROCODONE-ACETAMINOPHEN 5-500 MG PO TABS: 1.0000 | ORAL_TABLET | Freq: Two times a day (BID) | ORAL | Status: DC

## 2012-01-25 MED ORDER — ENSURE CLINICAL ST REVIGOR PO LIQD: 237.0000 mL | Freq: Two times a day (BID) | ORAL | Status: DC

## 2012-01-25 MED ORDER — CEFUROXIME AXETIL 250 MG PO TABS: 250.0000 mg | ORAL_TABLET | Freq: Two times a day (BID) | ORAL | Status: AC

## 2012-01-25 NOTE — Discharge Summary (Signed)
Physician Discharge Summary  Patient ID: Laine Fonner MRN: [id number] DOB/AGE: Jan 09, 1919 78 y.o.  Admit date: 01/17/2012 Discharge date: 01/25/2012  Primary Care Physician:  Letitia Libra, Ala Dach, MD, MD   Discharge Diagnoses:    Present on Admission:  .Altered mental status .Hyponatremia .Dehydration .Hypothyroidism .Depression with anxiety .RLS (restless legs syndrome) .HTN (hypertension) .Hypokalemia .Hip pain, right .Metabolic encephalopathy .Normocytic anemia .Elevated CK .Elevated CK-MB level .CKD (chronic kidney disease) stage 2, GFR 60-89 ml/min .UTI (lower urinary tract infection) .Fall .Severe protein-calorie malnutrition  Discharge Medications:  Medication List  As of 01/25/2012 12:29 PM   STOP taking these medications         citalopram 40 MG tablet         TAKE these medications         acetaminophen 325 MG tablet   Commonly known as: TYLENOL   Take 650 mg by mouth every 6 (six) hours as needed. For pain      atenolol 50 MG tablet   Commonly known as: TENORMIN   Take 50 mg by mouth daily.      bisacodyl 5 MG EC tablet   Commonly known as: DULCOLAX   Take 5 mg by mouth daily as needed.      cefUROXime 250 MG tablet   Commonly known as: CEFTIN   Take 1 tablet (250 mg total) by mouth 2 (two) times daily.      feeding supplement Liqd   Take 237 mLs by mouth 2 (two) times daily between meals.      fluticasone 50 MCG/ACT nasal spray   Commonly known as: FLONASE   Place 2 sprays into the nose daily.      furosemide 20 MG tablet   Commonly known as: LASIX   Take 20 mg by mouth daily.      HYDROcodone-acetaminophen 5-500 MG per tablet   Commonly known as: VICODIN   Take 1 tablet by mouth 2 (two) times daily.      levothyroxine 88 MCG tablet   Commonly known as: SYNTHROID, LEVOTHROID   Take 88 mcg by mouth daily.      LORazepam 0.5 MG tablet   Commonly known as: ATIVAN   Take 0.5 mg by mouth 2 (two) times daily.      meloxicam  7.5 MG tablet   Commonly known as: MOBIC   Take 7.5 mg by mouth daily. Take with food.      PRESERVISION AREDS 2 PO   Take 1 tablet by mouth daily.      rOPINIRole 1 MG tablet   Commonly known as: REQUIP   Take 1 mg by mouth 2 (two) times daily. At 2 am and 2 pm      vitamin B-12 1000 MCG tablet   Commonly known as: CYANOCOBALAMIN   Take 1,000 mcg by mouth daily.             Disposition and Follow-up: The patient is being discharged to a SNF for rehab.  She is instructed to follow up with Dr. Redmond School in 2 weeks and with Dr. Madelon Lips in 1 week.   Medical Consults:  None  Other Consults: Social Services: [employer] authorization confirmed to return to SNF at [facility].  Physical Therapy: Needs 2+ assist for safety; High fall risk  Occupational Therapy: SNF recommended Dietician: Pt meets criteria for severe PCM of acute illness AEB 7.1% weight loss in the past 2 months and pt with <50% energy intake in the past week.  Nutritional  supplements ordered.  Significant Diagnostic Studies:   Ct Head Wo Contrast  01/17/2012   IMPRESSION:  1.  No acute intracranial abnormalities. 2.  Moderate (age appropriate) cerebral atrophy. 3.  Probable mild chronic microvascular ischemic changes in the deep and periventricular white matter of the cerebral hemispheres bilaterally.  Original Report Authenticated By: Florencia Reasons, M.D.   Ct Cervical Spine Wo Contrast  01/17/2012  IMPRESSION: 1.  No acute abnormality of the cervical spine. 2.  Multilevel degenerative disc disease and cervical spondylosis, as above. 3.  Probable interstitial pulmonary edema noted in the lung apices bilaterally. 4.  Multiple pulmonary nodules visualized in the lung apices, the largest measuring 5 mm in the right apex.  Appearance is nonspecific, but is favored to be of infectious etiology (findings are asymmetric, greater on the right than on the left), possibly chronic.  Correlation with chest radiograph or chest CT is  recommended at this time.  Original Report Authenticated By: Florencia Reasons, M.D.   Mr Brain Wo Contrast  01/17/2012   IMPRESSION:  1.  No acute intracranial abnormality. 2.  A mild generalized atrophy and diffuse white matter disease. This likely reflects the sequelae of chronic microvascular ischemia. 3.  Right mastoid effusion.  No obstructing nasopharyngeal lesion is evident.  Original Report Authenticated By: Jamesetta Orleans. MATTERN, M.D.   Mr Lumbar Spine Wo Contrast  01/17/2012 IMPRESSION:  1.  Mild left lateral recess and foraminal stenosis at L4-5 and L5- S1 secondary to broad-based disc herniations and facet hypertrophy. 2.  Mild foraminal narrowing at to L2-3 and L3-4 secondary to lateral disc protrusions and short pedicles. 3.  Degenerative retrolisthesis at L1-2 with uncovering of the disc and osteophyte formation.  Mild central and bilateral foraminal stenosis is worse on the right.  Original Report Authenticated By: Jamesetta Orleans. MATTERN, M.D.   Ct Hip Right Wo Contrast  01/17/2012  IMPRESSION:  1.  Although there is some edema in the soft tissues adjacent to the hip, including along the trochanteric bursa, I do not observe a fracture.  There is some spurring of the acetabulum and proximal humerus.  If there is high clinical index of suspicion of occult fracture and if no contraindication such as pacemaker, MRI could provide greater sensitivity and negative predictive value for hip fracture.  Original Report Authenticated By: Dellia Cloud, M.D.    Discharge Laboratory Values: Basic Metabolic Panel:  Lab 01/25/12 7829 01/24/12 0500 01/23/12 1125 01/20/12 0553 01/19/12 0530 01/18/12 1804  NA 132* 133* -- 131* 130* 128*  K 4.6 5.1 -- -- -- --  CL 100 101 -- 98 98 95*  CO2 22 23 -- 24 23 22   GLUCOSE 86 87 -- 86 82 116*  BUN 26* 24* -- 8 15 16   CREATININE 0.70 0.70 -- 0.55 0.64 0.80  CALCIUM 9.1 9.0 -- 8.3* 8.6 8.1*  MG -- -- 1.8 -- -- --  PHOS -- -- -- -- -- --    GFR Estimated Creatinine Clearance: 38.6 ml/min (by C-G formula based on Cr of 0.7).  CBC  Ref. Range 01/18/2012 08:26  WBC Latest Range: 4.0-10.5 K/uL 6.4  RBC Latest Range: 3.87-5.11 MIL/uL 3.52 (L)  HGB Latest Range: 12.0-15.0 g/dL 56.2 (L)  HCT Latest Range: 36.0-46.0 % 30.7 (L)  MCV Latest Range: 78.0-100.0 fL 87.2  MCH Latest Range: 26.0-34.0 pg 30.7  MCHC Latest Range: 30.0-36.0 g/dL 13.0  RDW Latest Range: 11.5-15.5 % 13.1  Platelets Latest Range: 150-400 K/uL 277   Cardiac  Enzymes:  Lab 01/24/12 0500  CKTOTAL 44  CKMB 2.8  CKMBINDEX --  TROPONINI --    Ref. Range 01/17/2012 17:54  TSH Latest Range: 0.350-4.500 uIU/mL 3.134   Microbiology Recent Results (from the past 240 hour(s))  URINE CULTURE     Status: Normal   Collection Time   01/22/12  4:07 AM      Component Value Range Status Comment   Specimen Description URINE, RANDOM   Final    Special Requests NONE   Final    Culture  Setup Time [id number]   Final    Colony Count >=100,000 COLONIES/ML   Final    Culture     Final    Value: PROTEUS MIRABILIS     ESCHERICHIA COLI   Report Status 01/24/2012 FINAL   Final    Organism ID, Bacteria PROTEUS MIRABILIS   Final    Organism ID, Bacteria ESCHERICHIA COLI   Final      Brief H and P: For complete details please refer to admission H and P, but in brief, Mrs. Minardi Mrs. Neuroth is a 78 year old female who presented to the emergency department from assisted living with mechanical fall secondary to right hip pain. She was noted to have a sodium of 119, confusion and referred for admission. She also had recurrent encephalopathy that was thought to have been triggered by a UTI, based on initial urinalysis.  Physical Exam at Discharge: BP 137/77  Pulse 82  Temp(Src) 97.7 F (36.5 C) (Oral)  Resp 20  Ht 5\' 1"  (1.549 m)  Wt 58.968 kg (130 lb)  BMI 24.56 kg/m2  SpO2 95% Gen:  NAD Cardiovascular:  RRR, No M/R/G Respiratory: Lungs  CTAB Gastrointestinal: Abdomen soft, NT/ND with normal active bowel sounds. Extremities: Trace edema  Hospital Course:  Principal Problem:  *Altered mental status / Metabolic encephalopathy / Hyponatremia  The patient was found to be confused with a sodium on 119 on admission. The hyponatremia resolved on a course of several days with IV fluids and with holding her Lasix and Celexa. Acute encephalopathy also resolved. She currently appears to be at baseline, with a mildly low sodium of 132  Active Problems:  Hypothyroidism  The patient is appropriately replaced on her current dose of synthroid. TSH was checked on 01/17/12 and found to be WNL.  Depression with anxiety  Celexa has been placed on hold (may have been contributing to hyponatremia). She does have a depressed affect, and may benefit from re-challenge with SSRI therapy, but will defer to PCP.  Dehydration  The patient was thought to be dehydrated on presentation. A review of her labs did not show any elevation of specific gravity but her BUN/creatinine ratio was slightly high. With hydration, her BUN/creatine ratio has come down. Her diuretic therapy was also placed on hold. This can be resumed at discharge. RLS (restless legs syndrome)  The patient has been maintained on her usual dose of Requip.  HTN (hypertension)  SBP has been intermittantly high. She is currently on Tenormin (home dose) with Norvasc added 01/19/12. We increased Norvasc to 10 mg on 01/23/12. Will d/c Norvasc and resume Lasix at discharge. Hypokalemia  The patient was on 40 mEq of oral replacement therapy BID. Changed to TID 01/23/12, due to ongoing hypokalemia. Magnesium was checked and found to be WNL. Potassium now borderline high, so we held further supplementation. May need to resume supplementation with resumption of lasix.  Recommend recheck of potassium in 24-48 hours. Hip pain, right  s/p fall  Seen January 28 for knee pain. X-ray demonstrated effusion. Referred  for orthopedic evaluation as an outpatient. Right lower extremity vascular study was negative for DVT. She followed up with her orthopedic surgeon Dr. Madelon Lips. Plan was to obtain MRI of low back because of suspected sciatica. Multiple imaging studies were performed which showed severe degenerative disease in her low back. Physical and occupational therapy recommended skilled nursing facility prior to return to assisted living. Although Dr. Madelon Lips was out of town, Dr. Irene Limbo did notify his physician assistant who will discuss the case with him and followup in the outpatient setting.  Normocytic anemia  Baseline hemoglobin is 11 mg/dL. Current hemoglobin is 10.8 mg/dL. No further work up necessary as inpatient.  Elevated CK / Elevated CK-MB level  Done on admission, and may be related to rhabdomyolysis from fall. Her troponins were negative. A recheck of the CK and CK-MB on 01/24/12 showed that they had normalized.  Stage 2 CKD  Creatinine is stable. Watch closely since she is on Mobic.  UTI  The patient was placed on Rocephin (day #4). Urine cultures grew E. Coli and Proteus, both of which were sensitive to Rocephin.  Will d/c on an additional 3 days of treatment with Ceftin.   Severe protein-calorie malnutrition Seen by the dietician on 01/25/12.  Pt meets criteria for severe PCM of acute illness AEB 7.1% weight loss in the past 2 months and pt with <50% energy intake in the past week. Supplements ordered.  Recommendations for hospital follow-up: 1. F/U BMET (potassium, sodium) in 24-48 hours. 2. F/U with orthopedic surgeon as noted above.  Diet:  Heart healthy  Activity:  Increase activity slowly  Condition at Discharge:   Improved  Time spent on Discharge:  40 minutes  Signed: Dr. Trula Ore Paityn Balsam Pager [phone] 01/25/2012, 12:29 PM

## 2012-01-25 NOTE — Progress Notes (Signed)
Patient cleared for discharge. Patient accepted at [facility] place and they have obtained auth. Packet copied and placed in [place]. Son at bedside, aware and agreeable to transfer. Patient also agreeable. [facility] called for transportation. No further CSW needs noted.  [name] [name] [name] [name] MSW, LCSW [phone]

## 2012-01-25 NOTE — Progress Notes (Signed)
INITIAL ADULT NUTRITION ASSESSMENT Date: 01/25/2012   Time: 10:51 AM Reason for Assessment: Poor intake  ASSESSMENT: Female 78 y.o.  Dx: Altered mental status  Hx:  Past Medical History  Diagnosis Date  . Arthritis   . History of chicken pox   . Depression     husband died Sep 23, 2011  . Glaucoma   . Thyroid disease   . Hypertension    Related Meds:  Scheduled Meds:   . amLODipine  10 mg Oral Daily  . atenolol  50 mg Oral Daily  . cefTRIAXone (ROCEPHIN)  IV  1 g Intravenous Q24H  . docusate sodium  100 mg Oral BID  . enoxaparin  40 mg Subcutaneous Q24H  . fluticasone  2 spray Each Nare QHS  . levothyroxine  88 mcg Oral QAC breakfast  . LORazepam  0.5 mg Oral BID  . meloxicam  7.5 mg Oral Daily  . rOPINIRole  1 mg Oral BID  . rOPINIRole  2 mg Oral BID  . sodium chloride  3 mL Intravenous Q12H  . vitamin B-12  1,000 mcg Oral Daily  . DISCONTD: potassium chloride  40 mEq Oral TID   Continuous Infusions:  PRN Meds:.acetaminophen, alum & mag hydroxide-simeth, bisacodyl, labetalol, ondansetron (ZOFRAN) IV, ondansetron, oxyCODONE  Ht: 5\' 1"  (154.9 cm)  Wt: 130 lb (58.968 kg)  Ideal Wt: 47.7kg % Ideal Wt: 123  Usual Wt: 63.6kg % Usual Wt: 93  Body mass index is 24.56 kg/(m^2).  Food/Nutrition Related Hx: Pt admitted with AMS, however MRI was negative for acute abnormality. Pt reports unintentional weight loss in the past 2 months r/t lack of appetite. Pt states she has never been a big eater. Pt denies any problems chewing or swallowing foods/liquids, but pt states she used to have problems swallowing pills, but can swallow them without problems if they are given in applesauce.   Labs:  CMP     Component Value Date/Time   NA 132* 01/25/2012 0455   K 4.6 01/25/2012 0455   CL 100 01/25/2012 0455   CO2 22 01/25/2012 0455   GLUCOSE 86 01/25/2012 0455   BUN 26* 01/25/2012 0455   CREATININE 0.70 01/25/2012 0455   CREATININE 0.74 12/24/2011 1502   CALCIUM 9.1 01/25/2012 0455   PROT 7.8 01/17/2012 1435   ALBUMIN 3.8 01/17/2012 1435   AST 28 01/17/2012 1435   ALT 23 01/17/2012 1435   ALKPHOS 104 01/17/2012 1435   BILITOT 0.6 01/17/2012 1435   GFRNONAA 74* 01/25/2012 0455   GFRAA 86* 01/25/2012 0455    Intake/Output Summary (Last 24 hours) at 01/25/12 1101 Last data filed at 01/25/12 1041  Gross per 24 hour  Intake    393 ml  Output    200 ml  Net    193 ml   Last BM - 01/24/12  Diet Order: General   IVF:    Estimated Nutritional Needs:   Kcal:1500-1800 Protein:70-90g Fluid:1.5-1.8L  NUTRITION DIAGNOSIS: -Inadequate oral intake (NI-2.1).  Status: Ongoing -Pt meets criteria for severe PCM of acute illness AEB 7.1% weight loss in the past 2 months and pt with <50% energy intake in the past week   RELATED TO: poor appetite  AS EVIDENCE BY: <75% meal intake, pt statement, wt loss PTA  MONITORING/EVALUATION(Goals): Pt to consume >75% of meals/supplements.   EDUCATION NEEDS: -No education needs identified at this time  INTERVENTION: Chocolate Ensure Clinical Strength BID. Encouraged increased intake. Will monitor.   Dietitian [phone]  DOCUMENTATION CODES Per approved criteria  -Severe  malnutrition in the context of acute illness or injury    Marshall Cork 01/25/2012, 10:51 AM

## 2012-01-29 ENCOUNTER — Telehealth: Payer: Self-pay | Admitting: Internal Medicine

## 2012-01-29 NOTE — Telephone Encounter (Signed)
Still seeing pt's. Can you check?

## 2012-01-29 NOTE — Telephone Encounter (Signed)
Son and daughter in law want to talk to Dr hodgin about a fall Felicity had at [facility].  They spent 7 hours at the er with her confused.  Her sodium level was critically low .  She spent a week in the hospital and developed a UTI  She had an MRI and they saw a pinched nerve.  She was scheduled for physical therapy and she was released from the hospital to the [facility] on [address].  She is still coming and going with the confusion.  She has been unable to walk since she was here for her last visit with Dr Rodena Medin.  Husband will be home between 4:30 and 5:00.  They would like guidance for what to do.

## 2012-01-30 NOTE — Telephone Encounter (Signed)
Call returned to patient [phone], Spoke with patients son [name] [name]. Patient is currently in rehab and he is concerned with the current medications, because patient seems to be more confused and disoriented. He stated that he is unable to bring patient in for appointment due to mobility issues; she is unable to go in and out of the car. He was advised to obtain patients current medication list and call back. He has verbalized understanding and agrees.

## 2012-02-01 NOTE — Telephone Encounter (Signed)
No return phone call from patients son regarding medication status.

## 2012-02-14 ENCOUNTER — Telehealth: Payer: Self-pay | Admitting: Internal Medicine

## 2012-02-14 NOTE — Telephone Encounter (Signed)
000

## 2012-02-15 ENCOUNTER — Other Ambulatory Visit: Payer: Self-pay | Admitting: *Deleted

## 2012-02-15 NOTE — Telephone Encounter (Signed)
[name] [name] with [facility] call requesting a Rx sent to [facility] for patients ativan. She stated they need a hard copy to the pharmacy. The patient returned to the facility yesterday.

## 2012-02-15 NOTE — Telephone Encounter (Signed)
ok 

## 2012-02-18 ENCOUNTER — Telehealth: Payer: Self-pay | Admitting: Internal Medicine

## 2012-02-18 ENCOUNTER — Ambulatory Visit (HOSPITAL_BASED_OUTPATIENT_CLINIC_OR_DEPARTMENT_OTHER)
Admission: RE | Admit: 2012-02-18 | Discharge: 2012-02-18 | Disposition: A | Discharge status: Home / Self Care | Payer: Medicare Other | Source: Ambulatory Visit | Attending: Internal Medicine | Admitting: Internal Medicine

## 2012-02-18 ENCOUNTER — Encounter: Payer: Self-pay | Admitting: Internal Medicine

## 2012-02-18 ENCOUNTER — Ambulatory Visit (INDEPENDENT_AMBULATORY_CARE_PROVIDER_SITE_OTHER): Payer: Medicare Other | Admitting: Internal Medicine

## 2012-02-18 DIAGNOSIS — R059 Cough, unspecified: Secondary | ICD-10-CM | POA: Insufficient documentation

## 2012-02-18 DIAGNOSIS — R05 Cough: Secondary | ICD-10-CM

## 2012-02-18 DIAGNOSIS — R918 Other nonspecific abnormal finding of lung field: Secondary | ICD-10-CM

## 2012-02-18 DIAGNOSIS — F341 Dysthymic disorder: Secondary | ICD-10-CM

## 2012-02-18 DIAGNOSIS — D649 Anemia, unspecified: Secondary | ICD-10-CM

## 2012-02-18 DIAGNOSIS — F418 Other specified anxiety disorders: Secondary | ICD-10-CM

## 2012-02-18 DIAGNOSIS — I517 Cardiomegaly: Secondary | ICD-10-CM | POA: Insufficient documentation

## 2012-02-18 DIAGNOSIS — E871 Hypo-osmolality and hyponatremia: Secondary | ICD-10-CM

## 2012-02-18 DIAGNOSIS — R0989 Other specified symptoms and signs involving the circulatory and respiratory systems: Secondary | ICD-10-CM | POA: Insufficient documentation

## 2012-02-18 DIAGNOSIS — F329 Major depressive disorder, single episode, unspecified: Secondary | ICD-10-CM

## 2012-02-18 LAB — CBC WITH DIFFERENTIAL/PLATELET
Basophils Absolute: 0.1 10*3/uL (ref 0.0–0.1)
Basophils Relative: 1 % (ref 0–1)
Eosinophils Absolute: 0.2 10*3/uL (ref 0.0–0.7)
Hemoglobin: 10.6 g/dL — ABNORMAL LOW (ref 12.0–15.0)
MCH: 31.3 pg (ref 26.0–34.0)
MCHC: 33.4 g/dL (ref 30.0–36.0)
Monocytes Relative: 11 % (ref 3–12)
Neutro Abs: 3.9 10*3/uL (ref 1.7–7.7)
Neutrophils Relative %: 63 % (ref 43–77)
Platelets: 293 10*3/uL (ref 150–400)
RDW: 14.6 % (ref 11.5–15.5)

## 2012-02-18 LAB — BASIC METABOLIC PANEL
BUN: 11 mg/dL (ref 6–23)
Calcium: 8.8 mg/dL (ref 8.4–10.5)
Glucose, Bld: 79 mg/dL (ref 70–99)
Sodium: 136 mEq/L (ref 135–145)

## 2012-02-18 MED ORDER — DOXYCYCLINE HYCLATE 100 MG PO TABS: 100.0000 mg | ORAL_TABLET | Freq: Two times a day (BID) | ORAL | Status: DC

## 2012-02-18 MED ORDER — CITALOPRAM HYDROBROMIDE 20 MG PO TABS: 20.0000 mg | ORAL_TABLET | Freq: Every day | ORAL | Status: DC

## 2012-02-18 NOTE — Telephone Encounter (Signed)
ok 

## 2012-02-18 NOTE — Telephone Encounter (Signed)
Call returned to [facility] with [facility] at [phone], She is being provided home health physical therapy, and they are requesting a order to resume home physical therapy since she has returned to the [facility]. They are requesting three time per week for one week and two times a week for 3 weeks.

## 2012-02-18 NOTE — Telephone Encounter (Signed)
She is to be seen three times a week for 1 week and then 2 times a week for 3 weeks. And then she will be recerted

## 2012-02-18 NOTE — Assessment & Plan Note (Signed)
Obtain cxr. Begin abx. Followup if no improvement or worsening.

## 2012-02-18 NOTE — Progress Notes (Signed)
  Subjective:    Patient ID: [name] [name], female    DOB: 1919-01-11, 78 y.o.   MRN: [id number]  HPI Pt presents to clinic for hospital follow up of multiple problems. Recently hospitalized with UTI, mental status changes and hyponatremia. Had difficulty ambulating and was transferred to rehab. Just released and ambulating with walker. Mental status currently appears baseline. During hospitalization celexa was held during hyponatremia. Mood has suffered and now feels depressed. Has vivid dreams at night that are disturbing. Notes one + week h/o cough productive for clear sputum with associated nasal drainage. No obvious f/c. Taking no medication for the problem.   Past Medical History  Diagnosis Date  . Arthritis   . History of chicken pox   . Depression     husband died 09-17-11  . Glaucoma   . Thyroid disease   . Hypertension    Past Surgical History  Procedure Date  . Revision total knee arthroplasty     right knee  . Back surgery   . Cataract extraction, bilateral   . Thyroidectomy   . Abdominal hysterectomy     reports that she has never smoked. She has never used smokeless tobacco. She reports that she does not drink alcohol or use illicit drugs. family history is not on file. Allergies  Allergen Reactions  . Codeine Other (See Comments)    insomnia  . Fentanyl Hives and Rash  . Lisinopril Nausea And Vomiting, Rash and Hypertension     Review of Systems see hpi     Objective:   Physical Exam  Nursing note and vitals reviewed. Constitutional: She appears well-developed and well-nourished. No distress.  HENT:  Head: Normocephalic and atraumatic.  Right Ear: External ear normal.  Left Ear: External ear normal.  Eyes: Conjunctivae are normal. No scleral icterus.  Neck: Neck supple. No JVD present.  Cardiovascular: Normal rate, regular rhythm and normal heart sounds.   Pulmonary/Chest: Effort normal and breath sounds normal. No respiratory distress. She has no  wheezes. She has no rales.  Neurological: She is alert.  Skin: She is not diaphoretic.  Psychiatric: She exhibits a depressed mood.          Assessment & Plan:

## 2012-02-18 NOTE — Assessment & Plan Note (Addendum)
Resume celexa 20mg  po qd. Increase nightime ativan dose from 0.5mg  to 1mg . Pt and family interested in therapist referral

## 2012-02-18 NOTE — Assessment & Plan Note (Signed)
Obtain chem7 

## 2012-02-18 NOTE — Assessment & Plan Note (Signed)
Obtain cbc  

## 2012-02-19 ENCOUNTER — Telehealth: Payer: Self-pay | Admitting: *Deleted

## 2012-02-19 DIAGNOSIS — R05 Cough: Secondary | ICD-10-CM

## 2012-02-19 MED ORDER — LORAZEPAM 0.5 MG PO TABS: ORAL_TABLET | ORAL | Status: DC

## 2012-02-19 MED ORDER — AMOXICILLIN 875 MG PO TABS: 875.0000 mg | ORAL_TABLET | Freq: Two times a day (BID) | ORAL | Status: DC

## 2012-02-19 NOTE — Telephone Encounter (Signed)
Rx faxed to [facility] @ [phone].

## 2012-02-19 NOTE — Telephone Encounter (Signed)
Call placed to [facility] (physical therapist) with [facility] [phone], no answer. A detailed voice message was left informing patient of verbal PT order per Dr Rodena Medin okay.

## 2012-02-19 NOTE — Telephone Encounter (Signed)
Call placed to Bev at [phone],she was informed per Dr Rodena Medin instructions. Rx faxed to [facility] [phone], and communication order faxed to [phone].Call placed to [facility] at [phone], spoke with Dorinda Hill he was made aware of medication changes per Dr Rodena Medin instructions.

## 2012-02-19 NOTE — Telephone Encounter (Signed)
Patients daughter in law Bev called and left voice message stating patient has contacted her twice today with complaints of upset stomach and visual disturbance. Bev stated the only thing different was the antibiotic. She would like to know if that could be changed to something different.

## 2012-02-19 NOTE — Telephone Encounter (Signed)
Dc doxy. Begin amox 875mg  bid x7days if not allergic and no interactions. pls let them know sodium level and kidney tests nl

## 2012-02-25 ENCOUNTER — Telehealth: Payer: Self-pay | Admitting: *Deleted

## 2012-02-25 MED ORDER — DOXYCYCLINE HYCLATE 100 MG PO TABS: 100.0000 mg | ORAL_TABLET | Freq: Two times a day (BID) | ORAL | Status: DC

## 2012-02-25 NOTE — Telephone Encounter (Signed)
Called placed to patient at [phone], spoke with patients daughter in law Bev. She was informed Dr Rodena Medin is going to extend treatment of antibiotics for an additional 5 days, and patient can follow up as needed. She has verbalized understanding and agrees as instructed.  Antibiotic Rx for additional 5 days was faxed to [facility] [phone]. Call placed to  [facility] at [phone], spoke with [place], she stated she unable to locate the individual taking care of patient. She was informed call was non urgent that I will call back tomorrow with instructions.

## 2012-02-25 NOTE — Telephone Encounter (Signed)
Extend 5 days pls

## 2012-02-25 NOTE — Telephone Encounter (Signed)
Patients daughter in law called and left voice message stating the patient is some better,cough has improved, however she still seems to be congested. She stated patient is scheduled to complete the antibiotic on Wednesday and would like to know if she could take the antibiotics a little longer.

## 2012-02-26 NOTE — Telephone Encounter (Signed)
Call placed to [facility], spoke with Zella Ball, she stated med nurse [name] was not available. Message was left for [name] informing her of additional days for antibiotic. Zella Ball was advised to have [name] call back if any questions. Zella Ball has verbalized understanding and stated she will relay the message.

## 2012-02-27 ENCOUNTER — Telehealth: Payer: Self-pay | Admitting: Internal Medicine

## 2012-02-27 DIAGNOSIS — R05 Cough: Secondary | ICD-10-CM

## 2012-02-27 MED ORDER — AMOXICILLIN 875 MG PO TABS: 875.0000 mg | ORAL_TABLET | Freq: Two times a day (BID) | ORAL | Status: AC

## 2012-02-27 NOTE — Telephone Encounter (Signed)
Rx refaxed to [facility] at [phone], and also sent to [facility]. Attention of Loretta at [phone].  Call placed to [facility] at [phone], spoke with [facility]. Clarification was provided on Rx for antibiotic. The patient reported problems with the doxycyline and the medication was changed to Amoxicillin. She was informed Patient should be taking additional 5 days of Amoxicillin instead of doxycyline.  [name] has verbalized understanding. She was informed the Rx for Amoxicillin would be called to [facility]. Call placed to [facility] [phone], a verbal order was given to Dante at [facility] for the additional 5 day of Amoxicillin. [name] with [facility] has been advised at [phone] of medication to pharmacy.

## 2012-02-27 NOTE — Telephone Encounter (Signed)
[name] [name] from the [facility] states that the pharmacy did not receive the new prescription of amoxicillan. Please refax to pharmacy at [phone].  Or  You can fax prescription to Loretta(caregiver) at [phone]

## 2012-02-29 ENCOUNTER — Telehealth: Payer: Self-pay | Admitting: *Deleted

## 2012-02-29 DIAGNOSIS — E43 Unspecified severe protein-calorie malnutrition: Secondary | ICD-10-CM

## 2012-02-29 NOTE — Telephone Encounter (Signed)
Call returned to [facility] at  [phone], spoke with Aurther Loft. She stated the previous order did not state a frequency.

## 2012-02-29 NOTE — Telephone Encounter (Signed)
Call received from [place] at [facility]. She stated patient was discharged from the hospital with a order for medpass nutritional supplement. She stated their pharmacy does not carry that and she would like to know if it could be switched to Ensure, Boost or Mighty Shake. She states she will also need directions if Rx change approved.Marland Kitchen

## 2012-02-29 NOTE — Telephone Encounter (Signed)
Order for nutritional supplement communicated via fax to [facility] at [phone].

## 2012-02-29 NOTE — Telephone Encounter (Signed)
Ok to switch to ensure. Same frequency as original order

## 2012-03-04 ENCOUNTER — Telehealth: Payer: Self-pay | Admitting: *Deleted

## 2012-03-04 NOTE — Telephone Encounter (Signed)
Patients daughter inlaw called and left voice message stating patient is in the process of completing the second antibiotics and she does not seem to be doing any better. She feels the patient should be seen prior to her April 9th appointment and would like to know what Dr Rodena Medin advises.

## 2012-03-04 NOTE — Telephone Encounter (Signed)
appt

## 2012-03-04 NOTE — Telephone Encounter (Signed)
Call placed to patients daughter at [phone], no answer. A detailed voice message was left informing Bev patient will need appointment. Message was left advising patient to schedule for next week; allowing second round of antibiotics to work.

## 2012-03-10 ENCOUNTER — Ambulatory Visit (INDEPENDENT_AMBULATORY_CARE_PROVIDER_SITE_OTHER): Payer: No Typology Code available for payment source | Admitting: Psychology

## 2012-03-10 DIAGNOSIS — F4321 Adjustment disorder with depressed mood: Secondary | ICD-10-CM

## 2012-03-11 ENCOUNTER — Ambulatory Visit: Payer: No Typology Code available for payment source | Admitting: Internal Medicine

## 2012-03-14 ENCOUNTER — Ambulatory Visit: Payer: No Typology Code available for payment source | Admitting: Internal Medicine

## 2012-03-17 ENCOUNTER — Emergency Department (HOSPITAL_COMMUNITY)
Admission: EM | Admit: 2012-03-17 | Discharge: 2012-03-17 | Disposition: A | Discharge status: Home / Self Care | Payer: Medicare Other | Attending: Emergency Medicine | Admitting: Emergency Medicine

## 2012-03-17 ENCOUNTER — Emergency Department (HOSPITAL_COMMUNITY): Payer: Medicare Other

## 2012-03-17 ENCOUNTER — Encounter (HOSPITAL_COMMUNITY): Payer: Self-pay | Admitting: Emergency Medicine

## 2012-03-17 DIAGNOSIS — M545 Low back pain, unspecified: Secondary | ICD-10-CM | POA: Insufficient documentation

## 2012-03-17 DIAGNOSIS — W19XXXA Unspecified fall, initial encounter: Secondary | ICD-10-CM

## 2012-03-17 DIAGNOSIS — W010XXA Fall on same level from slipping, tripping and stumbling without subsequent striking against object, initial encounter: Secondary | ICD-10-CM | POA: Insufficient documentation

## 2012-03-17 DIAGNOSIS — I10 Essential (primary) hypertension: Secondary | ICD-10-CM | POA: Insufficient documentation

## 2012-03-17 DIAGNOSIS — M62838 Other muscle spasm: Secondary | ICD-10-CM | POA: Insufficient documentation

## 2012-03-17 DIAGNOSIS — M899 Disorder of bone, unspecified: Secondary | ICD-10-CM | POA: Insufficient documentation

## 2012-03-17 DIAGNOSIS — IMO0002 Reserved for concepts with insufficient information to code with codable children: Secondary | ICD-10-CM

## 2012-03-17 DIAGNOSIS — R079 Chest pain, unspecified: Secondary | ICD-10-CM | POA: Insufficient documentation

## 2012-03-17 DIAGNOSIS — M949 Disorder of cartilage, unspecified: Secondary | ICD-10-CM | POA: Insufficient documentation

## 2012-03-17 DIAGNOSIS — T148XXA Other injury of unspecified body region, initial encounter: Secondary | ICD-10-CM | POA: Insufficient documentation

## 2012-03-17 DIAGNOSIS — Z79899 Other long term (current) drug therapy: Secondary | ICD-10-CM | POA: Insufficient documentation

## 2012-03-17 DIAGNOSIS — M549 Dorsalgia, unspecified: Secondary | ICD-10-CM | POA: Insufficient documentation

## 2012-03-17 MED ORDER — HYDROCODONE-ACETAMINOPHEN 5-325 MG PO TABS: 2.0000 | ORAL_TABLET | ORAL | Status: AC | PRN

## 2012-03-17 MED ORDER — HYDROCODONE-ACETAMINOPHEN 5-325 MG PO TABS
1.0000 | ORAL_TABLET | Freq: Once | ORAL | Status: AC
  Administered 2012-03-17: 1 via ORAL
  Filled 2012-03-17: qty 1

## 2012-03-17 NOTE — ED Provider Notes (Signed)
History     CSN: [id number]  Arrival date & time 03/17/12  1141   First MD Initiated Contact with Patient 03/17/12 1225      Chief Complaint  Patient presents with  . Fall    last night 2:30-3am  . Back Pain    (Consider location/radiation/quality/duration/timing/severity/associated sxs/prior treatment) Patient is a 78 y.o. female presenting with fall and back pain. The history is provided by the patient and a relative.  Fall  Back Pain    patient here with pain to her lower back and chest after a fall last night. Patient was using her walker when she lost her balance and fell and struck the wall and slid onto the floor. No loss of consciousness. No symptoms of chest pain, shortness of breath, abdominal pain, palpitations prior to the event. Notes pain to her lower lumbar spine without radicular symptoms. Denies any head or neck pain. Does note some midsternal pain worse with movement. No medications taken prior to arrival  Past Medical History  Diagnosis Date  . Arthritis   . History of chicken pox   . Depression     husband died 09/29/2011  . Glaucoma   . Thyroid disease   . Hypertension     Past Surgical History  Procedure Date  . Revision total knee arthroplasty     right knee  . Back surgery   . Cataract extraction, bilateral   . Thyroidectomy   . Abdominal hysterectomy     History reviewed. No pertinent family history.  History  Substance Use Topics  . Smoking status: Never Smoker   . Smokeless tobacco: Never Used  . Alcohol Use: No    OB History    Grav Para Term Preterm Abortions TAB SAB Ect Mult Living                  Review of Systems  Musculoskeletal: Positive for back pain.  All other systems reviewed and are negative.    Allergies  Codeine; Doxycycline; Fentanyl; and Lisinopril  Home Medications   Current Outpatient Rx  Name Route Sig Dispense Refill  . ACETAMINOPHEN 325 MG PO TABS Oral Take 650 mg by mouth every 6 (six) hours as  needed. For pain    . ATENOLOL 50 MG PO TABS Oral Take 50 mg by mouth daily.      Marland Kitchen BISACODYL 5 MG PO TBEC Oral Take 5 mg by mouth daily as needed.    Marland Kitchen CITALOPRAM HYDROBROMIDE 20 MG PO TABS Oral Take 1 tablet (20 mg total) by mouth daily. 30 tablet 6  . ENSURE PO LIQD Oral Take 1 Can by mouth 3 (three) times daily between meals.    Marland Kitchen FLUTICASONE PROPIONATE 50 MCG/ACT NA SUSP Nasal Place 2 sprays into the nose daily.    . FUROSEMIDE 20 MG PO TABS Oral Take 20 mg by mouth daily.      Marland Kitchen HYDROCODONE-ACETAMINOPHEN 5-500 MG PO TABS Oral Take 1 tablet by mouth 2 (two) times daily. 30 tablet 0  . LEVOTHYROXINE SODIUM 88 MCG PO TABS Oral Take 88 mcg by mouth daily.      Marland Kitchen LORAZEPAM 0.5 MG PO TABS  Take 0.5 mg in the am and 1 mg in the evening 45 tablet 3  . MELOXICAM 7.5 MG PO TABS Oral Take 7.5 mg by mouth daily. Take with food.    Marland Kitchen PRESERVISION AREDS 2 PO Oral Take 1 tablet by mouth daily.    Marland Kitchen ROPINIROLE HCL 1 MG  PO TABS Oral Take 1 mg by mouth 2 (two) times daily. At 2 am and 2 pm    . VITAMIN B-12 1000 MCG PO TABS Oral Take 1,000 mcg by mouth daily.      BP 167/63  Pulse 63  Temp(Src) 98.8 F (37.1 C) (Oral)  Resp 14  SpO2 96%  Physical Exam  Nursing note and vitals reviewed. Constitutional: She is oriented to person, place, and time. She appears well-developed and well-nourished.  Non-toxic appearance. No distress.  HENT:  Head: Normocephalic and atraumatic.  Eyes: Conjunctivae, EOM and lids are normal. Pupils are equal, round, and reactive to light.  Neck: Normal range of motion. Neck supple. No tracheal deviation present. No mass present.  Cardiovascular: Normal rate, regular rhythm and normal heart sounds.  Exam reveals no gallop.   No murmur heard. Pulmonary/Chest: Effort normal and breath sounds normal. No stridor. No respiratory distress. She has no decreased breath sounds. She has no wheezes. She has no rhonchi. She has no rales.    Abdominal: Soft. Normal appearance and  bowel sounds are normal. She exhibits no distension. There is no tenderness. There is no rebound and no CVA tenderness.  Musculoskeletal: Normal range of motion. She exhibits no edema and no tenderness.       Right shoulder: She exhibits spasm. She exhibits no bony tenderness.       Arms: Neurological: She is alert and oriented to person, place, and time. She has normal strength. No cranial nerve deficit or sensory deficit. GCS eye subscore is 4. GCS verbal subscore is 5. GCS motor subscore is 6.  Skin: Skin is warm and dry. No abrasion and no rash noted.  Psychiatric: She has a normal mood and affect. Her speech is normal and behavior is normal.    ED Course  Procedures (including critical care time)  Labs Reviewed - No data to display No results found.   No diagnosis found.    MDM  Patient's x-rays results noted. No neurological findings. Patient stable for discharge        Toy Baker, MD 03/17/12 1437

## 2012-03-17 NOTE — ED Notes (Signed)
States fell backwards last night while in bathroom, around 2:30-3am--lives in assisted living facility, pulled call light and was helped up by 2 aides. No bruises noted on back or head. 2 person  Assist to transfer from w/c to ED stretcher.

## 2012-03-17 NOTE — Discharge Instructions (Signed)
Back, Compression Fracture  A compression fracture happens when a force is put upon the length of your spine. Slipping and falling on your bottom are examples of such a force. When this happens, sometimes the force is great enough to compress the building blocks (vertebral bodies) of your spine. Although this causes a lot of pain, this can usually be treated at home, unless your caregiver feels hospitalization is needed for pain control.  Your backbone (spinal column) is made up of 24 main vertebral bodies in addition to the sacrum and coccyx (see illustration). These are held together by tough fibrous tissues (ligaments) and by support of your muscles. Nerve roots pass through the openings between the vertebrae. A sudden wrenching move, injury, or a fall may cause a compression fracture of one of the vertebral bodies. This may result in back pain or spread of pain into the belly (abdomen), the buttocks, and down the leg into the foot. Pain may also be created by muscle spasm alone.  Large studies have been undertaken to determine the best possible course of action to help your back following injury and also to prevent future problems. The recommendations are as follows.  FOLLOWING A COMPRESSION FRACTURE:  Do the following only if advised by your caregiver.    If a back brace has been suggested or provided, wear it as directed.   DO NOT stop wearing the back brace unless instructed by your caregiver.   When allowed to return to regular activities, avoid a sedentary life style. Actively exercise. Sporadic weekend binges of tennis, racquetball, water skiing, may actually aggravate or create problems, especially if you are not in condition for that activity.   Avoid sports requiring sudden body movements until you are in condition for them. Swimming and walking are safer activities.   Maintain good posture.   Avoid obesity.   If not already done, you should have a DEXA scan. Based on the results, be treated for  osteoporosis.  FOLLOWING ACUTE (SUDDEN) INJURY:   Only take over-the-counter or prescription medicines for pain, discomfort, or fever as directed by your caregiver.   Use bed rest for only the most extreme acute episode. Prolonged bed rest may aggravate your condition. Ice used for acute conditions is effective. Use a large plastic bag filled with ice. Wrap it in a towel. This also provides excellent pain relief. This may be continuous. Or use it for 30 minutes every 2 hours during acute phase, then as needed. Heat for 30 minutes prior to activities is helpful.   As soon as the acute phase (the time when your back is too painful for you to do normal activities) is over, it is important to resume normal activities and work hardening programs. Back injuries can cause potentially marked changes in lifestyle. So it is important to attack these problems aggressively.   See your caregiver for continued problems. He or she can help or refer you for appropriate exercises, physical therapy and work hardening if needed.   If you are given narcotic medications for your condition, for the next 24 hours DO NOT:   Drive   Operate machinery or power tools.   Sign legal documents.   DO NOT drink alcohol, take sleeping pills or other medications that may interfere with treatment.  If your caregiver has given you a follow-up appointment, it is very important to keep that appointment. Not keeping the appointment could result in a chronic or permanent injury, pain, and disability. If there is any   problem keeping the appointment, you must call back to this facility for assistance.   SEEK IMMEDIATE MEDICAL CARE IF:   You develop numbness, tingling, weakness, or problems with the use of your arms or legs.   You develop severe back pain not relieved with medications.   You have changes in bowel or bladder control.   You have increasing pain in any areas of the body.  Document Released: 11/26/2005 Document Revised: 11/15/2011  Document Reviewed: 06/30/2008  [employer] Patient Information 2012 [employer].

## 2012-03-18 ENCOUNTER — Ambulatory Visit (INDEPENDENT_AMBULATORY_CARE_PROVIDER_SITE_OTHER): Payer: Medicare Other | Admitting: Internal Medicine

## 2012-03-18 ENCOUNTER — Encounter: Payer: Self-pay | Admitting: Internal Medicine

## 2012-03-18 VITALS — BP 108/60 | HR 59 | Temp 98.2°F | Resp 16

## 2012-03-18 DIAGNOSIS — R079 Chest pain, unspecified: Secondary | ICD-10-CM

## 2012-03-18 DIAGNOSIS — IMO0002 Reserved for concepts with insufficient information to code with codable children: Secondary | ICD-10-CM | POA: Insufficient documentation

## 2012-03-18 DIAGNOSIS — R0789 Other chest pain: Secondary | ICD-10-CM

## 2012-03-18 DIAGNOSIS — T148XXA Other injury of unspecified body region, initial encounter: Secondary | ICD-10-CM

## 2012-03-18 MED ORDER — HYDROCODONE-ACETAMINOPHEN 5-500 MG PO TABS: ORAL_TABLET | ORAL | Status: AC

## 2012-03-18 NOTE — Assessment & Plan Note (Signed)
rf hydrocodone prn. Continue mobic prn. Sternal xray neg. Followup if no improvement or worsening.

## 2012-03-18 NOTE — Assessment & Plan Note (Signed)
Schedule bone density.    

## 2012-03-18 NOTE — Progress Notes (Signed)
  Subjective:    Patient ID: [name] [name], female    DOB: 06/15/1919, 78 y.o.   MRN: [id number]  HPI Pt presents to clinic for followup of multiple medical problems. Suffered fall night before last and presented to ED. Notes sternal pain and mild lbp without radiation. Sternal and cxr without fx. ls xray suggests possible mild t12 compression fx. Last bmd unknown. Denies dyspnea or cough s/p 2 courses of abx.  Past Medical History  Diagnosis Date  . Arthritis   . History of chicken pox   . Depression     husband died 18-Sep-2011  . Glaucoma   . Thyroid disease   . Hypertension    Past Surgical History  Procedure Date  . Revision total knee arthroplasty     right knee  . Back surgery   . Cataract extraction, bilateral   . Thyroidectomy   . Abdominal hysterectomy     reports that she has never smoked. She has never used smokeless tobacco. She reports that she does not drink alcohol or use illicit drugs. family history is not on file. Allergies  Allergen Reactions  . Codeine Other (See Comments)    insomnia  . Doxycycline Diarrhea and Other (See Comments)    Hallucinations  . Fentanyl Hives and Rash  . Lisinopril Nausea And Vomiting, Rash and Hypertension      Review of Systems see hpi     Objective:   Physical Exam  Constitutional: She appears well-developed and well-nourished. No distress.  HENT:  Head: Normocephalic and atraumatic.  Musculoskeletal:       Mild tenderness along mid sternum without bony abn  Neurological: She is alert.  Skin: Skin is warm and dry. She is not diaphoretic.  Psychiatric: She has a normal mood and affect.          Assessment & Plan:

## 2012-03-21 ENCOUNTER — Ambulatory Visit (INDEPENDENT_AMBULATORY_CARE_PROVIDER_SITE_OTHER): Payer: No Typology Code available for payment source | Admitting: Psychology

## 2012-03-21 DIAGNOSIS — F4321 Adjustment disorder with depressed mood: Secondary | ICD-10-CM

## 2012-03-28 ENCOUNTER — Telehealth: Payer: Self-pay | Admitting: *Deleted

## 2012-03-28 ENCOUNTER — Ambulatory Visit: Payer: PRIVATE HEALTH INSURANCE | Admitting: Internal Medicine

## 2012-03-28 DIAGNOSIS — F418 Other specified anxiety disorders: Secondary | ICD-10-CM

## 2012-03-28 NOTE — Telephone Encounter (Signed)
She is taking medication. celexa 20mg  qd. i have discussed it with her daughter in law at length at each visit. pls confirm dosing of 20mg  qd. If correct offer two options 1) increase dose to 40mg  po qd or 2) psychiatrist referral

## 2012-03-28 NOTE — Telephone Encounter (Signed)
Patients daughter in law called and left voice message stating she visited with patient yesterday,and the patient appears to be more depressed. She stated in her message she does not think the patient is suicidal, however she feels she should be more upbeat than she is for her age. She is requesting a phone consultation to discuss the patients mood. She is wanting to know if patient should take some type of medication.

## 2012-03-31 MED ORDER — CITALOPRAM HYDROBROMIDE 40 MG PO TABS: 40.0000 mg | ORAL_TABLET | Freq: Every day | ORAL | Status: DC

## 2012-03-31 NOTE — Telephone Encounter (Signed)
Call placed to [facility] [phone], spoke with Bjorn Loser, she stated there was no one available in the med room to speak with me. A message was left with medication change and to have med nurse return phone call.

## 2012-03-31 NOTE — Telephone Encounter (Signed)
Call placed to Bev at  [phone], spoke with patients son Avair. He was advised per Dr Emilee Hero instructions. He has confirmed patient is taking the 20 mg of Celexa, and he would like to proceed with the psychiatric referral.

## 2012-03-31 NOTE — Telephone Encounter (Signed)
Rhonda from the [employer] returned phone call. Best # [phone]

## 2012-04-01 ENCOUNTER — Telehealth: Payer: Self-pay | Admitting: Internal Medicine

## 2012-04-01 NOTE — Telephone Encounter (Signed)
Patients daughter in law states that they would like a 2nd opinion to an orthopedic surgeon. They would like patient to be referred to Dr. Despina Hick at [facility].

## 2012-04-01 NOTE — Telephone Encounter (Signed)
Call placed to [facility] at [phone], Bjorn Loser stated med tech was not available. They are doing a med pass. She was informed I would check back.

## 2012-04-01 NOTE — Telephone Encounter (Signed)
Call placed to  Bev at [phone] patient is having severe right thigh/leg weakness. She stated she was unable to support her self today.  Bev stated patient was seen by Dr Madelon Lips , was not happy with the service. She  would like a referral to Dr Despina Hick.

## 2012-04-02 ENCOUNTER — Other Ambulatory Visit: Payer: Self-pay | Admitting: Internal Medicine

## 2012-04-02 ENCOUNTER — Telehealth: Payer: Self-pay | Admitting: *Deleted

## 2012-04-02 DIAGNOSIS — M25559 Pain in unspecified hip: Secondary | ICD-10-CM

## 2012-04-02 NOTE — Telephone Encounter (Signed)
Call returned to [facility] with [facility] at [phone], no answer. A detailed voice message was left informing her of verbal approval to extend Home Health Physical Therapy per Dr Rodena Medin ok.

## 2012-04-02 NOTE — Telephone Encounter (Signed)
ok 

## 2012-04-02 NOTE — Telephone Encounter (Signed)
Byrd Hesselbach - a physical therapist with [facility] called and left voice message requesting a verbal order to extend Home Health Physical Therapy for 2 x per week for the next 3 weeks to meet patient goals. Her message stated that the physical therapy has been delayed to due to multiple pain the patient has been experiencing.

## 2012-04-02 NOTE — Telephone Encounter (Signed)
Order faxed to [facility] at [phone].

## 2012-04-02 NOTE — Telephone Encounter (Signed)
Referral order placed.

## 2012-04-04 ENCOUNTER — Ambulatory Visit (INDEPENDENT_AMBULATORY_CARE_PROVIDER_SITE_OTHER): Payer: No Typology Code available for payment source | Admitting: Psychology

## 2012-04-04 DIAGNOSIS — F4321 Adjustment disorder with depressed mood: Secondary | ICD-10-CM

## 2012-04-07 ENCOUNTER — Ambulatory Visit
Admission: RE | Admit: 2012-04-07 | Discharge: 2012-04-07 | Disposition: A | Discharge status: Home / Self Care | Payer: Medicare Other | Source: Ambulatory Visit | Attending: Orthopedic Surgery | Admitting: Orthopedic Surgery

## 2012-04-07 ENCOUNTER — Other Ambulatory Visit: Payer: Self-pay | Admitting: Orthopedic Surgery

## 2012-04-07 DIAGNOSIS — M25551 Pain in right hip: Secondary | ICD-10-CM

## 2012-04-08 ENCOUNTER — Telehealth: Payer: Self-pay | Admitting: Internal Medicine

## 2012-04-08 NOTE — Telephone Encounter (Signed)
See phone note 4/19. Given options and son wished to proceed with psychiatrist referral

## 2012-04-08 NOTE — Telephone Encounter (Signed)
Patient's daughter states patient is very depression and want to know if Dr Rodena Medin is going to refer to Psychiatry she has seen Dr Dellia Cloud Psychologist  @ [facility] ,but thought he wanted her to Psychiatry to manage her medication, Please call Bev @ [phone]

## 2012-04-09 ENCOUNTER — Other Ambulatory Visit: Payer: Self-pay | Admitting: Internal Medicine

## 2012-04-09 DIAGNOSIS — F329 Major depressive disorder, single episode, unspecified: Secondary | ICD-10-CM

## 2012-04-09 NOTE — Telephone Encounter (Signed)
Psychiatry referral order placed

## 2012-04-09 NOTE — Telephone Encounter (Signed)
Patients son advised if he has not heard from office within one week regarding there referral he was advised to call back. Aviar verbalized understanding and agrees as instructed.

## 2012-04-09 NOTE — Telephone Encounter (Signed)
Call placed to patients son at [phone]. He has requested referral to psychiatry and not psychologist. He would like to proceed with the psychiatric referral as previously discussed.

## 2012-04-16 ENCOUNTER — Ambulatory Visit (INDEPENDENT_AMBULATORY_CARE_PROVIDER_SITE_OTHER): Payer: Medicare Other | Admitting: Psychiatry

## 2012-04-16 DIAGNOSIS — F329 Major depressive disorder, single episode, unspecified: Secondary | ICD-10-CM

## 2012-04-22 ENCOUNTER — Ambulatory Visit (INDEPENDENT_AMBULATORY_CARE_PROVIDER_SITE_OTHER): Payer: No Typology Code available for payment source | Admitting: Psychology

## 2012-04-22 DIAGNOSIS — F4321 Adjustment disorder with depressed mood: Secondary | ICD-10-CM

## 2012-04-22 NOTE — Progress Notes (Signed)
Psychiatric Assessment Adult  Patient Identification:  [name] [name] Date of Evaluation:  04/22/2012 Chief Complaint: Loss ability to enjoy life History of Chief Complaint:   Chief Complaint  Patient presents with  . Depression    HPI this patient is a 78 year old female who lives at [facility]. She moved from [place] to [place] in December 03, 2011. Her husband died 03-Oct-2023 before she moved. She did married to him for 65 years. She has 2 children who are involved in her care. It is very evident that this patient wants to live with her children who live in the [place] community. Her daughter-in-law , Meriam Sprague was in this appointment shared that they just cannot have her home because they cannot take care of her. This patient is very hearing-impaired. She admits to some sadness. She shows significant anhedonia in that she does nothing at that facility she is living in. There is some questions about her sleep she is eating only fairly well. Her family is very dedicated to her as her son comes to visit her on most every night and they take her out every weekend. Now they have arranged for her to have a companion a few hours almost every day. This patient denies any history of alcohol use. She is experiencing no psychosis. She does seem to have chronic anxiety and worries a great deal. She is very difficult to interview. There is no clear evidence that she is panic disorder or obsessive-compulsive disorder. Her primary care doctor just recently increased her Celexa to 40 mg just 2 weeks ago. She also takes a benzodiazepine during the day she recently has started in talking therapy with Dr. Caralyn Guile. There is no evidence of agitation in this patient and she makes no attempt to elope. She is not suicidal. Review of Systems Physical Exam  Depressive Symptoms: anhedonia,  (Hypo) Manic Symptoms:   Elevated Mood:  No Irritable Mood:  No Grandiosity:  No Distractibility:   No Labiality of Mood:  No Delusions:  No Hallucinations:  No Impulsivity:  No Sexually Inappropriate Behavior:  No Financial Extravagance:  No Flight of Ideas:  No  Anxiety Symptoms: Excessive Worry:  Yes Panic Symptoms:  No Agoraphobia:  No Obsessive Compulsive: No  Symptoms: None, Specific Phobias:  No Social Anxiety:  No  Psychotic Symptoms:  Hallucinations: No None Delusions:  No Paranoia:  No   Ideas of Reference:  No  PTSD Symptoms: Ever had a traumatic exposure:  No Had a traumatic exposure in the last month:  No Traumatic Brain Injury: No   Past Psychiatric History: Diagnosis:Major depression  Hospitalizations:none  Outpatient Care:   Substance Abuse Care:   Self-Mutilation: none  Suicidal Attempts:none  Violent Behaviors: none   Past Medical History:   Past Medical History  Diagnosis Date  . Arthritis   . History of chicken pox   . Depression     husband died 2011/10/03  . Glaucoma   . Thyroid disease   . Hypertension    History of Loss of Consciousness:  No Seizure History:  No Cardiac History:  Yes Allergies:   Allergies  Allergen Reactions  . Codeine Other (See Comments)    insomnia  . Doxycycline Diarrhea and Other (See Comments)    Hallucinations  . Fentanyl Hives and Rash  . Lisinopril Nausea And Vomiting, Rash and Hypertension   Current Medications:  Current Outpatient Prescriptions  Medication Sig Dispense Refill  . acetaminophen (TYLENOL) 325 MG tablet Take 650 mg by mouth every  6 (six) hours as needed. For pain      . atenolol (TENORMIN) 50 MG tablet Take 50 mg by mouth daily.        . bisacodyl (DULCOLAX) 5 MG EC tablet Take 5 mg by mouth daily.       . citalopram (CELEXA) 40 MG tablet Take 1 tablet (40 mg total) by mouth daily.  30 tablet  6  . ENSURE (ENSURE) Take 1 Can by mouth 3 (three) times daily between meals.      . fluticasone (FLONASE) 50 MCG/ACT nasal spray Place 2 sprays into the nose daily.      . furosemide (LASIX)  20 MG tablet Take 20 mg by mouth daily.        Marland Kitchen levothyroxine (SYNTHROID, LEVOTHROID) 88 MCG tablet Take 88 mcg by mouth daily.        Marland Kitchen LORazepam (ATIVAN) 0.5 MG tablet Take 0.5 mg in the am and 1 mg in the evening  45 tablet  3  . meloxicam (MOBIC) 7.5 MG tablet Take 7.5 mg by mouth daily. Take with food.      . Multiple Vitamins-Minerals (PRESERVISION AREDS 2 PO) Take 1 tablet by mouth daily.      Marland Kitchen rOPINIRole (REQUIP) 1 MG tablet Take 1 mg by mouth 2 (two) times daily. At 2 am and 2 pm      . vitamin B-12 (CYANOCOBALAMIN) 1000 MCG tablet Take 1,000 mcg by mouth daily.        Previous Psychotropic Medications:  Medication Dose                          Substance Abuse History in the last 12 months:none                                                                                                   Medical Consequences of Substance Abuse:   Legal Consequences of Substance Abuse:   Family Consequences of Substance Abuse:   Social History: Current Place of Residence: [place] Place of Birth:  Family Members:  Marital Status:  Widowed Children: 2  Sons: 1  Daughters: 1 Relationships: Education:        Family History:  No family history on file.  Mental Status Examination/Evaluation: Objective:  Appearance: Neat  Eye Contact::  Fair  Speech:  Slow  Volume:  Normal  Mood:  Sad  Affect:  Blunt  Thought Process:  Linear  Orientation:  Full  Thought Content:  WDL and Hallucinations: None  Suicidal Thoughts:  No  Homicidal Thoughts:  No  Judgement:  Fair  Insight:  Fair  Psychomotor Activity:  Psychomotor Retardation            Laboratory/X-Ray Psychological Evaluation(s)        Assessment:  Axis I: Major Depression, single episode  AXIS I Major Depression, Recurrent severe  AXIS II No diagnosis  AXIS III Past Medical History  Diagnosis Date  . Arthritis   . History of chicken pox   . Depression     husband died September 28, 2011  .  Glaucoma   . Thyroid disease   . Hypertension      AXIS IV problems related to social environment  AXIS V 61-70 mild symptoms   Treatment Plan/Recommendations:  Plan of Care: At this time we she'll continue to observe her and continue the Celexa that she is on. We should give it at least 4 more weeks to see its effects. She she'll return to see me in approximately 6 weeks. She she'll continue seeing Dr. Dellia Cloud in therapy.     Psychotherapy: none  Medications: Celexa 40  Routine PRN Medications:  No          Doak Mah, [place], MD 5/14/201310:05 PM

## 2012-04-23 ENCOUNTER — Encounter (HOSPITAL_COMMUNITY): Payer: Self-pay | Admitting: Psychiatry

## 2012-04-23 ENCOUNTER — Telehealth: Payer: Self-pay | Admitting: *Deleted

## 2012-04-23 ENCOUNTER — Ambulatory Visit (INDEPENDENT_AMBULATORY_CARE_PROVIDER_SITE_OTHER)
Admission: RE | Admit: 2012-04-23 | Discharge: 2012-04-23 | Disposition: A | Discharge status: Home / Self Care | Payer: Medicare Other | Source: Ambulatory Visit | Attending: Internal Medicine | Admitting: Internal Medicine

## 2012-04-23 DIAGNOSIS — IMO0002 Reserved for concepts with insufficient information to code with codable children: Secondary | ICD-10-CM

## 2012-04-23 DIAGNOSIS — T148XXA Other injury of unspecified body region, initial encounter: Secondary | ICD-10-CM

## 2012-04-23 NOTE — Telephone Encounter (Signed)
Patients daughter in law called  Stating patient was seen by the psychiatrist,and stated this was not their request. Bev was informed that patients son (her husband) presented with patient to office visit, and this was discussed with him. At the time of the office visit he informed Dr Rodena Medin that he would like to proceed with the referral for psychiatry, and prior to the referral being made,clarification was obtained. Bev was informed the referral to psychiatry was placed at the request of the son. She stated that she was not getting any answers, and Franky Macho could not inform her where the request was made. She is concerned the patient is seeing both psychology and psychiatry, and after the initial visit with the psychiatrist, they were not pleased with the visit. She stated there was no increase in dosing, and the time spent with the patient  (20 minutues) was not sufficient to make a assessment of the patient. Bev stated that she cancelled the patients follow up appointment with psychiatry and stated the patient can not continue to with both psychiatry and psychology. She was asked for clarification of what was needed by Dr Rodena Medin. She stated that she wanted to make him aware of what was going on with the patient. She was referred back to the psychiatrist to have her concerns addressed regarding medication adjustment and concerns with assessment of patient. Bev has verbalized understanding and agrees to follow up with psychiatrist. Her concerns and clarification was provided to her satisfaction. No further action required.

## 2012-04-29 ENCOUNTER — Telehealth: Payer: Self-pay | Admitting: *Deleted

## 2012-04-29 NOTE — Telephone Encounter (Signed)
ok 

## 2012-04-29 NOTE — Telephone Encounter (Signed)
Fax received from the [facility] requesting a hard copy for Lorazepam 0.5 mg take one tablet in the morning and 2 tablets in the evening faxed to [phone]

## 2012-04-30 MED ORDER — LORAZEPAM 0.5 MG PO TABS: ORAL_TABLET | ORAL | Status: DC

## 2012-04-30 NOTE — Telephone Encounter (Signed)
Rx signed and faxed.

## 2012-04-30 NOTE — Telephone Encounter (Signed)
Rx printed awaiting provider signature

## 2012-05-01 ENCOUNTER — Ambulatory Visit (INDEPENDENT_AMBULATORY_CARE_PROVIDER_SITE_OTHER): Payer: No Typology Code available for payment source | Admitting: Ophthalmology

## 2012-05-01 DIAGNOSIS — H43819 Vitreous degeneration, unspecified eye: Secondary | ICD-10-CM

## 2012-05-01 DIAGNOSIS — H3530 Unspecified macular degeneration: Secondary | ICD-10-CM

## 2012-05-02 ENCOUNTER — Encounter: Payer: Self-pay | Admitting: Internal Medicine

## 2012-05-02 ENCOUNTER — Ambulatory Visit (INDEPENDENT_AMBULATORY_CARE_PROVIDER_SITE_OTHER): Payer: Medicare Other | Admitting: Internal Medicine

## 2012-05-02 VITALS — BP 112/64 | HR 60 | Temp 97.7°F | Resp 16 | Wt 120.0 lb

## 2012-05-02 DIAGNOSIS — M542 Cervicalgia: Secondary | ICD-10-CM

## 2012-05-02 DIAGNOSIS — K2970 Gastritis, unspecified, without bleeding: Secondary | ICD-10-CM

## 2012-05-02 DIAGNOSIS — R252 Cramp and spasm: Secondary | ICD-10-CM

## 2012-05-02 DIAGNOSIS — Z79899 Other long term (current) drug therapy: Secondary | ICD-10-CM

## 2012-05-02 DIAGNOSIS — M810 Age-related osteoporosis without current pathological fracture: Secondary | ICD-10-CM

## 2012-05-02 DIAGNOSIS — K2990 Gastroduodenitis, unspecified, without bleeding: Secondary | ICD-10-CM

## 2012-05-02 MED ORDER — LIDOCAINE 5 % EX PTCH: MEDICATED_PATCH | CUTANEOUS | Status: DC

## 2012-05-02 MED ORDER — OMEPRAZOLE 40 MG PO CPDR: 40.0000 mg | DELAYED_RELEASE_CAPSULE | Freq: Every day | ORAL | Status: DC

## 2012-05-02 MED ORDER — HYDROCODONE-ACETAMINOPHEN 5-500 MG PO TABS: 1.0000 | ORAL_TABLET | Freq: Four times a day (QID) | ORAL | Status: DC | PRN

## 2012-05-02 NOTE — Patient Instructions (Signed)
Discontinue Mobic  Keep schedule appointment for July

## 2012-05-03 LAB — BASIC METABOLIC PANEL
CO2: 28 mEq/L (ref 19–32)
Calcium: 9.2 mg/dL (ref 8.4–10.5)
Creat: 1.1 mg/dL (ref 0.50–1.10)
Glucose, Bld: 85 mg/dL (ref 70–99)

## 2012-05-07 ENCOUNTER — Ambulatory Visit (HOSPITAL_COMMUNITY): Payer: Medicare Other | Admitting: Psychiatry

## 2012-05-09 ENCOUNTER — Ambulatory Visit (INDEPENDENT_AMBULATORY_CARE_PROVIDER_SITE_OTHER): Payer: No Typology Code available for payment source | Admitting: Psychology

## 2012-05-09 DIAGNOSIS — F4321 Adjustment disorder with depressed mood: Secondary | ICD-10-CM

## 2012-05-10 DIAGNOSIS — M810 Age-related osteoporosis without current pathological fracture: Secondary | ICD-10-CM | POA: Insufficient documentation

## 2012-05-10 DIAGNOSIS — R252 Cramp and spasm: Secondary | ICD-10-CM | POA: Insufficient documentation

## 2012-05-10 DIAGNOSIS — K2970 Gastritis, unspecified, without bleeding: Secondary | ICD-10-CM | POA: Insufficient documentation

## 2012-05-10 NOTE — Assessment & Plan Note (Signed)
Stop nsaids. Begin daily ppi. Followup if no improvement or worsening.

## 2012-05-10 NOTE — Assessment & Plan Note (Signed)
Discussion held with pt and family. Given current gi issues concerned over beginning fosamax. Re-evaluate at future visit

## 2012-05-10 NOTE — Assessment & Plan Note (Signed)
Attempt lidoderm patches.

## 2012-05-10 NOTE — Assessment & Plan Note (Signed)
Obtain chem7, mg. Consider mg ox qd

## 2012-05-10 NOTE — Progress Notes (Signed)
  Subjective:    Patient ID: [name] [name], female    DOB: 1919-01-31, 78 y.o.   MRN: [id number]  HPI Pt presents to clinic for followup of multiple medical problems. Notes chronic posterior neck pain and stiffness. Taking mobic and vicodin prn.c/o intermittent leg muscle cramps. Has recent periumbilical burning without radiation. Reviewed BMD with -3.1 T score. No recent fx.   Past Medical History  Diagnosis Date  . Arthritis   . History of chicken pox   . Depression     husband died 2011/10/05  . Glaucoma   . Thyroid disease   . Hypertension    Past Surgical History  Procedure Date  . Revision total knee arthroplasty     right knee  . Back surgery   . Cataract extraction, bilateral   . Thyroidectomy   . Abdominal hysterectomy     reports that she has never smoked. She has never used smokeless tobacco. She reports that she does not drink alcohol or use illicit drugs. family history is not on file. Allergies  Allergen Reactions  . Codeine Other (See Comments)    insomnia  . Doxycycline Diarrhea and Other (See Comments)    Hallucinations  . Fentanyl Hives and Rash  . Lisinopril Nausea And Vomiting, Rash and Hypertension      Review of Systems see hpi     Objective:   Physical Exam  Nursing note and vitals reviewed. Constitutional: She appears well-developed and well-nourished. No distress.  HENT:  Head: Normocephalic and atraumatic.  Eyes: Conjunctivae are normal. No scleral icterus.  Neck: Neck supple.  Abdominal: Soft. She exhibits no distension and no mass. There is no tenderness. There is no rebound and no guarding.  Skin: Skin is warm and dry. She is not diaphoretic.          Assessment & Plan:

## 2012-05-12 ENCOUNTER — Telehealth: Payer: Self-pay | Admitting: Internal Medicine

## 2012-05-12 NOTE — Telephone Encounter (Signed)
Actually i just need dr. Dawayne Cirri recommendation

## 2012-05-12 NOTE — Telephone Encounter (Signed)
[name] [name] called stating that patients son and her want to come in w/o patient to have a consultation with Dr. Rodena Medin regarding patients depression medication. Patients psychologist, Dr. Dellia Cloud states that patients depression medciation needs to be changed?? Please assist.

## 2012-05-12 NOTE — Telephone Encounter (Signed)
Spoke w/Jennifer at [facility] and relayed Dr. Rodena Medin request; will relay message to Dr. Dellia Cloud and place call back regarding this matter/SLS [facility] for son & daughter-in-law that per Dr. Rodena Medin, we will obtain Dr. Dawayne Cirri recommendations for patient's depression medication [which have been requested] and someone will give them a call back with Dr Ilda Foil response regarding Pt's medication and any other healthcare instructions/SLS

## 2012-05-15 ENCOUNTER — Other Ambulatory Visit: Payer: Self-pay | Admitting: Internal Medicine

## 2012-05-15 MED ORDER — LORAZEPAM 0.5 MG PO TABS: ORAL_TABLET | ORAL | Status: DC

## 2012-05-15 NOTE — Telephone Encounter (Signed)
Faxed received from [facility] requesting a Rx for Lorazepam 0.5 mg  Faxed to [place] at [phone].   Rx printed and forwarded to provider for signature.  Rx signed and faxed

## 2012-05-23 NOTE — Telephone Encounter (Signed)
Patients daughter in law called again stating that patient is very depressed and would like a return phone call on this. I told Bev that apparently we haven't heard from Dr. Dawayne Cirri office yet and she said that she would call Dr. Dawayne Cirri office herself.

## 2012-05-28 MED ORDER — SERTRALINE HCL 50 MG PO TABS: 50.0000 mg | ORAL_TABLET | Freq: Every day | ORAL | Status: DC

## 2012-05-28 NOTE — Telephone Encounter (Signed)
Call placed to Bev at [phone], she was informed Rx was changed to Zoloft 50 mg. She was informed per Dr Rodena Medin instructions to allow 3 week to determine if medication is working for the patient. She has verbalized understanding and agrees as instructed. Call placed to [facility] at [phone], spoke with Bjorn Loser, staff unavailable. A message was left for a return call regarding medication change.

## 2012-05-28 NOTE — Telephone Encounter (Signed)
Call placed to patients daughter Truett Mainland at [phone], no answer. A voice message was left informing Dr Rodena Medin has spoke with Dr Dellia Cloud, and had decided to change patient from Celexa 40 mg  to Lexapro 20 mg. A voice message was left informing Rx would be sent to pharmacy. ( Fax [phone])

## 2012-05-29 NOTE — Telephone Encounter (Signed)
Call placed to [facility] at [phone], Bjorn Loser stated Med tech was not available, she was passing meds, message was left with Bjorn Loser for a return phone call regarding medication changes for patient.

## 2012-05-29 NOTE — Telephone Encounter (Signed)
Mia with [facility] returned phone call. She was informed of medication change. Mia stated the medication was received by the facility. No further action required.

## 2012-05-30 ENCOUNTER — Telehealth: Payer: Self-pay | Admitting: *Deleted

## 2012-05-30 MED ORDER — HYDROCODONE-ACETAMINOPHEN 5-500 MG PO TABS: 1.0000 | ORAL_TABLET | Freq: Four times a day (QID) | ORAL | Status: DC | PRN

## 2012-05-30 NOTE — Telephone Encounter (Signed)
Fax received from [facility] requesting Hard copy refill to pharmacy for Hydroco/APAP 5-500 1 bid, and Ativan  0.5 mg tab in am and 2 tablets in the evening.  Call placed to [facility] at [phone], spoke with Mia, she was informed a refill for the ativan was sent on 05/15/2012 to [facility], however the hydrocodone will be submitted pending provider approval.

## 2012-05-30 NOTE — Telephone Encounter (Signed)
Rx printed and faxed to [facility] at [phone].

## 2012-05-30 NOTE — Telephone Encounter (Signed)
ok 

## 2012-06-09 ENCOUNTER — Telehealth: Payer: Self-pay | Admitting: *Deleted

## 2012-06-09 NOTE — Telephone Encounter (Signed)
Have spoken with them i believe in the past about the requip dosing. This is not the maximum dose for the medication but is higher than the recommended dose for RLS. Looking at the dosing schedule wonder if the 2am dose is really necessary (are they really waking her up for the med pass?). Could add the 2am 1mg  dose to the 8pm dose so --2mg  in am, 1mg  midday and 3mg  in evening.

## 2012-06-09 NOTE — Telephone Encounter (Signed)
Caller reports patient having 'breakthrough pain' with RLS and is taking Requip [2 mg 8:00am; 1 mg 2:00pm; 2 mg 8:00pm; 1 mg 2:00am]; requesting advise on what pt can do to help with this issue/SLS Please advise.

## 2012-06-10 NOTE — Telephone Encounter (Signed)
Patient's son & daughter in law given MD instructions/advice on dosage change [stop 2am & add that (1 mg) to 8pm dose]; agreed & understood, will call if any further issues/SLS

## 2012-06-16 ENCOUNTER — Telehealth: Payer: Self-pay | Admitting: *Deleted

## 2012-06-16 NOTE — Telephone Encounter (Signed)
Pt c/o cramping and pain in neck and toes. Pt states that it is possible related to arthritis and would like to know if a possible muscle relaxant can be Rx. Pt has pend OV for tomorrow but would like to have something else to help with pain and cramping. Pt is taking pain med for pain.Please advise

## 2012-06-16 NOTE — Telephone Encounter (Signed)
Discuss with patient daughter in law- Bev

## 2012-06-16 NOTE — Telephone Encounter (Signed)
Usually help more for the big muscles like back muscles. Worry that it may make her sleepy and prone to falls. Recommend discuss at visit

## 2012-06-17 ENCOUNTER — Ambulatory Visit (INDEPENDENT_AMBULATORY_CARE_PROVIDER_SITE_OTHER): Payer: Medicare Other | Admitting: Internal Medicine

## 2012-06-17 VITALS — BP 110/76 | HR 62 | Temp 98.1°F | Wt 115.0 lb

## 2012-06-17 DIAGNOSIS — F341 Dysthymic disorder: Secondary | ICD-10-CM

## 2012-06-17 DIAGNOSIS — K5900 Constipation, unspecified: Secondary | ICD-10-CM

## 2012-06-17 DIAGNOSIS — R252 Cramp and spasm: Secondary | ICD-10-CM

## 2012-06-17 DIAGNOSIS — G2581 Restless legs syndrome: Secondary | ICD-10-CM

## 2012-06-17 DIAGNOSIS — F418 Other specified anxiety disorders: Secondary | ICD-10-CM

## 2012-06-17 MED ORDER — SERTRALINE HCL 100 MG PO TABS: 100.0000 mg | ORAL_TABLET | Freq: Every day | ORAL | Status: DC

## 2012-06-17 MED ORDER — HYDROCODONE-ACETAMINOPHEN 5-500 MG PO TABS: 1.0000 | ORAL_TABLET | Freq: Four times a day (QID) | ORAL | Status: DC | PRN

## 2012-06-17 MED ORDER — MAGNESIUM OXIDE 400 MG PO TABS: 400.0000 mg | ORAL_TABLET | Freq: Every day | ORAL | Status: AC

## 2012-06-17 MED ORDER — GABAPENTIN 300 MG PO CAPS: 300.0000 mg | ORAL_CAPSULE | Freq: Every day | ORAL | Status: DC

## 2012-06-21 ENCOUNTER — Encounter: Payer: Self-pay | Admitting: Internal Medicine

## 2012-06-21 DIAGNOSIS — K5900 Constipation, unspecified: Secondary | ICD-10-CM | POA: Insufficient documentation

## 2012-06-21 NOTE — Assessment & Plan Note (Signed)
Increase zoloft

## 2012-06-21 NOTE — Progress Notes (Signed)
  Subjective:    Patient ID: [name] [name], female    DOB: Jun 14, 1919, 78 y.o.   MRN: [id number]  HPI Pt presents to clinic for followup of multiple medical problems.  Notes chronic intermittent toe cramps. Has posterior neck pain that radiates upward to scalp. Has continued depression tolerating zoloft. +chronic constipation with narcotic use. Increased c/o RLS sx's despite high dose requip. Total time of visit ~28 minns of which >50% of time spent in counseling.  Past Medical History  Diagnosis Date  . Arthritis   . History of chicken pox   . Depression     husband died Sep 10, 2011  . Glaucoma   . Thyroid disease   . Hypertension    Past Surgical History  Procedure Date  . Revision total knee arthroplasty     right knee  . Back surgery   . Cataract extraction, bilateral   . Thyroidectomy   . Abdominal hysterectomy     reports that she has never smoked. She has never used smokeless tobacco. She reports that she does not drink alcohol or use illicit drugs. family history is not on file. Allergies  Allergen Reactions  . Codeine Other (See Comments)    insomnia  . Doxycycline Diarrhea and Other (See Comments)    Hallucinations  . Fentanyl Hives and Rash  . Lisinopril Nausea And Vomiting, Rash and Hypertension      Review of Systems see hpi     Objective:   Physical Exam  Nursing note and vitals reviewed. Constitutional: She appears well-developed and well-nourished. No distress.  HENT:  Head: Normocephalic and atraumatic.  Right Ear: External ear normal.  Left Ear: External ear normal.  Neurological: She is alert.  Skin: She is not diaphoretic.  Psychiatric: She has a normal mood and affect.          Assessment & Plan:

## 2012-06-21 NOTE — Assessment & Plan Note (Signed)
Begin colace daily

## 2012-06-21 NOTE — Assessment & Plan Note (Signed)
Order written for mag ox qd.

## 2012-06-21 NOTE — Assessment & Plan Note (Signed)
Add neurontin qhs- discussed potential for sedation/unsteadiness. To be monitored closely

## 2012-06-24 ENCOUNTER — Ambulatory Visit: Payer: Medicare Other | Admitting: Internal Medicine

## 2012-06-27 ENCOUNTER — Ambulatory Visit (INDEPENDENT_AMBULATORY_CARE_PROVIDER_SITE_OTHER): Payer: PRIVATE HEALTH INSURANCE | Admitting: Ophthalmology

## 2012-07-03 ENCOUNTER — Telehealth: Payer: Self-pay | Admitting: *Deleted

## 2012-07-03 NOTE — Telephone Encounter (Signed)
Received call from [place] (pt's daughter in law) stating pt is telling them that she is waking up in the middle of the night with shaking in her legs. Does not feel the neurontin is helping. Family is requesting that pt be put back on 2am dose of Requip. Reminded Meriam Sprague that pt was still having break through symptoms in the early morning hours when pt was supposed to be getting the 2am dose. Family would like to know what else can be done to help with pt's symptoms?  Please advise.

## 2012-07-04 MED ORDER — ROPINIROLE HCL 1 MG PO TABS: ORAL_TABLET | ORAL | Status: DC

## 2012-07-04 MED ORDER — GABAPENTIN 300 MG PO CAPS: 600.0000 mg | ORAL_CAPSULE | Freq: Every day | ORAL | Status: DC

## 2012-07-04 NOTE — Telephone Encounter (Signed)
Can resume old dosing of requip. Should be in med hx. Can increase neurontin to 600mg  po qhs. Do not recommend high dosages for this pt.

## 2012-07-04 NOTE — Telephone Encounter (Signed)
New Rx[s] done, printed & faxed to [facility] at [phone]; patient's son, [name] [name], informed/SLS

## 2012-08-04 ENCOUNTER — Encounter (HOSPITAL_COMMUNITY): Payer: Self-pay

## 2012-08-04 ENCOUNTER — Emergency Department (HOSPITAL_COMMUNITY)
Admission: EM | Admit: 2012-08-04 | Discharge: 2012-08-04 | Disposition: A | Discharge status: Home / Self Care | Payer: Medicare Other | Source: Home / Self Care | Attending: Emergency Medicine | Admitting: Emergency Medicine

## 2012-08-04 ENCOUNTER — Ambulatory Visit: Payer: Self-pay | Admitting: Internal Medicine

## 2012-08-04 ENCOUNTER — Emergency Department (HOSPITAL_COMMUNITY): Payer: Medicare Other

## 2012-08-04 DIAGNOSIS — Z79899 Other long term (current) drug therapy: Secondary | ICD-10-CM | POA: Insufficient documentation

## 2012-08-04 DIAGNOSIS — I10 Essential (primary) hypertension: Secondary | ICD-10-CM | POA: Insufficient documentation

## 2012-08-04 DIAGNOSIS — W07XXXA Fall from chair, initial encounter: Secondary | ICD-10-CM | POA: Insufficient documentation

## 2012-08-04 DIAGNOSIS — G319 Degenerative disease of nervous system, unspecified: Secondary | ICD-10-CM | POA: Insufficient documentation

## 2012-08-04 DIAGNOSIS — S0003XA Contusion of scalp, initial encounter: Secondary | ICD-10-CM | POA: Insufficient documentation

## 2012-08-04 DIAGNOSIS — F3289 Other specified depressive episodes: Secondary | ICD-10-CM | POA: Insufficient documentation

## 2012-08-04 DIAGNOSIS — Z8739 Personal history of other diseases of the musculoskeletal system and connective tissue: Secondary | ICD-10-CM | POA: Insufficient documentation

## 2012-08-04 DIAGNOSIS — W19XXXA Unspecified fall, initial encounter: Secondary | ICD-10-CM

## 2012-08-04 DIAGNOSIS — S0093XA Contusion of unspecified part of head, initial encounter: Secondary | ICD-10-CM

## 2012-08-04 DIAGNOSIS — S1093XA Contusion of unspecified part of neck, initial encounter: Secondary | ICD-10-CM | POA: Insufficient documentation

## 2012-08-04 DIAGNOSIS — E079 Disorder of thyroid, unspecified: Secondary | ICD-10-CM | POA: Insufficient documentation

## 2012-08-04 DIAGNOSIS — I517 Cardiomegaly: Secondary | ICD-10-CM | POA: Insufficient documentation

## 2012-08-04 DIAGNOSIS — F329 Major depressive disorder, single episode, unspecified: Secondary | ICD-10-CM | POA: Insufficient documentation

## 2012-08-04 HISTORY — DX: Hypokalemia: E87.6

## 2012-08-04 HISTORY — DX: Disorder of kidney and ureter, unspecified: N28.9

## 2012-08-04 HISTORY — DX: Altered mental status, unspecified: R41.82

## 2012-08-04 NOTE — ED Notes (Signed)
Per ems- Pt from [facility], staff found pt lying on carpeted floor, no injury noted, pt denies injury. Unknown why pt on floor. No bruising, deformity, swelling noted. No neuro deficits noted. Pt stated to ems that she "slid" out of her wheelchair. Pt a&ox4 currently. Pt denies any complaints at this time. Denies LOC.

## 2012-08-04 NOTE — ED Notes (Signed)
Patient transported from CT 

## 2012-08-04 NOTE — ED Notes (Signed)
NO PIV

## 2012-08-04 NOTE — ED Notes (Signed)
Patient discharged with her son and daughter-in-law.  Patient escorted out by [facility] and family in wheelchair.  No PIV upon arrival.

## 2012-08-04 NOTE — ED Provider Notes (Cosign Needed)
History     CSN: [id number]  Arrival date & time 08/04/12  1238   First MD Initiated Contact with Patient 08/04/12 1301      Chief Complaint  Patient presents with  . Fall    (Consider location/radiation/quality/duration/timing/severity/associated sxs/prior treatment) Patient is a 78 y.o. female presenting with fall. The history is provided by the patient and a relative (pt fell out of her chair and hit her head.  no loc.  pt  has no pain). No language interpreter was used.  Fall The accident occurred 3 to 5 hours ago. Incident: sitting in chair. She fell from a height of 1 to 2 ft. She landed on carpet. The point of impact was the head. The pain is present in the head. The pain is at a severity of 0/10. The patient is experiencing no pain. She was not ambulatory at the scene. There was entrapment after the fall. There was no drug use involved in the accident. There was no alcohol use involved in the accident. Pertinent negatives include no abdominal pain, no vomiting, no hematuria and no headaches. Exacerbated by: nothing. She has tried nothing for the symptoms. The treatment provided no relief.    Past Medical History  Diagnosis Date  . Arthritis   . History of chicken pox   . Depression     husband died 09-26-11  . Glaucoma   . Thyroid disease   . Hypertension   . Hypokalemia   . Renal disorder   . Altered mental state     Past Surgical History  Procedure Date  . Revision total knee arthroplasty     right knee  . Back surgery   . Cataract extraction, bilateral   . Thyroidectomy   . Abdominal hysterectomy     No family history on file.  History  Substance Use Topics  . Smoking status: Never Smoker   . Smokeless tobacco: Never Used  . Alcohol Use: No    OB History    Grav Para Term Preterm Abortions TAB SAB Ect Mult Living                  Review of Systems  Constitutional: Negative for fatigue.  HENT: Negative for congestion, sinus pressure and ear  discharge.   Eyes: Negative for discharge.  Respiratory: Negative for cough.   Cardiovascular: Negative for chest pain.  Gastrointestinal: Negative for vomiting, abdominal pain and diarrhea.  Genitourinary: Negative for frequency and hematuria.  Musculoskeletal: Negative for back pain.  Skin: Negative for rash.  Neurological: Negative for seizures and headaches.  Hematological: Negative.   Psychiatric/Behavioral: Negative for hallucinations.    Allergies  Codeine; Doxycycline; Fentanyl; and Lisinopril  Home Medications   Current Outpatient Rx  Name Route Sig Dispense Refill  . ATENOLOL 50 MG PO TABS Oral Take 50 mg by mouth daily.      Marland Kitchen BISACODYL 5 MG PO TBEC Oral Take 5 mg by mouth daily.     Marland Kitchen ENSURE PO LIQD Oral Take 1 Can by mouth 3 (three) times daily between meals. Ensure butter/pecan liquid    . FLUTICASONE PROPIONATE 50 MCG/ACT NA SUSP Nasal Place 2 sprays into the nose daily.    . FUROSEMIDE 20 MG PO TABS Oral Take 20 mg by mouth daily.      Marland Kitchen GABAPENTIN 300 MG PO CAPS Oral Take 2 capsules (600 mg total) by mouth at bedtime. 60 capsule 3  . HYDROCODONE-ACETAMINOPHEN 5-500 MG PO TABS Oral Take 1 tablet by  mouth 2 (two) times daily.     Marland Kitchen LEVOTHYROXINE SODIUM 88 MCG PO TABS Oral Take 88 mcg by mouth daily.     Marland Kitchen LIDOCAINE 5 % EX PTCH  Take on patch and cut to  1/4  and apply to patients neck in am as needed for pain remove 12 hours later 60 patch 1  . LORAZEPAM 0.5 MG PO TABS Oral Take 0.5-1 mg by mouth 2 (two) times daily. 1 tablet in the morning 2 tablets in the evening    . MAGNESIUM OXIDE 400 MG PO TABS Oral Take 1 tablet (400 mg total) by mouth daily. 30 tablet 11  . MELOXICAM 15 MG PO TABS Oral Take 7.5 mg by mouth every morning.     Marland Kitchen PRESERVISION AREDS 2 PO Oral Take 1 tablet by mouth daily.    Marland Kitchen OMEPRAZOLE 40 MG PO CPDR Oral Take 1 capsule (40 mg total) by mouth daily. 30 capsule 3  . ROPINIROLE HCL 1 MG PO TABS Oral Take 1-2 mg by mouth 4 (four) times daily. 1mg   at 2AM 2mg  at 8AM 1mg  at 2PM 2mg  at 8PM    . SERTRALINE HCL 100 MG PO TABS Oral Take 1 tablet (100 mg total) by mouth daily. 30 tablet 6  . VITAMIN B-12 1000 MCG PO TABS Oral Take 1,000 mcg by mouth daily.      BP 154/68  Pulse 62  Temp 97.1 F (36.2 C) (Oral)  Resp 16  SpO2 97%  Physical Exam  Constitutional: She is oriented to person, place, and time. She appears well-developed.  HENT:  Head: Normocephalic and atraumatic.  Eyes: Conjunctivae and EOM are normal. No scleral icterus.  Neck: No thyromegaly present.       Pt has kyphosis in neck.    Cardiovascular: Normal rate and regular rhythm.  Exam reveals no gallop and no friction rub.   No murmur heard. Pulmonary/Chest: No stridor. She has no wheezes. She has no rales. She exhibits no tenderness.  Abdominal: She exhibits no distension. There is no tenderness. There is no rebound.  Musculoskeletal: Normal range of motion. She exhibits no edema.  Lymphadenopathy:    She has no cervical adenopathy.  Neurological: She is oriented to person, place, and time. Coordination normal.  Skin: No rash noted. No erythema.  Psychiatric: She has a normal mood and affect. Her behavior is normal.    ED Course  Procedures (including critical care time)  Labs Reviewed - No data to display Ct Head Wo Contrast  08/04/2012  *RADIOLOGY REPORT*  Clinical Data:  Fall from chair, striking her head.  CT HEAD WITHOUT CONTRAST CT CERVICAL SPINE WITHOUT CONTRAST  Technique:  Multidetector CT imaging of the head and cervical spine was performed following the standard protocol without intravenous contrast.  Multiplanar CT image reconstructions of the cervical spine were also generated.  Comparison:  CT head and cervical spine without contrast 01/17/2012.  CT HEAD  Findings: Mild atrophy and periventricular white matter hypoattenuation is similar to the prior exam.  No acute cortical infarct, hemorrhage, or mass lesion is present.  Vascular calcifications  are again noted within the cavernous carotid arteries bilaterally.  The visualized paranasal sinuses and mastoid air cells are clear. The osseous skull is intact.  IMPRESSION:  1.  No acute intracranial abnormality or significant interval change. 2.  Stable atrophy and diffuse white matter disease.  CT CERVICAL SPINE  Findings: Cervical spine is imaged from skull base through T9. Exaggerated cervical kyphosis is  more pronounced than on the prior exam.  The vertebral body heights and alignment are maintained. Mild endplate degenerative changes are again seen.  Endplate osteophyte formation is most pronounced at C4-5, as before.  The soft tissues are unremarkable.  The lung apices demonstrate mild dependent atelectasis bilaterally.  Coronary artery calcifications are present.  The heart is enlarged.  IMPRESSION:  1.  Exaggerated cervical thoracic kyphosis. 2.  No acute abnormality. 3.  Stable degenerative changes. 4.  Cardiomegaly with evidence for coronary artery disease.   Original Report Authenticated By: Jamesetta Orleans. MATTERN, M.D.    Ct Cervical Spine Wo Contrast  08/04/2012  *RADIOLOGY REPORT*  Clinical Data:  Fall from chair, striking her head.  CT HEAD WITHOUT CONTRAST CT CERVICAL SPINE WITHOUT CONTRAST  Technique:  Multidetector CT imaging of the head and cervical spine was performed following the standard protocol without intravenous contrast.  Multiplanar CT image reconstructions of the cervical spine were also generated.  Comparison:  CT head and cervical spine without contrast 01/17/2012.  CT HEAD  Findings: Mild atrophy and periventricular white matter hypoattenuation is similar to the prior exam.  No acute cortical infarct, hemorrhage, or mass lesion is present.  Vascular calcifications are again noted within the cavernous carotid arteries bilaterally.  The visualized paranasal sinuses and mastoid air cells are clear. The osseous skull is intact.  IMPRESSION:  1.  No acute intracranial abnormality  or significant interval change. 2.  Stable atrophy and diffuse white matter disease.  CT CERVICAL SPINE  Findings: Cervical spine is imaged from skull base through T9. Exaggerated cervical kyphosis is more pronounced than on the prior exam.  The vertebral body heights and alignment are maintained. Mild endplate degenerative changes are again seen.  Endplate osteophyte formation is most pronounced at C4-5, as before.  The soft tissues are unremarkable.  The lung apices demonstrate mild dependent atelectasis bilaterally.  Coronary artery calcifications are present.  The heart is enlarged.  IMPRESSION:  1.  Exaggerated cervical thoracic kyphosis. 2.  No acute abnormality. 3.  Stable degenerative changes. 4.  Cardiomegaly with evidence for coronary artery disease.   Original Report Authenticated By: Jamesetta Orleans. MATTERN, M.D.      1. Fall   2. Head contusion       MDM          Benny Lennert, MD 08/04/12 1501

## 2012-08-04 NOTE — ED Notes (Signed)
Patient brought by EMS from the [facility] assisted living.  Patient fell from a chair onto the floor.  Patient endorses hitting her head during the fall but denies any pain.  Patient is alert and oriented.

## 2012-08-05 ENCOUNTER — Telehealth: Payer: Self-pay | Admitting: Internal Medicine

## 2012-08-05 MED ORDER — LACTULOSE 10 GM/15ML PO SOLN: ORAL | Status: DC

## 2012-08-05 MED ORDER — GABAPENTIN 300 MG PO CAPS: 300.0000 mg | ORAL_CAPSULE | Freq: Every day | ORAL | Status: DC

## 2012-08-05 NOTE — Telephone Encounter (Signed)
The last medication that was increased was neurontin. That can cause sedation. Can go ahead and decrease to 300mg  from 600mg . Ok for laxative to be bid prn

## 2012-08-05 NOTE — Telephone Encounter (Signed)
Patients daughter in law, Bev called stating that patient fell yesterday and was taken to [facility] ED. I made a post ED visit for 08/15/12. Bev states that she thinks zoloft needs to be decreased because patient has been very lethargic recently.

## 2012-08-05 NOTE — Telephone Encounter (Signed)
Patient's daughter-in-law informed, new laxative Rx to pharmacy as well as change in Neurontin dosage to 300 mg at bedtime [decreased from 600 mg; scheduled OV for Fri, 08.30.13 at 2:45p [moved forward from 09.06.13 at caller's request]; understood & agreed on all matters/SLS

## 2012-08-05 NOTE — Telephone Encounter (Signed)
Daughter in law made 2nd call to inquire as to the order for Laxative for patent: should it be given BID or PRN.?/SLS Please advise on this matter as well.

## 2012-08-06 ENCOUNTER — Encounter (HOSPITAL_COMMUNITY): Payer: Self-pay | Admitting: Emergency Medicine

## 2012-08-06 ENCOUNTER — Emergency Department (HOSPITAL_COMMUNITY): Payer: Medicare Other

## 2012-08-06 ENCOUNTER — Inpatient Hospital Stay (HOSPITAL_COMMUNITY)
Admission: EM | Admit: 2012-08-06 | Discharge: 2012-08-13 | DRG: 689 | Disposition: A | Discharge status: Skilled Nursing Facility | Payer: Medicare Other | Attending: Family Medicine | Admitting: Family Medicine

## 2012-08-06 DIAGNOSIS — T4275XA Adverse effect of unspecified antiepileptic and sedative-hypnotic drugs, initial encounter: Secondary | ICD-10-CM | POA: Diagnosis present

## 2012-08-06 DIAGNOSIS — N189 Chronic kidney disease, unspecified: Secondary | ICD-10-CM | POA: Diagnosis present

## 2012-08-06 DIAGNOSIS — G9341 Metabolic encephalopathy: Secondary | ICD-10-CM

## 2012-08-06 DIAGNOSIS — R0789 Other chest pain: Secondary | ICD-10-CM

## 2012-08-06 DIAGNOSIS — Z9181 History of falling: Secondary | ICD-10-CM

## 2012-08-06 DIAGNOSIS — K2970 Gastritis, unspecified, without bleeding: Secondary | ICD-10-CM

## 2012-08-06 DIAGNOSIS — W19XXXA Unspecified fall, initial encounter: Secondary | ICD-10-CM

## 2012-08-06 DIAGNOSIS — L603 Nail dystrophy: Secondary | ICD-10-CM

## 2012-08-06 DIAGNOSIS — K5900 Constipation, unspecified: Secondary | ICD-10-CM

## 2012-08-06 DIAGNOSIS — R059 Cough, unspecified: Secondary | ICD-10-CM

## 2012-08-06 DIAGNOSIS — N390 Urinary tract infection, site not specified: Principal | ICD-10-CM

## 2012-08-06 DIAGNOSIS — F3289 Other specified depressive episodes: Secondary | ICD-10-CM | POA: Diagnosis present

## 2012-08-06 DIAGNOSIS — D631 Anemia in chronic kidney disease: Secondary | ICD-10-CM | POA: Diagnosis present

## 2012-08-06 DIAGNOSIS — D649 Anemia, unspecified: Secondary | ICD-10-CM

## 2012-08-06 DIAGNOSIS — E871 Hypo-osmolality and hyponatremia: Secondary | ICD-10-CM

## 2012-08-06 DIAGNOSIS — I10 Essential (primary) hypertension: Secondary | ICD-10-CM

## 2012-08-06 DIAGNOSIS — H548 Legal blindness, as defined in USA: Secondary | ICD-10-CM | POA: Diagnosis present

## 2012-08-06 DIAGNOSIS — F329 Major depressive disorder, single episode, unspecified: Secondary | ICD-10-CM | POA: Diagnosis present

## 2012-08-06 DIAGNOSIS — W010XXA Fall on same level from slipping, tripping and stumbling without subsequent striking against object, initial encounter: Secondary | ICD-10-CM | POA: Diagnosis present

## 2012-08-06 DIAGNOSIS — L89150 Pressure ulcer of sacral region, unstageable: Secondary | ICD-10-CM

## 2012-08-06 DIAGNOSIS — M25551 Pain in right hip: Secondary | ICD-10-CM

## 2012-08-06 DIAGNOSIS — F411 Generalized anxiety disorder: Secondary | ICD-10-CM | POA: Diagnosis present

## 2012-08-06 DIAGNOSIS — M25519 Pain in unspecified shoulder: Secondary | ICD-10-CM

## 2012-08-06 DIAGNOSIS — Y9210 Unspecified residential institution as the place of occurrence of the external cause: Secondary | ICD-10-CM | POA: Diagnosis present

## 2012-08-06 DIAGNOSIS — L8995 Pressure ulcer of unspecified site, unstageable: Secondary | ICD-10-CM | POA: Diagnosis present

## 2012-08-06 DIAGNOSIS — R252 Cramp and spasm: Secondary | ICD-10-CM

## 2012-08-06 DIAGNOSIS — R05 Cough: Secondary | ICD-10-CM

## 2012-08-06 DIAGNOSIS — I129 Hypertensive chronic kidney disease with stage 1 through stage 4 chronic kidney disease, or unspecified chronic kidney disease: Secondary | ICD-10-CM | POA: Diagnosis present

## 2012-08-06 DIAGNOSIS — IMO0002 Reserved for concepts with insufficient information to code with codable children: Secondary | ICD-10-CM

## 2012-08-06 DIAGNOSIS — N182 Chronic kidney disease, stage 2 (mild): Secondary | ICD-10-CM

## 2012-08-06 DIAGNOSIS — N179 Acute kidney failure, unspecified: Secondary | ICD-10-CM

## 2012-08-06 DIAGNOSIS — E43 Unspecified severe protein-calorie malnutrition: Secondary | ICD-10-CM

## 2012-08-06 DIAGNOSIS — N039 Chronic nephritic syndrome with unspecified morphologic changes: Secondary | ICD-10-CM | POA: Diagnosis present

## 2012-08-06 DIAGNOSIS — F418 Other specified anxiety disorders: Secondary | ICD-10-CM

## 2012-08-06 DIAGNOSIS — M810 Age-related osteoporosis without current pathological fracture: Secondary | ICD-10-CM

## 2012-08-06 DIAGNOSIS — L89109 Pressure ulcer of unspecified part of back, unspecified stage: Secondary | ICD-10-CM | POA: Diagnosis present

## 2012-08-06 DIAGNOSIS — L89153 Pressure ulcer of sacral region, stage 3: Secondary | ICD-10-CM | POA: Diagnosis present

## 2012-08-06 DIAGNOSIS — A498 Other bacterial infections of unspecified site: Secondary | ICD-10-CM | POA: Diagnosis present

## 2012-08-06 DIAGNOSIS — D638 Anemia in other chronic diseases classified elsewhere: Secondary | ICD-10-CM

## 2012-08-06 DIAGNOSIS — E039 Hypothyroidism, unspecified: Secondary | ICD-10-CM

## 2012-08-06 DIAGNOSIS — M542 Cervicalgia: Secondary | ICD-10-CM

## 2012-08-06 DIAGNOSIS — G2581 Restless legs syndrome: Secondary | ICD-10-CM

## 2012-08-06 DIAGNOSIS — J310 Chronic rhinitis: Secondary | ICD-10-CM

## 2012-08-06 DIAGNOSIS — Z66 Do not resuscitate: Secondary | ICD-10-CM | POA: Diagnosis present

## 2012-08-06 DIAGNOSIS — E876 Hypokalemia: Secondary | ICD-10-CM

## 2012-08-06 DIAGNOSIS — G9349 Other encephalopathy: Secondary | ICD-10-CM | POA: Diagnosis present

## 2012-08-06 HISTORY — DX: Pressure ulcer of sacral region, stage 3: L89.153

## 2012-08-06 LAB — URINALYSIS, ROUTINE W REFLEX MICROSCOPIC
Bilirubin Urine: NEGATIVE
Ketones, ur: NEGATIVE mg/dL
Nitrite: NEGATIVE
Protein, ur: 30 mg/dL — AB
Urobilinogen, UA: 1 mg/dL (ref 0.0–1.0)

## 2012-08-06 LAB — COMPREHENSIVE METABOLIC PANEL
AST: 43 U/L — ABNORMAL HIGH (ref 0–37)
CO2: 26 mEq/L (ref 19–32)
Calcium: 8.4 mg/dL (ref 8.4–10.5)
Chloride: 103 mEq/L (ref 96–112)
Creatinine, Ser: 1.72 mg/dL — ABNORMAL HIGH (ref 0.50–1.10)
GFR calc Af Amer: 29 mL/min — ABNORMAL LOW (ref >90)
GFR calc non Af Amer: 25 mL/min — ABNORMAL LOW (ref >90)
Glucose, Bld: 149 mg/dL — ABNORMAL HIGH (ref 70–99)
Total Bilirubin: 1.2 mg/dL (ref 0.3–1.2)

## 2012-08-06 LAB — CBC WITH DIFFERENTIAL/PLATELET
Basophils Absolute: 0.1 10*3/uL (ref 0.0–0.1)
Basophils Relative: 1 % (ref 0–1)
Eosinophils Absolute: 0.4 10*3/uL (ref 0.0–0.7)
Hemoglobin: 11.1 g/dL — ABNORMAL LOW (ref 12.0–15.0)
MCH: 31.1 pg (ref 26.0–34.0)
MCHC: 33.5 g/dL (ref 30.0–36.0)
Monocytes Relative: 8 % (ref 3–12)
Neutrophils Relative %: 82 % — ABNORMAL HIGH (ref 43–77)
Platelets: 207 10*3/uL (ref 150–400)
RDW: 15.7 % — ABNORMAL HIGH (ref 11.5–15.5)

## 2012-08-06 LAB — URINE MICROSCOPIC-ADD ON

## 2012-08-06 MED ORDER — ROPINIROLE HCL 1 MG PO TABS
2.0000 mg | ORAL_TABLET | Freq: Two times a day (BID) | ORAL | Status: DC
  Administered 2012-08-07 – 2012-08-13 (×13): 2 mg via ORAL
  Filled 2012-08-06 (×18): qty 2

## 2012-08-06 MED ORDER — PIPERACILLIN-TAZOBACTAM 3.375 G IVPB
3.3750 g | Freq: Once | INTRAVENOUS | Status: AC
  Administered 2012-08-06: 3.375 g via INTRAVENOUS
  Filled 2012-08-06 (×2): qty 50

## 2012-08-06 MED ORDER — ONDANSETRON HCL 4 MG/2ML IJ SOLN: 4.0000 mg | Freq: Four times a day (QID) | INTRAMUSCULAR | Status: DC | PRN

## 2012-08-06 MED ORDER — HYDROCODONE-ACETAMINOPHEN 5-325 MG PO TABS: 1.0000 | ORAL_TABLET | ORAL | Status: DC | PRN

## 2012-08-06 MED ORDER — LEVOTHYROXINE SODIUM 88 MCG PO TABS
88.0000 ug | ORAL_TABLET | Freq: Every day | ORAL | Status: DC
  Administered 2012-08-07 – 2012-08-13 (×7): 88 ug via ORAL
  Filled 2012-08-06 (×9): qty 1

## 2012-08-06 MED ORDER — FLUTICASONE PROPIONATE 50 MCG/ACT NA SUSP
2.0000 | Freq: Every day | NASAL | Status: DC
  Administered 2012-08-07 – 2012-08-13 (×7): 2 via NASAL
  Filled 2012-08-06: qty 16

## 2012-08-06 MED ORDER — CLONIDINE HCL 0.1 MG PO TABS
0.1000 mg | ORAL_TABLET | Freq: Four times a day (QID) | ORAL | Status: DC | PRN
  Administered 2012-08-06 – 2012-08-08 (×2): 0.1 mg via ORAL
  Filled 2012-08-06 (×2): qty 1

## 2012-08-06 MED ORDER — ENSURE COMPLETE PO LIQD
237.0000 mL | Freq: Three times a day (TID) | ORAL | Status: DC
  Administered 2012-08-07 – 2012-08-13 (×18): 237 mL via ORAL

## 2012-08-06 MED ORDER — ALUM & MAG HYDROXIDE-SIMETH 200-200-20 MG/5ML PO SUSP: 30.0000 mL | Freq: Four times a day (QID) | ORAL | Status: DC | PRN

## 2012-08-06 MED ORDER — ENOXAPARIN SODIUM 30 MG/0.3ML ~~LOC~~ SOLN
30.0000 mg | SUBCUTANEOUS | Status: DC
  Administered 2012-08-07 – 2012-08-13 (×7): 30 mg via SUBCUTANEOUS
  Filled 2012-08-06 (×7): qty 0.3

## 2012-08-06 MED ORDER — CLONIDINE HCL 0.1 MG PO TABS: 0.1000 mg | ORAL_TABLET | Freq: Four times a day (QID) | ORAL | Status: DC | PRN

## 2012-08-06 MED ORDER — BISACODYL 5 MG PO TBEC
5.0000 mg | DELAYED_RELEASE_TABLET | Freq: Every day | ORAL | Status: DC | PRN
  Filled 2012-08-06 (×2): qty 1

## 2012-08-06 MED ORDER — ROPINIROLE HCL 1 MG PO TABS
1.0000 mg | ORAL_TABLET | Freq: Once | ORAL | Status: AC
  Administered 2012-08-06: 1 mg via ORAL
  Filled 2012-08-06: qty 1

## 2012-08-06 MED ORDER — PANTOPRAZOLE SODIUM 40 MG PO TBEC
40.0000 mg | DELAYED_RELEASE_TABLET | Freq: Every day | ORAL | Status: DC
  Administered 2012-08-07 – 2012-08-13 (×7): 40 mg via ORAL
  Filled 2012-08-06 (×7): qty 1

## 2012-08-06 MED ORDER — MAGNESIUM OXIDE 400 MG PO TABS
400.0000 mg | ORAL_TABLET | Freq: Every day | ORAL | Status: DC
  Administered 2012-08-07 – 2012-08-13 (×7): 400 mg via ORAL
  Filled 2012-08-06 (×7): qty 1

## 2012-08-06 MED ORDER — ROPINIROLE HCL 1 MG PO TABS
1.0000 mg | ORAL_TABLET | Freq: Two times a day (BID) | ORAL | Status: DC
  Administered 2012-08-07 – 2012-08-13 (×10): 1 mg via ORAL
  Filled 2012-08-06 (×15): qty 1

## 2012-08-06 MED ORDER — DEXTROSE 5 % IV SOLN
1.0000 g | INTRAVENOUS | Status: DC
  Administered 2012-08-07 – 2012-08-10 (×4): 1 g via INTRAVENOUS
  Filled 2012-08-06 (×4): qty 10

## 2012-08-06 MED ORDER — ONDANSETRON HCL 4 MG PO TABS: 4.0000 mg | ORAL_TABLET | Freq: Four times a day (QID) | ORAL | Status: DC | PRN

## 2012-08-06 MED ORDER — ENSURE PO LIQD: 237.0000 mL | Freq: Three times a day (TID) | ORAL | Status: DC

## 2012-08-06 MED ORDER — MELOXICAM 7.5 MG PO TABS
7.5000 mg | ORAL_TABLET | Freq: Every morning | ORAL | Status: DC
Start: 2012-08-07 — End: 2012-08-07
  Filled 2012-08-06: qty 1

## 2012-08-06 MED ORDER — BISACODYL 5 MG PO TBEC
5.0000 mg | DELAYED_RELEASE_TABLET | Freq: Every day | ORAL | Status: DC
  Administered 2012-08-07 – 2012-08-13 (×6): 5 mg via ORAL
  Filled 2012-08-06 (×4): qty 1

## 2012-08-06 MED ORDER — GABAPENTIN 300 MG PO CAPS
300.0000 mg | ORAL_CAPSULE | Freq: Every day | ORAL | Status: DC
  Administered 2012-08-06 – 2012-08-12 (×7): 300 mg via ORAL
  Filled 2012-08-06 (×8): qty 1

## 2012-08-06 MED ORDER — VANCOMYCIN HCL IN DEXTROSE 1-5 GM/200ML-% IV SOLN
1000.0000 mg | Freq: Once | INTRAVENOUS | Status: AC
  Administered 2012-08-06: 1000 mg via INTRAVENOUS
  Filled 2012-08-06: qty 200

## 2012-08-06 MED ORDER — SERTRALINE HCL 100 MG PO TABS
100.0000 mg | ORAL_TABLET | Freq: Every day | ORAL | Status: DC
  Administered 2012-08-07 – 2012-08-13 (×7): 100 mg via ORAL
  Filled 2012-08-06 (×7): qty 1

## 2012-08-06 MED ORDER — ACETAMINOPHEN 325 MG PO TABS: 650.0000 mg | ORAL_TABLET | Freq: Four times a day (QID) | ORAL | Status: DC | PRN

## 2012-08-06 MED ORDER — VITAMIN B-12 1000 MCG PO TABS
1000.0000 ug | ORAL_TABLET | Freq: Every day | ORAL | Status: DC
  Administered 2012-08-07 – 2012-08-13 (×7): 1000 ug via ORAL
  Filled 2012-08-06 (×7): qty 1

## 2012-08-06 MED ORDER — ATENOLOL 50 MG PO TABS
50.0000 mg | ORAL_TABLET | Freq: Every day | ORAL | Status: DC
  Administered 2012-08-07 – 2012-08-13 (×7): 50 mg via ORAL
  Filled 2012-08-06 (×7): qty 1

## 2012-08-06 NOTE — ED Notes (Signed)
Bed:WA18<BR> Expected date:<BR> Expected time:<BR> Means of arrival:<BR> Comments:<BR> fall

## 2012-08-06 NOTE — ED Notes (Signed)
Family states pt is "legally blind and will need assistance to eat meals"

## 2012-08-06 NOTE — ED Notes (Addendum)
Per EMS: Pt had an un-witnessed fall from a chair. Lives in assisted living. She was laying on her left side. Pt was sitting up on floor with staff holding back. Pt denies pain, no n/v. Neck flexed chin touching chest. Bruisng on arm fell last week. Normally gets around with walker or wheelchair. Altered mental status, Hx of HTN

## 2012-08-06 NOTE — ED Notes (Signed)
Patient transported to CT 

## 2012-08-06 NOTE — ED Provider Notes (Signed)
History     CSN: [id number]  Arrival date & time 08/06/12  1746   First MD Initiated Contact with Patient 08/06/12 1807      Chief Complaint  Patient presents with  . Fall    (Consider location/radiation/quality/duration/timing/severity/associated sxs/prior treatment) HPI Patient presents from a NH following a fall just prior to arrival. The patient is not a good historian and is unable to provided a good history.  Patient was found on the floor in her room following a fall. The patient fell 2 days ago and was evaluated here.The patient has not had fever, vomiting, diarrhea, or rectal bleeding. The family has provided much of the history.  Past Medical History  Diagnosis Date  . Arthritis   . History of chicken pox   . Depression     husband died 09-23-11  . Glaucoma   . Thyroid disease   . Hypertension   . Hypokalemia   . Renal disorder   . Altered mental state     Past Surgical History  Procedure Date  . Revision total knee arthroplasty     right knee  . Back surgery   . Cataract extraction, bilateral   . Thyroidectomy   . Abdominal hysterectomy     No family history on file.  History  Substance Use Topics  . Smoking status: Never Smoker   . Smokeless tobacco: Never Used  . Alcohol Use: No    OB History    Grav Para Term Preterm Abortions TAB SAB Ect Mult Living                  Review of Systems Level 5 caveat Allergies  Codeine; Doxycycline; Fentanyl; and Lisinopril  Home Medications   Current Outpatient Rx  Name Route Sig Dispense Refill  . ACETAMINOPHEN 325 MG PO TABS Oral Take 650 mg by mouth every 6 (six) hours as needed. For pain.    . ATENOLOL 50 MG PO TABS Oral Take 50 mg by mouth daily.      Marland Kitchen BISACODYL 5 MG PO TBEC Oral Take 5 mg by mouth daily.     Marland Kitchen ENSURE PO LIQD Oral Take 237 mLs by mouth 3 (three) times daily between meals. Ensure butter/pecan liquid    . FLUTICASONE PROPIONATE 50 MCG/ACT NA SUSP Nasal Place 2 sprays into the nose  daily.    . FUROSEMIDE 20 MG PO TABS Oral Take 20 mg by mouth daily.      Marland Kitchen GABAPENTIN 300 MG PO CAPS Oral Take 600 mg by mouth at bedtime.    Marland Kitchen HYDROCODONE-ACETAMINOPHEN 5-500 MG PO TABS Oral Take 1 tablet by mouth 2 (two) times daily.     Marland Kitchen LEVOTHYROXINE SODIUM 88 MCG PO TABS Oral Take 88 mcg by mouth daily.     Marland Kitchen LIDOCAINE 5 % EX PTCH Transdermal Place 0.25 patches onto the skin daily. Remove & Discard patch within 12 hours or as directed by MD; applied to neck.    Marland Kitchen LORAZEPAM 0.5 MG PO TABS Oral Take 0.5-1 mg by mouth 2 (two) times daily. 1 tab in am, 2 tab in pm    . MAGNESIUM OXIDE 400 MG PO TABS Oral Take 1 tablet (400 mg total) by mouth daily. 30 tablet 11  . MELOXICAM 15 MG PO TABS Oral Take 7.5 mg by mouth every morning.     Marland Kitchen PRESERVISION AREDS 2 PO Oral Take 1 tablet by mouth daily.    Marland Kitchen OMEPRAZOLE 40 MG PO CPDR Oral Take  1 capsule (40 mg total) by mouth daily. 30 capsule 3  . ROPINIROLE HCL 1 MG PO TABS Oral Take 1-2 mg by mouth 4 (four) times daily. 1mg  at 2AM; 2mg  at 8AM; 1mg  at [facility]; 2mg  at 8PM    . SERTRALINE HCL 100 MG PO TABS Oral Take 1 tablet (100 mg total) by mouth daily. 30 tablet 6  . VITAMIN B-12 1000 MCG PO TABS Oral Take 1,000 mcg by mouth daily.      BP 134/88  Pulse 73  Temp 98 F (36.7 C) (Oral)  Resp 24  SpO2 98%  Physical Exam  Constitutional: She appears well-developed and well-nourished. No distress.  HENT:  Head: Normocephalic and atraumatic.  Mouth/Throat: Oropharynx is clear and moist.  Eyes: Pupils are equal, round, and reactive to light.  Neck: Normal range of motion. Neck supple.  Cardiovascular: Normal rate, regular rhythm and normal heart sounds.   No murmur heard. Pulmonary/Chest: Effort normal and breath sounds normal.  Skin: Skin is warm and dry. No rash noted.       ED Course  Procedures (including critical care time)  Labs Reviewed  URINALYSIS, ROUTINE W REFLEX MICROSCOPIC - Abnormal; Notable for the following:    APPearance  CLOUDY (*)     Hgb urine dipstick SMALL (*)     Protein, ur 30 (*)     Leukocytes, UA LARGE (*)     All other components within normal limits  CBC WITH DIFFERENTIAL - Abnormal; Notable for the following:    WBC 11.8 (*)     RBC 3.57 (*)     Hemoglobin 11.1 (*)     HCT 33.1 (*)     RDW 15.7 (*)     Neutrophils Relative 82 (*)     Neutro Abs 9.7 (*)     Lymphocytes Relative 7 (*)     All other components within normal limits  URINE MICROSCOPIC-ADD ON - Abnormal; Notable for the following:    Squamous Epithelial / LPF FEW (*)     Bacteria, UA MANY (*)     All other components within normal limits  COMPREHENSIVE METABOLIC PANEL   Dg Pelvis 1-2 Views  08/06/2012  *RADIOLOGY REPORT*  Clinical Data: Fall, pain.  PELVIS - 1-2 VIEW  Comparison: CT of the pelvis 04/07/2012.  Findings: The hips are located.  No fracture is identified.  No notable degenerative change about the hips is seen.  Soft tissue structures are unremarkable. Bones appear osteopenic.  IMPRESSION: No acute finding.   Original Report Authenticated By: Bernadene Bell. Maricela Curet, M.D.    Ct Head Wo Contrast  08/06/2012  *RADIOLOGY REPORT*  Clinical Data:  Unwitnessed fall.  The patient was found lying on her side.  CT HEAD WITHOUT CONTRAST CT CERVICAL SPINE WITHOUT CONTRAST  Technique:  Multidetector CT imaging of the head and cervical spine was performed following the standard protocol without intravenous contrast.  Multiplanar CT image reconstructions of the cervical spine were also generated.  Comparison:  CT head and cervical spine 08/04/2012.  CT HEAD  Findings: No acute cortical infarct, hemorrhage, or mass lesion is present.  Moderate generalized atrophy and diffuse white matter disease is similar to the prior study.  The ventricles are proportionate to the degree of atrophy.  No significant extra-axial fluid collection is present.  The paranasal sinuses and mastoid air cells are clear.  No significant extracranial soft tissue injury  is evident.  The osseous skull is intact.  Atherosclerotic calcifications are again seen.  IMPRESSION:  1.  No acute intracranial abnormality or significant interval change. 2.  Stable atrophy and diffuse white matter disease. 3.  Atherosclerosis.  CT CERVICAL SPINE  Findings: The cervical spine is imaged from skull base through T2- 3.  Slight degenerative anterolisthesis at C4-5 and C5-6 is stable. No acute fracture or traumatic subluxation is evident.  The soft tissues are unremarkable.  Biapical pleural parenchymal scarring is present.  IMPRESSION:  1.  Stable degenerative changes of the cervical spine. 2.  No acute fracture or traumatic subluxation.   Original Report Authenticated By: Jamesetta Orleans. MATTERN, M.D.    Ct Cervical Spine Wo Contrast  08/06/2012  *RADIOLOGY REPORT*  Clinical Data:  Unwitnessed fall.  The patient was found lying on her side.  CT HEAD WITHOUT CONTRAST CT CERVICAL SPINE WITHOUT CONTRAST  Technique:  Multidetector CT imaging of the head and cervical spine was performed following the standard protocol without intravenous contrast.  Multiplanar CT image reconstructions of the cervical spine were also generated.  Comparison:  CT head and cervical spine 08/04/2012.  CT HEAD  Findings: No acute cortical infarct, hemorrhage, or mass lesion is present.  Moderate generalized atrophy and diffuse white matter disease is similar to the prior study.  The ventricles are proportionate to the degree of atrophy.  No significant extra-axial fluid collection is present.  The paranasal sinuses and mastoid air cells are clear.  No significant extracranial soft tissue injury is evident.  The osseous skull is intact.  Atherosclerotic calcifications are again seen.  IMPRESSION:  1.  No acute intracranial abnormality or significant interval change. 2.  Stable atrophy and diffuse white matter disease. 3.  Atherosclerosis.  CT CERVICAL SPINE  Findings: The cervical spine is imaged from skull base through T2-  3.  Slight degenerative anterolisthesis at C4-5 and C5-6 is stable. No acute fracture or traumatic subluxation is evident.  The soft tissues are unremarkable.  Biapical pleural parenchymal scarring is present.  IMPRESSION:  1.  Stable degenerative changes of the cervical spine. 2.  No acute fracture or traumatic subluxation.   Original Report Authenticated By: Jamesetta Orleans. MATTERN, M.D.      Patient to be admitted for UTI and altered mental status. Hospitalist called and will be down to see the patient.    MDM         Carlyle Dolly, PA-C 08/06/12 2054  Carlyle Dolly, PA-C 08/06/12 [phone]

## 2012-08-06 NOTE — ED Provider Notes (Signed)
Medical screening examination/treatment/procedure(s) were conducted as a shared visit with non-physician practitioner(s) and myself.  I personally evaluated the patient during the encounter  78 yo F from Nursing home for frequent falls and AMS. She is nonverbal currently but moves all extremities. She also has a sacral decub ulcer as per PA. UA + UTI, WBC 11.8. CT head/neck showed no acute changes. She is given iv abx and will be admitted for AMS and UTI.   Richardean Canal, MD 08/06/12 2131

## 2012-08-06 NOTE — H&P (Addendum)
PCP:   [name] [name], [name] Dach, MD   Chief Complaint:  Fall  HPI: This is a 78 year old female who resides at [facility]. She fell on Monday and was seen to the ER at [facility]. She was evaluated and sent back to nursing home. Today she again had an unwitnessed fall. She was found down in her apartment. She was transported to the ER. History provided by the patient's son and daughter in law both present at the bedside. Patient has been extremely lethargic and disoriented. Patient is on Neurontin, this was doubled approximately 3 weeks ago. Approximately 2 weeks ago she was noted to be more lethargic. For this reason her Neurontin was brought back down to 300 mg at night, however, patient lethargy continued. She has a poor appetite, not eating or drinking much. There is no report of any fever, chills, nausea, diarrhea, vomiting. Today she was noted to have sacral decubitus which per family's is new. She has become more immobile within recent weeks, she was brought to the ER because of the fall.  Review of Systems:  The patient denies anorexia, fever, weight loss,, vision loss, decreased hearing, hoarseness, chest pain, syncope, dyspnea on exertion, peripheral edema, balance deficits, hemoptysis, abdominal pain, melena, hematochezia, severe indigestion/heartburn, hematuria, incontinence, genital sores, muscle weakness, suspicious skin lesions, transient blindness,  depression, unusual weight change, abnormal bleeding, enlarged lymph nodes, angioedema, and breast masses.  Past Medical History: Past Medical History  Diagnosis Date  . Arthritis   . History of chicken pox   . Depression     husband died Sep 29, 2011  . Glaucoma   . Thyroid disease   . Hypertension   . Hypokalemia   . Renal disorder   . Altered mental state    Past Surgical History  Procedure Date  . Revision total knee arthroplasty     right knee  . Back surgery   . Cataract extraction, bilateral   .  Thyroidectomy   . Abdominal hysterectomy     Medications: Prior to Admission medications   Medication Sig Start Date End Date Taking? Authorizing Provider  acetaminophen (TYLENOL) 325 MG tablet Take 650 mg by mouth every 6 (six) hours as needed. For pain.   Yes Historical Provider, MD  atenolol (TENORMIN) 50 MG tablet Take 50 mg by mouth daily.     Yes Historical Provider, MD  bisacodyl (DULCOLAX) 5 MG EC tablet Take 5 mg by mouth daily.    Yes Historical Provider, MD  ENSURE (ENSURE) Take 237 mLs by mouth 3 (three) times daily between meals. Ensure butter/pecan liquid 02/29/12  Yes Historical Provider, MD  fluticasone (FLONASE) 50 MCG/ACT nasal spray Place 2 sprays into the nose daily.   Yes Historical Provider, MD  furosemide (LASIX) 20 MG tablet Take 20 mg by mouth daily.     Yes Historical Provider, MD  gabapentin (NEURONTIN) 300 MG capsule Take 600 mg by mouth at bedtime.   Yes Historical Provider, MD  HYDROcodone-acetaminophen (VICODIN) 5-500 MG per tablet Take 1 tablet by mouth 2 (two) times daily.    Yes Historical Provider, MD  levothyroxine (SYNTHROID, LEVOTHROID) 88 MCG tablet Take 88 mcg by mouth daily.    Yes Historical Provider, MD  lidocaine (LIDODERM) 5 % Place 0.25 patches onto the skin daily. Remove & Discard patch within 12 hours or as directed by MD; applied to neck.   Yes Historical Provider, MD  LORazepam (ATIVAN) 0.5 MG tablet Take 0.5-1 mg by mouth 2 (two) times  daily. 1 tab in am, 2 tab in pm   Yes Historical Provider, MD  magnesium oxide (MAG-OX 400) 400 MG tablet Take 1 tablet (400 mg total) by mouth daily. 06/17/12 06/17/13 Yes Edwyna Perfect, MD  meloxicam (MOBIC) 15 MG tablet Take 7.5 mg by mouth every morning.    Yes Historical Provider, MD  Multiple Vitamins-Minerals (PRESERVISION AREDS 2 PO) Take 1 tablet by mouth daily.   Yes Historical Provider, MD  omeprazole (PRILOSEC) 40 MG capsule Take 1 capsule (40 mg total) by mouth daily. 05/02/12 05/02/13 Yes Edwyna Perfect,  MD  rOPINIRole (REQUIP) 1 MG tablet Take 1-2 mg by mouth 4 (four) times daily. 1mg  at 2AM; 2mg  at 8AM; 1mg  at 2PM; 2mg  at 8PM   Yes Historical Provider, MD  sertraline (ZOLOFT) 100 MG tablet Take 1 tablet (100 mg total) by mouth daily. 06/17/12 06/17/13 Yes Edwyna Perfect, MD  vitamin B-12 (CYANOCOBALAMIN) 1000 MCG tablet Take 1,000 mcg by mouth daily.   Yes Historical Provider, MD    Allergies:   Allergies  Allergen Reactions  . Codeine Other (See Comments)    insomnia  . Doxycycline Diarrhea and Other (See Comments)    Hallucinations  . Fentanyl Hives and Rash  . Lisinopril Nausea And Vomiting, Rash and Hypertension    Social History:  reports that she has never smoked. She has never used smokeless tobacco. She reports that she does not drink alcohol or use illicit drugs. resides at [facility]. The patient status post attorney; [name] [name] [phone]. She uses a walker, she is now a fall risk.  Family History: Hypertension  Physical Exam: Filed Vitals:   08/06/12 1757 08/06/12 1801 08/06/12 2011  BP: 202/60 199/64 192/62  Pulse: 59 58 59  Temp: 98 F (36.7 C)    TempSrc: Oral    Resp: 15 13 16   SpO2: 99% 99% 98%    General:  Awake, well developed and nourished, no acute distress Eyes: PERRLA, pink conjunctiva, no scleral icterus ENT: Moist oral mucosa, neck supple, no thyromegaly Lungs: clear to ascultation, no wheeze, no crackles, no use of accessory muscles Cardiovascular: regular rate and rhythm, no regurgitation, no gallops, 3/6 systolic ejection murmur. No carotid bruits, no JVD Abdomen: soft, positive BS, non-tender, non-distended, no organomegaly, not an acute abdomen GU: not examined Neuro: CN II - XII grossly intact, sensation intact Musculoskeletal: strength 5/5 all extremities, no clubbing, cyanosis or edema Skin: no rash, no subcutaneous crepitation, no decubitus, unstageable sacral decubital with central eschar, some surrounding  cellulitis,    Labs on Admission:  No results found for this basename: NA:2,K:2,CL:2,CO2:2,GLUCOSE:2,BUN:2,CREATININE:2,CALCIUM:2,MG:2,PHOS:2 in the last 72 hours No results found for this basename: AST:2,ALT:2,ALKPHOS:2,BILITOT:2,PROT:2,ALBUMIN:2 in the last 72 hours No results found for this basename: LIPASE:2,AMYLASE:2 in the last 72 hours  Basename 08/06/12 1935  WBC 11.8*  NEUTROABS 9.7*  HGB 11.1*  HCT 33.1*  MCV 92.7  PLT 207  Results for [name], [name] (MRN [id number]) as of 08/06/2012 23:28   Ref. Range 08/06/2012 21:50  Sodium Latest Range: 135-145 mEq/L 139  Potassium Latest Range: 3.5-5.1 mEq/L 3.2 (L)  Chloride Latest Range: 96-112 mEq/L 103  CO2 Latest Range: 19-32 mEq/L 26  BUN Latest Range: 6-23 mg/dL 43 (H)  Creatinine Latest Range: 0.50-1.10 mg/dL 1.30 (H)  Calcium Latest Range: 8.4-10.5 mg/dL 8.4  GFR calc non Af Amer Latest Range: >90 mL/min 25 (L)  GFR calc Af Amer Latest Range: >90 mL/min 29 (L)  Glucose Latest Range: 70-99  mg/dL 454 (H)  Alkaline Phosphatase Latest Range: 39-117 U/L 502 (H)  Albumin Latest Range: 3.5-5.2 g/dL 2.6 (L)  AST Latest Range: 0-37 U/L 43 (H)  ALT Latest Range: 0-35 U/L 42 (H)  Total Protein Latest Range: 6.0-8.3 g/dL 7.0  Total Bilirubin Latest Range: 0.3-1.2 mg/dL 1.2   No results found for this basename: CKTOTAL:3,CKMB:3,CKMBINDEX:3,TROPONINI:3 in the last 72 hours No components found with this basename: POCBNP:3 No results found for this basename: DDIMER:2 in the last 72 hours No results found for this basename: HGBA1C:2 in the last 72 hours No results found for this basename: CHOL:2,HDL:2,LDLCALC:2,TRIG:2,CHOLHDL:2,LDLDIRECT:2 in the last 72 hours No results found for this basename: TSH,T4TOTAL,FREET3,T3FREE,THYROIDAB in the last 72 hours No results found for this basename: VITAMINB12:2,FOLATE:2,FERRITIN:2,TIBC:2,IRON:2,RETICCTPCT:2 in the last 72 hours  Micro Results: No results found for this or any previous visit  (from the past 240 hour(s)). Results for [name], [name] (MRN [id number]) as of 08/06/2012 22:21   Ref. Range 08/06/2012 18:51  Color, Urine Latest Range: YELLOW  YELLOW  APPearance Latest Range: CLEAR  CLOUDY (A)  Specific Gravity, Urine Latest Range: 1.005-1.030  1.017  pH Latest Range: 5.0-8.0  7.0  Glucose Latest Range: NEGATIVE mg/dL NEGATIVE  Bilirubin Urine Latest Range: NEGATIVE  NEGATIVE  Ketones, ur Latest Range: NEGATIVE mg/dL NEGATIVE  Protein Latest Range: NEGATIVE mg/dL 30 (A)  Urobilinogen, UA Latest Range: 0.0-1.0 mg/dL 1.0  Nitrite Latest Range: NEGATIVE  NEGATIVE  Leukocytes, UA Latest Range: NEGATIVE  LARGE (A)  Hgb urine dipstick Latest Range: NEGATIVE  SMALL (A)  WBC, UA Latest Range: <3 WBC/hpf TOO NUMEROUS TO COUNT  RBC / HPF Latest Range: <3 RBC/hpf 3-6  Squamous Epithelial / LPF Latest Range: RARE  FEW (A)  Bacteria, UA Latest Range: RARE  MANY (A)    Radiological Exams on Admission: Dg Pelvis 1-2 Views  08/06/2012  *RADIOLOGY REPORT*  Clinical Data: Fall, pain.  PELVIS - 1-2 VIEW  Comparison: CT of the pelvis 04/07/2012.  Findings: The hips are located.  No fracture is identified.  No notable degenerative change about the hips is seen.  Soft tissue structures are unremarkable. Bones appear osteopenic.  IMPRESSION: No acute finding.   Original Report Authenticated By: Bernadene Bell. Maricela Curet, M.D.    Ct Head Wo Contrast  08/06/2012  *RADIOLOGY REPORT*  Clinical Data:  Unwitnessed fall.  The patient was found lying on her side.  CT HEAD WITHOUT CONTRAST CT CERVICAL SPINE WITHOUT CONTRAST  Technique:  Multidetector CT imaging of the head and cervical spine was performed following the standard protocol without intravenous contrast.  Multiplanar CT image reconstructions of the cervical spine were also generated.  Comparison:  CT head and cervical spine 08/04/2012.  CT HEAD  Findings: No acute cortical infarct, hemorrhage, or mass lesion is present.  Moderate generalized  atrophy and diffuse white matter disease is similar to the prior study.  The ventricles are proportionate to the degree of atrophy.  No significant extra-axial fluid collection is present.  The paranasal sinuses and mastoid air cells are clear.  No significant extracranial soft tissue injury is evident.  The osseous skull is intact.  Atherosclerotic calcifications are again seen.  IMPRESSION:  1.  No acute intracranial abnormality or significant interval change. 2.  Stable atrophy and diffuse white matter disease. 3.  Atherosclerosis.  CT CERVICAL SPINE  Findings: The cervical spine is imaged from skull base through T2- 3.  Slight degenerative anterolisthesis at C4-5 and C5-6 is stable. No acute fracture or traumatic subluxation is  evident.  The soft tissues are unremarkable.  Biapical pleural parenchymal scarring is present.  IMPRESSION:  1.  Stable degenerative changes of the cervical spine. 2.  No acute fracture or traumatic subluxation.   Original Report Authenticated By: Jamesetta Orleans. MATTERN, M.D.    Ct Cervical Spine Wo Contrast  08/06/2012  *RADIOLOGY REPORT*  Clinical Data:  Unwitnessed fall.  The patient was found lying on her side.  CT HEAD WITHOUT CONTRAST CT CERVICAL SPINE WITHOUT CONTRAST  Technique:  Multidetector CT imaging of the head and cervical spine was performed following the standard protocol without intravenous contrast.  Multiplanar CT image reconstructions of the cervical spine were also generated.  Comparison:  CT head and cervical spine 08/04/2012.  CT HEAD  Findings: No acute cortical infarct, hemorrhage, or mass lesion is present.  Moderate generalized atrophy and diffuse white matter disease is similar to the prior study.  The ventricles are proportionate to the degree of atrophy.  No significant extra-axial fluid collection is present.  The paranasal sinuses and mastoid air cells are clear.  No significant extracranial soft tissue injury is evident.  The osseous skull is intact.   Atherosclerotic calcifications are again seen.  IMPRESSION:  1.  No acute intracranial abnormality or significant interval change. 2.  Stable atrophy and diffuse white matter disease. 3.  Atherosclerosis.  CT CERVICAL SPINE  Findings: The cervical spine is imaged from skull base through T2- 3.  Slight degenerative anterolisthesis at C4-5 and C5-6 is stable. No acute fracture or traumatic subluxation is evident.  The soft tissues are unremarkable.  Biapical pleural parenchymal scarring is present.  IMPRESSION:  1.  Stable degenerative changes of the cervical spine. 2.  No acute fracture or traumatic subluxation.   Original Report Authenticated By: Jamesetta Orleans. MATTERN, M.D.     Assessment/Plan Present on Admission:  .UTI (lower urinary tract infection) .Metabolic encephalopathy Admit to MedSurg  Rocephin ordered  Cultures pending  .Fall Likely due to the above  Sacral decubital-unstageable  Surrounding cellulitis  Vancomycin ordered  Woundcare consulted  Nursing to turn every 2 hours  .acute on CKD (chronic kidney disease) stage 2, GFR 60-89 ml/min Gentle IVF hydration ordered .Constipation .Depression with anxiety .Hypothyroidism .RLS (restless legs syndrome) Resume home medications As needed Constipation medications ordered As needed Blood pressure medications ordered   DO NOT RESUSCITATE DVT prophylaxis  Hatcher Froning 08/06/2012, 10:18 PM and

## 2012-08-07 DIAGNOSIS — E876 Hypokalemia: Secondary | ICD-10-CM

## 2012-08-07 DIAGNOSIS — D649 Anemia, unspecified: Secondary | ICD-10-CM

## 2012-08-07 DIAGNOSIS — N182 Chronic kidney disease, stage 2 (mild): Secondary | ICD-10-CM

## 2012-08-07 LAB — BASIC METABOLIC PANEL
Chloride: 107 mEq/L (ref 96–112)
GFR calc Af Amer: 29 mL/min — ABNORMAL LOW (ref >90)
GFR calc non Af Amer: 25 mL/min — ABNORMAL LOW (ref >90)
Potassium: 3.1 mEq/L — ABNORMAL LOW (ref 3.5–5.1)
Sodium: 141 mEq/L (ref 135–145)

## 2012-08-07 LAB — CBC
HCT: 26.8 % — ABNORMAL LOW (ref 36.0–46.0)
Hemoglobin: 9.1 g/dL — ABNORMAL LOW (ref 12.0–15.0)
RBC: 2.89 MIL/uL — ABNORMAL LOW (ref 3.87–5.11)

## 2012-08-07 LAB — TSH: TSH: 1.881 u[IU]/mL (ref 0.350–4.500)

## 2012-08-07 LAB — MRSA PCR SCREENING: MRSA by PCR: NEGATIVE

## 2012-08-07 MED ORDER — COLLAGENASE 250 UNIT/GM EX OINT
TOPICAL_OINTMENT | Freq: Every day | CUTANEOUS | Status: DC
  Administered 2012-08-07 – 2012-08-13 (×7): via TOPICAL
  Filled 2012-08-07: qty 30

## 2012-08-07 MED ORDER — POTASSIUM CHLORIDE CRYS ER 20 MEQ PO TBCR
40.0000 meq | EXTENDED_RELEASE_TABLET | Freq: Once | ORAL | Status: AC
  Administered 2012-08-07: 40 meq via ORAL
  Filled 2012-08-07: qty 2

## 2012-08-07 MED ORDER — VANCOMYCIN HCL IN DEXTROSE 1-5 GM/200ML-% IV SOLN: 1000.0000 mg | INTRAVENOUS | Status: DC

## 2012-08-07 MED ORDER — POTASSIUM CHLORIDE IN NACL 20-0.9 MEQ/L-% IV SOLN
INTRAVENOUS | Status: AC
  Administered 2012-08-07: 09:00:00 via INTRAVENOUS
  Filled 2012-08-07 (×2): qty 1000

## 2012-08-07 NOTE — Consult Note (Signed)
WOC consult Note Reason for Consult: eval sacral pressure ulcer. Pt from assisted living and has sustained some recent falls.  Wound type: Unstageable pressure ulcer of the sacrum Pressure Ulcer POA: Yes Measurement: 3.0cm x 3.0cm x 0.2cm  Wound bed:100% soft eschar Drainage (amount, consistency, odor) minimal with no odor Periwound:some surrounding erythema but not siginificant Dressing procedure/placement/frequency: Will order enzymatic debridement agent to begin debridement process, this area is still quite adherent therefore I do not think hydrotherapy will be of benefit until this loosens a bit.  Daily application of Santyl, cover with dry dressing. Turn from side to side at least every two hours and I will add LALM for pressure redistribution and moisture management.  Discussed with bedside nurse.   Will follow up in one week to see if eschar as loosened enough for mechanical debridement with hydrotherapy.  If pt should dc back to AS or SNF would recommend continuation of Santyl as ordered.  Thanks  Koah Chisenhall [employer], CWOCN [phone])

## 2012-08-07 NOTE — Progress Notes (Signed)
ANTIBIOTIC CONSULT NOTE - INITIAL  Pharmacy Consult for  Vancomycin Indication: Cellulits/Sacral decubital ulcer  Allergies  Allergen Reactions  . Codeine Other (See Comments)    insomnia  . Doxycycline Diarrhea and Other (See Comments)    Hallucinations  . Fentanyl Hives and Rash  . Lisinopril Nausea And Vomiting, Rash and Hypertension    Patient Measurements: Height: 5\' 1"  (154.9 cm) Weight: 116 lb 2.9 oz (52.7 kg) IBW/kg (Calculated) : 47.8    Vital Signs: Temp: 98.2 F (36.8 C) (08/28 2253) Temp src: Oral (08/28 2253) BP: 170/50 mmHg (08/28 2253) Pulse Rate: 59  (08/28 2253) Intake/Output from previous day:   Intake/Output from this shift:    Labs:  [facility] 08/06/12 2150 08/06/12 1935  WBC -- 11.8*  HGB -- 11.1*  PLT -- 207  LABCREA -- --  CREATININE 1.72* --   Estimated Creatinine Clearance: 16.4 ml/min (by C-G formula based on Cr of 1.72). No results found for this basename: VANCOTROUGH:2,VANCOPEAK:2,VANCORANDOM:2,GENTTROUGH:2,GENTPEAK:2,GENTRANDOM:2,TOBRATROUGH:2,TOBRAPEAK:2,TOBRARND:2,AMIKACINPEAK:2,AMIKACINTROU:2,AMIKACIN:2, in the last 72 hours   Microbiology: No results found for this or any previous visit (from the past 720 hour(s)).  Medical History: Past Medical History  Diagnosis Date  . Arthritis   . History of chicken pox   . Depression     husband died 09/22/2011  . Glaucoma   . Thyroid disease   . Hypertension   . Hypokalemia   . Renal disorder   . Altered mental state     Medications:  Scheduled:    . atenolol  50 mg Oral Daily  . bisacodyl  5 mg Oral Daily  . cefTRIAXone (ROCEPHIN)  IV  1 g Intravenous Q24H  . enoxaparin (LOVENOX) injection  30 mg Subcutaneous Q24H  . feeding supplement  237 mL Oral TID BM  . fluticasone  2 spray Each Nare Daily  . gabapentin  300 mg Oral QHS  . levothyroxine  88 mcg Oral Daily  . magnesium oxide  400 mg Oral Daily  . meloxicam  7.5 mg Oral q morning - 10a  . pantoprazole  40 mg Oral Q1200    . piperacillin-tazobactam (ZOSYN)  IV  3.375 g Intravenous Once  . rOPINIRole  1 mg Oral Q12H  . rOPINIRole  1 mg Oral Once  . rOPINIRole  2 mg Oral Q12H  . sertraline  100 mg Oral Daily  . vancomycin  1,000 mg Intravenous Once  . vitamin B-12  1,000 mcg Oral Daily  . DISCONTD: ENSURE  237 mL Oral TID BM   Infusions:   Assessment: 78 yo female admitted with UTI and sacral decubital ulcer/Cellultis.  Goal of Therapy:  Vancomycin trough level 10-15 mcg/ml  Plan:   Vancomycin 1Gm IV q48h.  CrCl ~ 25 (N)  F/U SCr/levels as needed  Susanne Greenhouse R 08/07/2012,12:18 AM

## 2012-08-07 NOTE — Progress Notes (Signed)
Order received for Speech & Language evaluation.  Chart reviewed and it is not clear if a SLE is indicated.  Patient is on a Regular consistency diet with thin liquids.  RN states patient does not appear to have dysphagia, but is not eating much.  RN is uncertain if patient needs a SLE or Bedside Swallow eval.  MD please clarify.    Maryjo Rochester T 08/07/2012, 5:12 PM

## 2012-08-07 NOTE — Progress Notes (Signed)
Clinical Social Work Department BRIEF PSYCHOSOCIAL ASSESSMENT 08/07/2012  Patient:  [name] [name],[name] [name]     Account Number:  [id number]     Admit date:  08/06/2012  Clinical Social Worker:  Doroteo Glassman  Date/Time:  08/07/2012 02:44 PM  Referred by:  Physician  Date Referred:  08/07/2012 Referred for  SNF Placement   Other Referral:   Interview type:  Other - See comment Other interview type:   Pt's daughter-in-law, [name] [name].    PSYCHOSOCIAL DATA Living Status:  FACILITY Admitted from facility:  [facility] Level of care:  Assisted Living Primary support name:  [name] and Luke Primary support relationship to patient:  CHILD, ADULT Degree of support available:   Strong    CURRENT CONCERNS Current Concerns  Post-Acute Placement   Other Concerns:    SOCIAL WORK ASSESSMENT / PLAN Per RN, Pt not able to participate in meaningful conversation.    Spoke with Pt's daughter-in-law over the phone.    [name] feels that SNF may be warranted and agreed to allow CSW to fax Pt's information to area SNFs.  Family states that Pt has been to [facility] by hx and had a good experience.  Family hopeful for [facility].    CSW to leave SNF list in Pt's room.    CSW thanked family for their time.   Assessment/plan status:  Psychosocial Support/Ongoing Assessment of Needs Other assessment/ plan:   Information/referral to community resources:   Left in room    PATIENT'S/FAMILY'S RESPONSE TO PLAN OF CARE: Family thanked CSW for time and assistance.  CSW to continue to follow.  Providence Crosby, LCSWA Clinical Social Work [phone]

## 2012-08-07 NOTE — Progress Notes (Signed)
[facility]                                                                                Patient Demographics  [name] [name], is a 78 y.o. female  ZOX:[id number]  [id number]  DOB - Aug 24, 1919  Admit date - 08/06/2012  Admitting Physician Gery Pray, MD  Outpatient Primary MD for the patient is Letitia Libra, Ala Dach, MD  LOS - 1   Chief Complaint  Patient presents with  . Fall        Assessment & Plan    1. Multiple Mechanical Falls - she will be seen by physical therapy evaluated for balance and needs for walking aids, might need placement. CT scan of the brain and spine x-ray of the hips stable.    2. History of Hypothyroidism - home dose Synthroid to be continued we'll check a baseline TSH.   Lab Results  Component Value Date   TSH 3.134 01/17/2012    3. Depression with anxiety, RLS - new home medications no acute issues   4. HTN - and Lopressor we'll continue.   5.Hypokalemia - replace and recheck with magnesium levels later.   6. ARF - due to dehydration, IV fluids for hydration hold any offending medications at this time.    7. UTI (lower urinary tract infection) - empiric IV Rocephin.   8. Decubitus ulcer of sacral region, unstageable - with minimal surrounding cellulitis no discharge no signs of active infection we'll withhold any prolonged antibiotics. She is afebrile with no leukocytosis.   9. Anemia of chronic disease no acute issues we'll monitor expect to fall further with dilution after IV fluids.    Code Status: DO NOT RESUSCITATE  Family Communication: Discussed with the patient  Disposition Plan: To rehabilitation    Procedures none   Consults  wound care, PT   Time Spent in minutes  35   Antibiotics    Anti-infectives     Start     Dose/Rate Route Frequency Ordered Stop   08/08/12 2000   vancomycin (VANCOCIN) IVPB 1000 mg/200 mL premix        1,000 mg 200 mL/hr over 60 Minutes  Intravenous Every 48 hours 08/07/12 0028     08/06/12 2359   cefTRIAXone (ROCEPHIN) 1 g in dextrose 5 % 50 mL IVPB        1 g 100 mL/hr over 30 Minutes Intravenous Every 24 hours 08/06/12 2301     08/06/12 2030   vancomycin (VANCOCIN) IVPB 1000 mg/200 mL premix        1,000 mg 200 mL/hr over 60 Minutes Intravenous  Once 08/06/12 2000 08/06/12 2146   08/06/12 2000  piperacillin-tazobactam (ZOSYN) IVPB 3.375 g       3.375 g 100 mL/hr over 30 Minutes Intravenous  Once 08/06/12 2000 08/06/12 2224          Scheduled Meds:   . atenolol  50 mg Oral Daily  . bisacodyl  5 mg Oral Daily  . cefTRIAXone (ROCEPHIN)  IV  1 g Intravenous Q24H  . collagenase   Topical Daily  . enoxaparin (LOVENOX) injection  30 mg Subcutaneous Q24H  . feeding supplement  237 mL Oral TID BM  . fluticasone  2 spray Each Nare Daily  . gabapentin  300 mg Oral QHS  . levothyroxine  88 mcg Oral Daily  . magnesium oxide  400 mg Oral Daily  . pantoprazole  40 mg Oral Q1200  . piperacillin-tazobactam (ZOSYN)  IV  3.375 g Intravenous Once  . potassium chloride  40 mEq Oral Once  . rOPINIRole  1 mg Oral Q12H  . rOPINIRole  1 mg Oral Once  . rOPINIRole  2 mg Oral Q12H  . sertraline  100 mg Oral Daily  . vancomycin  1,000 mg Intravenous Once  . vancomycin  1,000 mg Intravenous Q48H  . vitamin B-12  1,000 mcg Oral Daily  . DISCONTD: ENSURE  237 mL Oral TID BM  . DISCONTD: meloxicam  7.5 mg Oral q morning - 10a   Continuous Infusions:   . 0.9 % NaCl with KCl 20 mEq / L 75 mL/hr at 08/07/12 0853   PRN Meds:.acetaminophen, alum & mag hydroxide-simeth, bisacodyl, cloNIDine, HYDROcodone-acetaminophen, ondansetron (ZOFRAN) IV, ondansetron, DISCONTD: cloNIDine   DVT Prophylaxis  Lovenox     Leroy Sea M.D on 08/07/2012 at 10:58 AM  Between 7am to 7pm - Pager - [phone]  After 7pm go to [email or web address] - password [email or web address]  And look for the night coverage person covering for me after hours  [facility] Office  [phone]    Subjective:   Manvi Guilliams today has, No headache, No chest pain, No abdominal pain - No Nausea, No new weakness tingling or numbness, No Cough - SOB.    Objective:   Filed Vitals:   08/06/12 2231 08/06/12 2253 08/06/12 2330 08/07/12 0530  BP:  170/50 158/64 152/44  Pulse:  59  53  Temp: 99.5 F (37.5 C) 98.2 F (36.8 C)  98.2 F (36.8 C)  TempSrc: Oral Oral  Oral  Resp:  16  16  Height:  5\' 1"  (1.549 m)    Weight:  52.7 kg (116 lb 2.9 oz)    SpO2:  99%  96%    Wt Readings from Last 3 Encounters:  08/06/12 52.7 kg (116 lb 2.9 oz)  06/17/12 52.164 kg (115 lb)  05/02/12 54.432 kg (120 lb)     Intake/Output Summary (Last 24 hours) at 08/07/12 1058 Last data filed at 08/07/12 0900  Gross per 24 hour  Intake    170 ml  Output      0 ml  Net    170 ml    Exam Awake Alert, Oriented X 3, No new F.N deficits, Normal affect [place].AT,PERRAL Supple Neck,No JVD, No cervical lymphadenopathy appriciated.  Symmetrical Chest wall movement, Good air movement bilaterally, CTAB RRR,No Gallops,Rubs or new Murmurs, No Parasternal Heave +ve B.Sounds, Abd Soft, Non tender, No organomegaly appriciated, No rebound - guarding or rigidity. No Cyanosis, Clubbing or edema, No new Rash or bruise , necrotic unstageable sacral decubitus ulcer of about 1 cm in diameter noted.   Data Review   Micro Results Recent Results (from the past 240 hour(s))  MRSA PCR SCREENING     Status: Normal   Collection Time   08/07/12 12:00 AM      Component Value Range Status Comment   MRSA by PCR NEGATIVE  NEGATIVE Final     Radiology Reports Dg Pelvis 1-2 Views  08/06/2012  *RADIOLOGY REPORT*  Clinical Data: Fall, pain.  PELVIS - 1-2 VIEW  Comparison: CT of the pelvis 04/07/2012.  Findings: The hips are located.  No fracture is identified.  No notable degenerative change about the hips is seen.  Soft tissue structures are unremarkable. Bones appear osteopenic.  IMPRESSION: No  acute finding.   Original Report Authenticated By: Bernadene Bell. Maricela Curet, M.D.    Ct Head Wo Contrast  08/06/2012  *RADIOLOGY REPORT*  Clinical Data:  Unwitnessed fall.  The patient was found lying on her side.  CT HEAD WITHOUT CONTRAST CT CERVICAL SPINE WITHOUT CONTRAST  Technique:  Multidetector CT imaging of the head and cervical spine was performed following the standard protocol without intravenous contrast.  Multiplanar CT image reconstructions of the cervical spine were also generated.  Comparison:  CT head and cervical spine 08/04/2012.  CT HEAD  Findings: No acute cortical infarct, hemorrhage, or mass lesion is present.  Moderate generalized atrophy and diffuse white matter disease is similar to the prior study.  The ventricles are proportionate to the degree of atrophy.  No significant extra-axial fluid collection is present.  The paranasal sinuses and mastoid air cells are clear.  No significant extracranial soft tissue injury is evident.  The osseous skull is intact.  Atherosclerotic calcifications are again seen.  IMPRESSION:  1.  No acute intracranial abnormality or significant interval change. 2.  Stable atrophy and diffuse white matter disease. 3.  Atherosclerosis.  CT CERVICAL SPINE  Findings: The cervical spine is imaged from skull base through T2- 3.  Slight degenerative anterolisthesis at C4-5 and C5-6 is stable. No acute fracture or traumatic subluxation is evident.  The soft tissues are unremarkable.  Biapical pleural parenchymal scarring is present.  IMPRESSION:  1.  Stable degenerative changes of the cervical spine. 2.  No acute fracture or traumatic subluxation.   Original Report Authenticated By: Jamesetta Orleans. MATTERN, M.D.    Ct Head Wo Contrast  08/04/2012  *RADIOLOGY REPORT*  Clinical Data:  Fall from chair, striking her head.  CT HEAD WITHOUT CONTRAST CT CERVICAL SPINE WITHOUT CONTRAST  Technique:  Multidetector CT imaging of the head and cervical spine was performed following the  standard protocol without intravenous contrast.  Multiplanar CT image reconstructions of the cervical spine were also generated.  Comparison:  CT head and cervical spine without contrast 01/17/2012.  CT HEAD  Findings: Mild atrophy and periventricular white matter hypoattenuation is similar to the prior exam.  No acute cortical infarct, hemorrhage, or mass lesion is present.  Vascular calcifications are again noted within the cavernous carotid arteries bilaterally.  The visualized paranasal sinuses and mastoid air cells are clear. The osseous skull is intact.  IMPRESSION:  1.  No acute intracranial abnormality or significant interval change. 2.  Stable atrophy and diffuse white matter disease.  CT CERVICAL SPINE  Findings: Cervical spine is imaged from skull base through T9. Exaggerated cervical kyphosis is more pronounced than on the prior exam.  The vertebral body heights and alignment are maintained. Mild endplate degenerative changes are again seen.  Endplate osteophyte formation is most pronounced at C4-5, as before.  The soft tissues are unremarkable.  The lung apices demonstrate mild dependent atelectasis bilaterally.  Coronary artery calcifications are present.  The heart is enlarged.  IMPRESSION:  1.  Exaggerated cervical thoracic kyphosis. 2.  No acute abnormality. 3.  Stable degenerative changes. 4.  Cardiomegaly with evidence for coronary artery disease.   Original Report Authenticated By: Jamesetta Orleans. MATTERN, M.D.    Ct Cervical Spine Wo Contrast  08/06/2012  *RADIOLOGY REPORT*  Clinical Data:  Unwitnessed fall.  The patient was found lying on her  side.  CT HEAD WITHOUT CONTRAST CT CERVICAL SPINE WITHOUT CONTRAST  Technique:  Multidetector CT imaging of the head and cervical spine was performed following the standard protocol without intravenous contrast.  Multiplanar CT image reconstructions of the cervical spine were also generated.  Comparison:  CT head and cervical spine 08/04/2012.  CT HEAD   Findings: No acute cortical infarct, hemorrhage, or mass lesion is present.  Moderate generalized atrophy and diffuse white matter disease is similar to the prior study.  The ventricles are proportionate to the degree of atrophy.  No significant extra-axial fluid collection is present.  The paranasal sinuses and mastoid air cells are clear.  No significant extracranial soft tissue injury is evident.  The osseous skull is intact.  Atherosclerotic calcifications are again seen.  IMPRESSION:  1.  No acute intracranial abnormality or significant interval change. 2.  Stable atrophy and diffuse white matter disease. 3.  Atherosclerosis.  CT CERVICAL SPINE  Findings: The cervical spine is imaged from skull base through T2- 3.  Slight degenerative anterolisthesis at C4-5 and C5-6 is stable. No acute fracture or traumatic subluxation is evident.  The soft tissues are unremarkable.  Biapical pleural parenchymal scarring is present.  IMPRESSION:  1.  Stable degenerative changes of the cervical spine. 2.  No acute fracture or traumatic subluxation.   Original Report Authenticated By: Jamesetta Orleans. MATTERN, M.D.    Ct Cervical Spine Wo Contrast  08/04/2012  *RADIOLOGY REPORT*  Clinical Data:  Fall from chair, striking her head.  CT HEAD WITHOUT CONTRAST CT CERVICAL SPINE WITHOUT CONTRAST  Technique:  Multidetector CT imaging of the head and cervical spine was performed following the standard protocol without intravenous contrast.  Multiplanar CT image reconstructions of the cervical spine were also generated.  Comparison:  CT head and cervical spine without contrast 01/17/2012.  CT HEAD  Findings: Mild atrophy and periventricular white matter hypoattenuation is similar to the prior exam.  No acute cortical infarct, hemorrhage, or mass lesion is present.  Vascular calcifications are again noted within the cavernous carotid arteries bilaterally.  The visualized paranasal sinuses and mastoid air cells are clear. The osseous  skull is intact.  IMPRESSION:  1.  No acute intracranial abnormality or significant interval change. 2.  Stable atrophy and diffuse white matter disease.  CT CERVICAL SPINE  Findings: Cervical spine is imaged from skull base through T9. Exaggerated cervical kyphosis is more pronounced than on the prior exam.  The vertebral body heights and alignment are maintained. Mild endplate degenerative changes are again seen.  Endplate osteophyte formation is most pronounced at C4-5, as before.  The soft tissues are unremarkable.  The lung apices demonstrate mild dependent atelectasis bilaterally.  Coronary artery calcifications are present.  The heart is enlarged.  IMPRESSION:  1.  Exaggerated cervical thoracic kyphosis. 2.  No acute abnormality. 3.  Stable degenerative changes. 4.  Cardiomegaly with evidence for coronary artery disease.   Original Report Authenticated By: Jamesetta Orleans. MATTERN, M.D.     CBC  Lab 08/07/12 0450 08/06/12 1935  WBC 10.2 11.8*  HGB 9.1* 11.1*  HCT 26.8* 33.1*  PLT 243 207  MCV 92.7 92.7  MCH 31.5 31.1  MCHC 34.0 33.5  RDW 15.3 15.7*  LYMPHSABS -- 0.8  MONOABS -- 0.9  EOSABS -- 0.4  BASOSABS -- 0.1  BANDABS -- --    Chemistries   Lab 08/07/12 0450 08/06/12 2150  NA 141 139  K 3.1* 3.2*  CL 107 103  CO2 26 26  GLUCOSE 94 149*  BUN 39* 43*  CREATININE 1.70* 1.72*  CALCIUM 8.3* 8.4  MG -- --  AST -- 43*  ALT -- 42*  ALKPHOS -- 502*  BILITOT -- 1.2   ------------------------------------------------------------------------------------------------------------------ estimated creatinine clearance is 16.6 ml/min (by C-G formula based on Cr of 1.7). ------------------------------------------------------------------------------------------------------------------ No results found for this basename: HGBA1C:2 in the last 72 hours ------------------------------------------------------------------------------------------------------------------ No results found for  this basename: CHOL:2,HDL:2,LDLCALC:2,TRIG:2,CHOLHDL:2,LDLDIRECT:2 in the last 72 hours ------------------------------------------------------------------------------------------------------------------ No results found for this basename: TSH,T4TOTAL,FREET3,T3FREE,THYROIDAB in the last 72 hours ------------------------------------------------------------------------------------------------------------------ No results found for this basename: VITAMINB12:2,FOLATE:2,FERRITIN:2,TIBC:2,IRON:2,RETICCTPCT:2 in the last 72 hours  Coagulation profile No results found for this basename: INR:5,PROTIME:5 in the last 168 hours  No results found for this basename: DDIMER:2 in the last 72 hours  Cardiac Enzymes No results found for this basename: CK:3,CKMB:3,TROPONINI:3,MYOGLOBIN:3 in the last 168 hours ------------------------------------------------------------------------------------------------------------------ No components found with this basename: POCBNP:3

## 2012-08-07 NOTE — Evaluation (Signed)
Physical Therapy Evaluation Patient Details Name: [name] [name] MRN: [id number] DOB: 04/28/1919 Today's Date: 08/07/2012 Time: 4098-1191 PT Time Calculation (min): 16 min  PT Assessment / Plan / Recommendation Clinical Impression  Pt was admitted from ALF w/ frequent falls. Pt. pt. was unable to sit at side of bed today w/out total assistance pt. will benefit from trial  PT. to improve in functional mobility to DC to SNF.    PT Assessment  Patient needs continued PT services    Follow Up Recommendations  Skilled nursing facility    Barriers to Discharge        Equipment Recommendations  None recommended by PT    Recommendations for Other Services     Frequency Min 3X/week    Precautions / Restrictions Precautions Precautions: Fall Precaution Comments: has sacral decubitus.   Pertinent Vitals/Pain No c/o      Mobility  Bed Mobility Bed Mobility: Supine to Sit;Sit to Supine Supine to Sit: 1: +1 Total assist Sit to Supine: 1: +1 Total assist Details for Bed Mobility Assistance: Pt did not assist w/ mobility. pt's trunk and legs extended  . hjad to return to supine due to slinding to edge of bed.    Exercises     PT Diagnosis: Difficulty walking;Generalized weakness  PT Problem List: Decreased strength;Decreased range of motion;Decreased activity tolerance;Decreased balance;Decreased mobility;Decreased cognition;Impaired tone PT Treatment Interventions: Functional mobility training;Balance training;DME instruction;Gait training;Patient/family education;Therapeutic activities   PT Goals Acute Rehab PT Goals PT Goal Formulation: Patient unable to participate in goal setting Time For Goal Achievement: 08/21/12 Potential to Achieve Goals: Fair Pt will Roll Supine to Right Side: with min assist PT Goal: Rolling Supine to Right Side - Progress: Goal set today Pt will Roll Supine to Left Side: with min assist PT Goal: Rolling Supine to Left Side - Progress: Goal set  today Pt will Sit at Edge of Bed: 3-5 min;with min assist;with bilateral upper extremity support PT Goal: Sit at Edge Of Bed - Progress: Goal set today Pt will go Sit to Supine/Side: with min assist PT Goal: Sit to Supine/Side - Progress: Goal set today Pt will Transfer Bed to Chair/Chair to Bed: with mod assist PT Transfer Goal: Bed to Chair/Chair to Bed - Progress: Goal set today Pt will Ambulate: 16 - 50 feet;with +2 total assist;with rolling walker (pt=60%)  Visit Information  Last PT Received On: 08/07/12 Assistance Needed: +2    Subjective Data  Subjective: No socks Patient Stated Goal: pt unable , no family present   Prior Functioning  Home Living Available Help at Discharge: Skilled Nursing Facility Type of Home: Assisted living Home Adaptive Equipment: Walker - rolling Prior Function Level of Independence: Needs assistance Needs Assistance: Transfers;Gait;Bathing Gait Assistance: uses RW, per chart. Communication Communication: HOH (difficult to understand pt.)    Cognition  Overall Cognitive Status: No family/caregiver present to determine baseline cognitive functioning Difficult to assess due to: Hard of hearing/deaf Arousal/Alertness: Lethargic Cognition - Other Comments: difficult to understand pt's speech.     Extremity/Trunk Assessment Right Lower Extremity Assessment RLE ROM/Strength/Tone: Deficits RLE ROM/Strength/Tone Deficits: pt keeps knees extended. plantarflexed.did not actively move LE. Left Lower Extremity Assessment LLE ROM/Strength/Tone: Deficits LLE ROM/Strength/Tone Deficits: same as RLE Trunk Assessment Trunk Assessment: Kyphotic   Balance Static Sitting Balance Static Sitting - Level of Assistance: 1: +1 Total assist Static Sitting - Comment/# of Minutes: pt is not able to sit up, extends trunk and legs  End of Session PT - End of Session Activity  Tolerance: Other (comment) (pt kept repeating, no.no.) Patient left: in bed;with call  bell/phone within reach Nurse Communication: Mobility status  GP     Rada Hay 08/07/2012, 4:47 PM  [phone]

## 2012-08-07 NOTE — Progress Notes (Signed)
Clinical Social Work Department CLINICAL SOCIAL WORK PLACEMENT NOTE 08/07/2012  Patient:  [name] [name],[name] [name]  Account Number:  [id number] Admit date:  08/06/2012  Clinical Social Worker:  Doroteo Glassman  Date/time:  08/07/2012 02:48 PM  Clinical Social Work is seeking post-discharge placement for this patient at the following level of care:   SKILLED NURSING   (*CSW will update this form in Epic as items are completed)   08/07/2012  Patient/family provided with [facility] Department of Clinical Social Work's list of facilities offering this level of care within the geographic area requested by the patient (or if unable, by the patient's family).  08/07/2012  Patient/family informed of their freedom to choose among providers that offer the needed level of care, that participate in Medicare, Medicaid or managed care program needed by the patient, have an available bed and are willing to accept the patient.  08/07/2012  Patient/family informed of [facility]' ownership interest in [facility], as well as of the fact that they are under no obligation to receive care at this facility.  PASARR submitted to EDS on existing  PASARR number received from EDS on   FL2 transmitted to all facilities in geographic area requested by pt/family on  08/07/2012 FL2 transmitted to all facilities within larger geographic area on   Patient informed that his/her managed care company has contracts with or will negotiate with  certain facilities, including the following:     Patient/family informed of bed offers received:   Patient chooses bed at  Physician recommends and patient chooses bed at    Patient to be transferred to  on   Patient to be transferred to facility by   The following physician request were entered in Epic:   Additional Comments:  CSW to continue to follow.  Providence Crosby, LCSWA Clinical Social Work [phone]

## 2012-08-08 ENCOUNTER — Ambulatory Visit: Payer: Self-pay | Admitting: Internal Medicine

## 2012-08-08 DIAGNOSIS — T148XXA Other injury of unspecified body region, initial encounter: Secondary | ICD-10-CM

## 2012-08-08 DIAGNOSIS — K5900 Constipation, unspecified: Secondary | ICD-10-CM

## 2012-08-08 LAB — BASIC METABOLIC PANEL
BUN: 27 mg/dL — ABNORMAL HIGH (ref 6–23)
CO2: 22 mEq/L (ref 19–32)
Chloride: 111 mEq/L (ref 96–112)
Creatinine, Ser: 1.31 mg/dL — ABNORMAL HIGH (ref 0.50–1.10)
Glucose, Bld: 95 mg/dL (ref 70–99)

## 2012-08-08 LAB — CBC
HCT: 27.7 % — ABNORMAL LOW (ref 36.0–46.0)
Hemoglobin: 9.6 g/dL — ABNORMAL LOW (ref 12.0–15.0)
MCHC: 34.7 g/dL (ref 30.0–36.0)
MCV: 92.6 fL (ref 78.0–100.0)

## 2012-08-08 LAB — MAGNESIUM: Magnesium: 2.3 mg/dL (ref 1.5–2.5)

## 2012-08-08 NOTE — Progress Notes (Signed)
[facility]                                                                                Patient Demographics  [name] [name], is a 78 y.o. female  ZOX:[id number]  [id number]:[id number]  DOB - 1919-04-20  Admit date - 08/06/2012  Admitting Physician Gery Pray, MD  Outpatient Primary MD for the patient is Letitia Libra, Ala Dach, MD  LOS - 2   Chief Complaint  Patient presents with  . Fall        Assessment & Plan    1. Multiple Mechanical Falls - she will be seen by physical therapy evaluated for balance and needs for walking aids, she needs placement. CT scan of the brain and spine x-ray of the hips stable.    2. History of Hypothyroidism - home dose Synthroid to be continued , stable baseline TSH.   Lab Results  Component Value Date   TSH 1.881 08/07/2012    3. Depression with anxiety, RLS - new home medications no acute issues   4. HTN - stable on Lopressor we'll continue.   5.Hypokalemia - replace and recheck with magnesium levels later.   6. ARF - due to dehydration, post IV fluids for hydration hold any offending medications at this time, much improved.    7. UTI (lower urinary tract infection) - empiric IV Rocephin.   8. Decubitus ulcer of sacral region, unstageable - with minimal surrounding cellulitis no discharge no signs of active infection we'll withhold any prolonged antibiotics. She is afebrile with no leukocytosis.   9. Anemia of chronic disease no acute issues we'll monitor expect to fall further with dilution after IV fluids.    Code Status: DO NOT RESUSCITATE  Family Communication: Discussed with the patient  Disposition Plan: To rehabilitation    Procedures none   Consults  wound care, PT   Time Spent in minutes  35   Antibiotics    Anti-infectives     Start     Dose/Rate Route Frequency Ordered Stop   08/08/12 2000   vancomycin (VANCOCIN) IVPB 1000 mg/200 mL premix  Status:  Discontinued        1,000 mg 200 mL/hr over 60 Minutes Intravenous Every 48 hours 08/07/12 0028 08/07/12 1101   08/06/12 2359   cefTRIAXone (ROCEPHIN) 1 g in dextrose 5 % 50 mL IVPB        1 g 100 mL/hr over 30 Minutes Intravenous Every 24 hours 08/06/12 2301     08/06/12 2030   vancomycin (VANCOCIN) IVPB 1000 mg/200 mL premix        1,000 mg 200 mL/hr over 60 Minutes Intravenous  Once 08/06/12 2000 08/06/12 2146   08/06/12 2000  piperacillin-tazobactam (ZOSYN) IVPB 3.375 g       3.375 g 100 mL/hr over 30 Minutes Intravenous  Once 08/06/12 2000 08/06/12 2224          Scheduled Meds:    . atenolol  50 mg Oral Daily  . bisacodyl  5 mg Oral Daily  . cefTRIAXone (ROCEPHIN)  IV  1 g Intravenous Q24H  . collagenase   Topical Daily  . enoxaparin (LOVENOX) injection  30 mg  Subcutaneous Q24H  . feeding supplement  237 mL Oral TID BM  . fluticasone  2 spray Each Nare Daily  . gabapentin  300 mg Oral QHS  . levothyroxine  88 mcg Oral Daily  . magnesium oxide  400 mg Oral Daily  . pantoprazole  40 mg Oral Q1200  . rOPINIRole  1 mg Oral Q12H  . rOPINIRole  2 mg Oral Q12H  . sertraline  100 mg Oral Daily  . vitamin B-12  1,000 mcg Oral Daily  . DISCONTD: vancomycin  1,000 mg Intravenous Q48H   Continuous Infusions:    . 0.9 % NaCl with KCl 20 mEq / L 75 mL/hr at 08/07/12 0853   PRN Meds:.acetaminophen, alum & mag hydroxide-simeth, bisacodyl, cloNIDine, HYDROcodone-acetaminophen, ondansetron (ZOFRAN) IV, ondansetron   DVT Prophylaxis  Lovenox     Leroy Sea M.D on 08/08/2012 at 9:20 AM  Between 7am to 7pm - Pager - [phone]  After 7pm go to [email or web address] - password [email or web address]  And look for the night coverage person covering for me after hours  [facility] Office  [phone]    Subjective:   Jamilex Bohnsack today has, No headache, No chest pain, No abdominal pain - No Nausea, No new weakness tingling or numbness, No Cough - SOB.    Objective:   Filed Vitals:    08/07/12 1407 08/07/12 1645 08/07/12 2128 08/08/12 0551  BP: 174/65 190/66 182/70 168/57  Pulse: 56 56 59 60  Temp: 97.8 F (36.6 C) 97.7 F (36.5 C) 98.1 F (36.7 C) 98 F (36.7 C)  TempSrc: Oral Oral Oral Oral  Resp: 20 18 17 17   Height:      Weight:      SpO2: 96% 96% 97% 94%    Wt Readings from Last 3 Encounters:  08/06/12 52.7 kg (116 lb 2.9 oz)  06/17/12 52.164 kg (115 lb)  05/02/12 54.432 kg (120 lb)     Intake/Output Summary (Last 24 hours) at 08/08/12 0920 Last data filed at 08/08/12 5284  Gross per 24 hour  Intake 1503.75 ml  Output    900 ml  Net 603.75 ml    Exam Awake Alert, Oriented X 3, No new F.N deficits, Normal affect [place].AT,PERRAL Supple Neck,No JVD, No cervical lymphadenopathy appriciated.  Symmetrical Chest wall movement, Good air movement bilaterally, CTAB RRR,No Gallops,Rubs or new Murmurs, No Parasternal Heave +ve B.Sounds, Abd Soft, Non tender, No organomegaly appriciated, No rebound - guarding or rigidity. No Cyanosis, Clubbing or edema, No new Rash or bruise , necrotic unstageable sacral decubitus ulcer of about 1 cm in diameter noted.   Data Review   Micro Results Recent Results (from the past 240 hour(s))  MRSA PCR SCREENING     Status: Normal   Collection Time   08/07/12 12:00 AM      Component Value Range Status Comment   MRSA by PCR NEGATIVE  NEGATIVE Final     Radiology Reports Dg Pelvis 1-2 Views  08/06/2012  *RADIOLOGY REPORT*  Clinical Data: Fall, pain.  PELVIS - 1-2 VIEW  Comparison: CT of the pelvis 04/07/2012.  Findings: The hips are located.  No fracture is identified.  No notable degenerative change about the hips is seen.  Soft tissue structures are unremarkable. Bones appear osteopenic.  IMPRESSION: No acute finding.   Original Report Authenticated By: Bernadene Bell. Maricela Curet, M.D.    Ct Head Wo Contrast  08/06/2012  *RADIOLOGY REPORT*  Clinical Data:  Unwitnessed fall.  The patient was found lying on  her side.  CT HEAD  WITHOUT CONTRAST CT CERVICAL SPINE WITHOUT CONTRAST  Technique:  Multidetector CT imaging of the head and cervical spine was performed following the standard protocol without intravenous contrast.  Multiplanar CT image reconstructions of the cervical spine were also generated.  Comparison:  CT head and cervical spine 08/04/2012.  CT HEAD  Findings: No acute cortical infarct, hemorrhage, or mass lesion is present.  Moderate generalized atrophy and diffuse white matter disease is similar to the prior study.  The ventricles are proportionate to the degree of atrophy.  No significant extra-axial fluid collection is present.  The paranasal sinuses and mastoid air cells are clear.  No significant extracranial soft tissue injury is evident.  The osseous skull is intact.  Atherosclerotic calcifications are again seen.  IMPRESSION:  1.  No acute intracranial abnormality or significant interval change. 2.  Stable atrophy and diffuse white matter disease. 3.  Atherosclerosis.  CT CERVICAL SPINE  Findings: The cervical spine is imaged from skull base through T2- 3.  Slight degenerative anterolisthesis at C4-5 and C5-6 is stable. No acute fracture or traumatic subluxation is evident.  The soft tissues are unremarkable.  Biapical pleural parenchymal scarring is present.  IMPRESSION:  1.  Stable degenerative changes of the cervical spine. 2.  No acute fracture or traumatic subluxation.   Original Report Authenticated By: Jamesetta Orleans. MATTERN, M.D.    Ct Head Wo Contrast  08/04/2012  *RADIOLOGY REPORT*  Clinical Data:  Fall from chair, striking her head.  CT HEAD WITHOUT CONTRAST CT CERVICAL SPINE WITHOUT CONTRAST  Technique:  Multidetector CT imaging of the head and cervical spine was performed following the standard protocol without intravenous contrast.  Multiplanar CT image reconstructions of the cervical spine were also generated.  Comparison:  CT head and cervical spine without contrast 01/17/2012.  CT HEAD  Findings:  Mild atrophy and periventricular white matter hypoattenuation is similar to the prior exam.  No acute cortical infarct, hemorrhage, or mass lesion is present.  Vascular calcifications are again noted within the cavernous carotid arteries bilaterally.  The visualized paranasal sinuses and mastoid air cells are clear. The osseous skull is intact.  IMPRESSION:  1.  No acute intracranial abnormality or significant interval change. 2.  Stable atrophy and diffuse white matter disease.  CT CERVICAL SPINE  Findings: Cervical spine is imaged from skull base through T9. Exaggerated cervical kyphosis is more pronounced than on the prior exam.  The vertebral body heights and alignment are maintained. Mild endplate degenerative changes are again seen.  Endplate osteophyte formation is most pronounced at C4-5, as before.  The soft tissues are unremarkable.  The lung apices demonstrate mild dependent atelectasis bilaterally.  Coronary artery calcifications are present.  The heart is enlarged.  IMPRESSION:  1.  Exaggerated cervical thoracic kyphosis. 2.  No acute abnormality. 3.  Stable degenerative changes. 4.  Cardiomegaly with evidence for coronary artery disease.   Original Report Authenticated By: Jamesetta Orleans. MATTERN, M.D.    Ct Cervical Spine Wo Contrast  08/06/2012  *RADIOLOGY REPORT*  Clinical Data:  Unwitnessed fall.  The patient was found lying on her side.  CT HEAD WITHOUT CONTRAST CT CERVICAL SPINE WITHOUT CONTRAST  Technique:  Multidetector CT imaging of the head and cervical spine was performed following the standard protocol without intravenous contrast.  Multiplanar CT image reconstructions of the cervical spine were also generated.  Comparison:  CT head and cervical spine 08/04/2012.  CT HEAD  Findings: No acute cortical infarct, hemorrhage,  or mass lesion is present.  Moderate generalized atrophy and diffuse white matter disease is similar to the prior study.  The ventricles are proportionate to the degree  of atrophy.  No significant extra-axial fluid collection is present.  The paranasal sinuses and mastoid air cells are clear.  No significant extracranial soft tissue injury is evident.  The osseous skull is intact.  Atherosclerotic calcifications are again seen.  IMPRESSION:  1.  No acute intracranial abnormality or significant interval change. 2.  Stable atrophy and diffuse white matter disease. 3.  Atherosclerosis.  CT CERVICAL SPINE  Findings: The cervical spine is imaged from skull base through T2- 3.  Slight degenerative anterolisthesis at C4-5 and C5-6 is stable. No acute fracture or traumatic subluxation is evident.  The soft tissues are unremarkable.  Biapical pleural parenchymal scarring is present.  IMPRESSION:  1.  Stable degenerative changes of the cervical spine. 2.  No acute fracture or traumatic subluxation.   Original Report Authenticated By: Jamesetta Orleans. MATTERN, M.D.    Ct Cervical Spine Wo Contrast  08/04/2012  *RADIOLOGY REPORT*  Clinical Data:  Fall from chair, striking her head.  CT HEAD WITHOUT CONTRAST CT CERVICAL SPINE WITHOUT CONTRAST  Technique:  Multidetector CT imaging of the head and cervical spine was performed following the standard protocol without intravenous contrast.  Multiplanar CT image reconstructions of the cervical spine were also generated.  Comparison:  CT head and cervical spine without contrast 01/17/2012.  CT HEAD  Findings: Mild atrophy and periventricular white matter hypoattenuation is similar to the prior exam.  No acute cortical infarct, hemorrhage, or mass lesion is present.  Vascular calcifications are again noted within the cavernous carotid arteries bilaterally.  The visualized paranasal sinuses and mastoid air cells are clear. The osseous skull is intact.  IMPRESSION:  1.  No acute intracranial abnormality or significant interval change. 2.  Stable atrophy and diffuse white matter disease.  CT CERVICAL SPINE  Findings: Cervical spine is imaged from skull  base through T9. Exaggerated cervical kyphosis is more pronounced than on the prior exam.  The vertebral body heights and alignment are maintained. Mild endplate degenerative changes are again seen.  Endplate osteophyte formation is most pronounced at C4-5, as before.  The soft tissues are unremarkable.  The lung apices demonstrate mild dependent atelectasis bilaterally.  Coronary artery calcifications are present.  The heart is enlarged.  IMPRESSION:  1.  Exaggerated cervical thoracic kyphosis. 2.  No acute abnormality. 3.  Stable degenerative changes. 4.  Cardiomegaly with evidence for coronary artery disease.   Original Report Authenticated By: Jamesetta Orleans. MATTERN, M.D.     CBC  Lab 08/08/12 0450 08/07/12 0450 08/06/12 1935  WBC 9.7 10.2 11.8*  HGB 9.6* 9.1* 11.1*  HCT 27.7* 26.8* 33.1*  PLT 260 243 207  MCV 92.6 92.7 92.7  MCH 32.1 31.5 31.1  MCHC 34.7 34.0 33.5  RDW 15.0 15.3 15.7*  LYMPHSABS -- -- 0.8  MONOABS -- -- 0.9  EOSABS -- -- 0.4  BASOSABS -- -- 0.1  BANDABS -- -- --    Chemistries   Lab 08/08/12 0450 08/07/12 0450 08/06/12 2150  NA 142 141 139  K 3.6 3.1* 3.2*  CL 111 107 103  CO2 22 26 26   GLUCOSE 95 94 149*  BUN 27* 39* 43*  CREATININE 1.31* 1.70* 1.72*  CALCIUM 8.7 8.3* 8.4  MG 2.3 -- --  AST -- -- 43*  ALT -- -- 42*  ALKPHOS -- -- 502*  BILITOT -- --  1.2   ------------------------------------------------------------------------------------------------------------------ estimated creatinine clearance is 21.5 ml/min (by C-G formula based on Cr of 1.31). ------------------------------------------------------------------------------------------------------------------ No results found for this basename: HGBA1C:2 in the last 72 hours ------------------------------------------------------------------------------------------------------------------ No results found for this basename: CHOL:2,HDL:2,LDLCALC:2,TRIG:2,CHOLHDL:2,LDLDIRECT:2 in the last 72  hours ------------------------------------------------------------------------------------------------------------------  [facility] 08/07/12 1228  TSH 1.881  T4TOTAL --  T3FREE --  THYROIDAB --   ------------------------------------------------------------------------------------------------------------------ No results found for this basename: VITAMINB12:2,FOLATE:2,FERRITIN:2,TIBC:2,IRON:2,RETICCTPCT:2 in the last 72 hours  Coagulation profile No results found for this basename: INR:5,PROTIME:5 in the last 168 hours  No results found for this basename: DDIMER:2 in the last 72 hours  Cardiac Enzymes No results found for this basename: CK:3,CKMB:3,TROPONINI:3,MYOGLOBIN:3 in the last 168 hours ------------------------------------------------------------------------------------------------------------------ No components found with this basename: POCBNP:3

## 2012-08-08 NOTE — Evaluation (Signed)
Occupational Therapy Evaluation Patient Details Name: [name] [name] MRN: [id number] DOB: 1919-05-08 Today's Date: 08/08/2012 Time: 1152-1208 OT Time Calculation (min): 16 min  OT Assessment / Plan / Recommendation Clinical Impression  This 78 year old female is from an ALF and used a walker prior to hospitalization.  Amount of assist with ADLs is unknown.  Pt has a new decub on sacrum.  Will trial OT to see if pt will benefit from this.      OT Assessment  Patient needs continued OT Services (trial of OT)    Follow Up Recommendations  Skilled nursing facility    Barriers to Discharge      Equipment Recommendations  None recommended by OT;None recommended by PT    Recommendations for Other Services    Frequency  Min 1X/week    Precautions / Restrictions Precautions Precautions: Fall Precaution Comments: has sacral decubitus. Restrictions Weight Bearing Restrictions: No   Pertinent Vitals/Pain No c/o pain    ADL  Eating/Feeding: Simulated;Minimal assistance Where Assessed - Eating/Feeding: Bed level Grooming: Performed;Wash/dry hands;Wash/dry face;Set up Where Assessed - Grooming: Supine, head of bed up Upper Body Bathing: Simulated;Maximal assistance Where Assessed - Upper Body Bathing: Supine, head of bed up Lower Body Bathing: Simulated;+2 Total assistance Lower Body Bathing: Patient Percentage: 0% Where Assessed - Lower Body Bathing: Rolling right and/or left Upper Body Dressing: Simulated;Maximal assistance Where Assessed - Upper Body Dressing: Supine, head of bed up Lower Body Dressing: Simulated;+2 Total assistance Lower Body Dressing: Patient Percentage: 0% Where Assessed - Lower Body Dressing: Rolling right and/or left Toileting - Clothing Manipulation and Hygiene: Simulated;+2 Total assistance Toileting - Clothing Manipulation and Hygiene: Patient Percentage: 0% Transfers/Ambulation Related to ADLs: rolled to both sides with max A using bedrails  Needs A x  2 to maintain sidelying ADL Comments: Pt was on bedpan when I arrived.  Removed and cleaned pt    OT Diagnosis: Generalized weakness  OT Problem List: Decreased strength;Decreased activity tolerance;Impaired balance (sitting and/or standing);Impaired vision/perception;Decreased knowledge of use of DME or AE OT Treatment Interventions: Self-care/ADL training;Therapeutic exercise;Therapeutic activities;Balance training;Patient/family education   OT Goals Acute Rehab OT Goals OT Goal Formulation: With patient Time For Goal Achievement: 08/22/12 Potential to Achieve Goals: Fair ADL Goals Pt Will Perform Grooming: with min assist;Supported;Sitting, chair (2 tasks) ADL Goal: Grooming - Progress: Goal set today Pt Will Perform Upper Body Bathing: with min assist;Sitting, chair;Supported ADL Goal: Upper Body Bathing - Progress: Goal set today Pt Will Perform Upper Body Dressing: with min assist;Sitting, chair;Supported ADL Goal: Location manager Dressing - Progress: Goal set today Pt Will Transfer to Toilet: with 2+ total assist;3-in-1;Stand pivot transfer (pt 50%) ADL Goal: Toilet Transfer - Progress: Goal set today Miscellaneous OT Goals Miscellaneous OT Goal #1: Pt will perform 10 reps of aarom to increase strength endurance for adls without rest break OT Goal: Miscellaneous Goal #1 - Progress: Goal set today  Visit Information  Last OT Received On: 08/08/12 Assistance Needed: +2    Subjective Data  Subjective: "I need to get cleaned up" Patient Stated Goal: none stated   Prior Functioning  Vision/Perception  Home Living Lives With:  (from alf) Available Help at Discharge: Skilled Nursing Facility Prior Function Level of Independence: Needs assistance Communication Communication: HOH (? if pt speaks in 2 languages)      Cognition  Overall Cognitive Status: Difficult to assess Difficult to assess due to: Hard of hearing/deaf (language) Arousal/Alertness: Awake/alert Orientation  Level: Disoriented to;Place;Time;Situation    Extremity/Trunk Assessment Right Upper  Extremity Assessment RUE ROM/Strength/Tone: Deficits RUE ROM/Strength/Tone Deficits: can lift arm about 60 actively;  aarom to 90 Left Upper Extremity Assessment LUE ROM/Strength/Tone: Deficits LUE ROM/Strength/Tone Deficits: lifted LUE about 30; 90 for aarom shoulder   Mobility  Shoulder Instructions  Bed Mobility Rolling Right: 2: Max assist Rolling Left: 2: Max assist       Exercise     Balance     End of Session OT - End of Session Activity Tolerance: Patient limited by fatigue Patient left: in bed;with call bell/phone within reach  GO     Olie Dibert 08/08/2012, 1:20 PM Marica Otter, OTR/L [phone] 08/08/2012

## 2012-08-08 NOTE — Progress Notes (Signed)
Pt sat up in chair for a little more than an hour, per Md request.  Placed pt back to bed.

## 2012-08-08 NOTE — Evaluation (Signed)
Clinical/Bedside Swallow Evaluation Patient Details  Name: [name] [name] MRN: [id number] Date of Birth: November 19, 1919  Today's Date: 08/08/2012 Time: 3086-5784 SLP Time Calculation (min): 14 min  Past Medical History:  Past Medical History  Diagnosis Date  . Arthritis   . History of chicken pox   . Depression     husband died 09-13-11  . Glaucoma   . Thyroid disease   . Hypertension   . Hypokalemia   . Renal disorder   . Altered mental state    Past Surgical History:  Past Surgical History  Procedure Date  . Revision total knee arthroplasty     right knee  . Back surgery   . Cataract extraction, bilateral   . Thyroidectomy   . Abdominal hysterectomy    HPI:  78 yo female adm to [facility] with AMS, frequent falls.  Pt PMH + for arthitis, depression, renal disorder, glaucoma.   CT Head showed atrophy.  She has cervical-thoracic kyphosis.  Pt intake is poor 20% documented for today.     Assessment / Plan / Recommendation Clinical Impression  Pt presents with functional swallow for 78 years of age, cervical thoracic kyphosis with resultant poor positioning in bed- exacerbated by her decubitus ulcer causing pain when head of bed raised.  Observed with minimal intake as she declined more po - honey dew x1, water x5 boluses and ensure x3 boluses.  No clinical indicators of overt aspiration.  Mild delay in oral transit and mastication- no oral stasis.   Rec continue current diet with strict adherence to precautions to maximize pt's airway protection.  No further SLP indicated.  Thanks for this referral.       Aspiration Risk  Moderate (primarily due to weakness and positioning)    Diet Recommendation Regular;Thin liquid   Liquid Administration via: Straw;Cup Medication Administration: Whole meds with puree Supervision: Staff feed patient Compensations: Slow rate;Small sips/bites Postural Changes and/or Swallow Maneuvers: Upright 30-60 min after meal (as upright as able)    Other   Recommendations Oral Care Recommendations: Oral care BID   Follow Up Recommendations  None    Frequency and Duration        Pertinent Vitals/Pain Afebrile, decreased    SLP Swallow Goals   Afebrile, decreased  Swallow Study Prior Functional Status       General Date of Onset: 08/01/12 HPI: 78 yo female adm to [facility] with AMS, frequent falls.  Pt PMH + for arthitis, depression, renal disorder, glaucoma.   CT Head showed atrophy.  She has cervical-thoracic kyphosis.  Pt intake is poor 20% documented for today.   Type of Study: Bedside swallow evaluation Diet Prior to this Study: Regular;Thin liquids Respiratory Status: Room air Behavior/Cognition: Alert;Pleasant mood Oral Cavity - Dentition: Adequate natural dentition Self-Feeding Abilities: Total assist Patient Positioning: Postural control interferes with function (pt in air bed with limited ability to sit upright) Baseline Vocal Quality: Clear Volitional Cough: Weak Volitional Swallow: Unable to elicit    Oral/Motor/Sensory Function Overall Oral Motor/Sensory Function: Impaired (generalized weakness)   Ice Chips Ice chips: Not tested   Thin Liquid Thin Liquid: Within functional limits Presentation: Straw Other Comments: throat clearing x1 after four sequential swallows of thin water, tolerated single boluses via straw    Nectar Thick Nectar Thick Liquid: Impaired Presentation: Straw Oral Phase Impairments: Reduced labial seal;Reduced lingual movement/coordination Pharyngeal Phase Impairments: Suspected delayed Swallow Other Comments: Ensure - (near nectar)   Honey Thick Honey Thick Liquid: Not tested   Puree Puree: Not  tested   Solid   GO    Solid: Within functional limits Other Comments: honeydew x1 bite- pt declined further intake       Donavan Burnet, MS [facility] SLP [phone]

## 2012-08-09 LAB — URINE CULTURE: Colony Count: >=100000

## 2012-08-09 NOTE — Progress Notes (Signed)
[facility]                                                                                Patient Demographics  [name] [name], is a 78 y.o. female  ZOX:[id number]  [id number]  DOB - February 04, 1919  Admit date - 08/06/2012  Admitting Physician Gery Pray, MD  Outpatient Primary MD for the patient is Letitia Libra, Ala Dach, MD  LOS - 3   Chief Complaint  Patient presents with  . Fall        Assessment & Plan    1. Multiple Mechanical Falls - she will be seen by physical therapy evaluated for balance and needs for walking aids, she needs placement. CT scan of the brain and spine x-ray of the hips stable.    2. History of Hypothyroidism - home dose Synthroid to be continued , stable baseline TSH.   Lab Results  Component Value Date   TSH 1.881 08/07/2012    3. Depression with anxiety, RLS - continue home medications no acute issues   4. HTN - stable on Lopressor we'll continue.   5.Hypokalemia - replaced and stable.   6. ARF - due to dehydration, post IV fluids for hydration hold any offending medications at this time, much improved.    7. UTI (lower urinary tract infection) - empiric IV Rocephin. Follow sensitivities, clinically  Better.   8. Decubitus ulcer of sacral region, unstageable - with minimal surrounding cellulitis no discharge no signs of active infection we'll withhold any prolonged antibiotics. She is afebrile with no leukocytosis.   9. Anemia of chronic disease no acute issues we'll monitor expect to fall further with dilution after IV fluids.    Code Status: DO NOT RESUSCITATE  Family Communication: Discussed with the patient  Disposition Plan: To rehabilitation    Procedures none   Consults  wound care, PT   Time Spent in minutes  35   Antibiotics    Anti-infectives     Start     Dose/Rate Route Frequency Ordered Stop   08/08/12 2000   vancomycin (VANCOCIN) IVPB 1000 mg/200 mL premix  Status:   Discontinued        1,000 mg 200 mL/hr over 60 Minutes Intravenous Every 48 hours 08/07/12 0028 08/07/12 1101   08/06/12 2359   cefTRIAXone (ROCEPHIN) 1 g in dextrose 5 % 50 mL IVPB        1 g 100 mL/hr over 30 Minutes Intravenous Every 24 hours 08/06/12 2301     08/06/12 2030   vancomycin (VANCOCIN) IVPB 1000 mg/200 mL premix        1,000 mg 200 mL/hr over 60 Minutes Intravenous  Once 08/06/12 2000 08/06/12 2146   08/06/12 2000  piperacillin-tazobactam (ZOSYN) IVPB 3.375 g       3.375 g 100 mL/hr over 30 Minutes Intravenous  Once 08/06/12 2000 08/06/12 2224          Scheduled Meds:    . atenolol  50 mg Oral Daily  . bisacodyl  5 mg Oral Daily  . cefTRIAXone (ROCEPHIN)  IV  1 g Intravenous Q24H  . collagenase   Topical Daily  . enoxaparin (LOVENOX) injection  30 mg Subcutaneous Q24H  . feeding supplement  237 mL Oral TID BM  . fluticasone  2 spray Each Nare Daily  . gabapentin  300 mg Oral QHS  . levothyroxine  88 mcg Oral Daily  . magnesium oxide  400 mg Oral Daily  . pantoprazole  40 mg Oral Q1200  . rOPINIRole  1 mg Oral Q12H  . rOPINIRole  2 mg Oral Q12H  . sertraline  100 mg Oral Daily  . vitamin B-12  1,000 mcg Oral Daily   Continuous Infusions:   PRN Meds:.acetaminophen, alum & mag hydroxide-simeth, bisacodyl, cloNIDine, HYDROcodone-acetaminophen, ondansetron (ZOFRAN) IV, ondansetron   DVT Prophylaxis  Lovenox     Susa Raring K M.D on 08/09/2012 at 8:57 AM  Between 7am to 7pm - Pager - [phone]  After 7pm go to [email or web address] - password [email or web address]  And look for the night coverage person covering for me after hours  [facility] Office  [phone]    Subjective:   [name] [name] today has, No headache, No chest pain, No abdominal pain - No Nausea, No new weakness tingling or numbness, No Cough - SOB.    Objective:   Filed Vitals:   08/08/12 0551 08/08/12 2107 08/08/12 2239 08/09/12 0535  BP: 168/57 191/59 162/60 151/71  Pulse:  60 64  59  Temp: 98 F (36.7 C) 98.3 F (36.8 C)  98.4 F (36.9 C)  TempSrc: Oral Oral  Oral  Resp: 17 17  16   Height:      Weight:      SpO2: 94% 95%  96%    Wt Readings from Last 3 Encounters:  08/06/12 52.7 kg (116 lb 2.9 oz)  06/17/12 52.164 kg (115 lb)  05/02/12 54.432 kg (120 lb)     Intake/Output Summary (Last 24 hours) at 08/09/12 0857 Last data filed at 08/09/12 0500  Gross per 24 hour  Intake 1913.75 ml  Output    300 ml  Net 1613.75 ml    Exam Awake Alert, Oriented X 3, No new F.N deficits, Normal affect Nelson.AT,PERRAL Supple Neck,No JVD, No cervical lymphadenopathy appriciated.  Symmetrical Chest wall movement, Good air movement bilaterally, CTAB RRR,No Gallops,Rubs or new Murmurs, No Parasternal Heave +ve B.Sounds, Abd Soft, Non tender, No organomegaly appriciated, No rebound - guarding or rigidity. No Cyanosis, Clubbing or edema, No new Rash or bruise , necrotic unstageable sacral decubitus ulcer of about 1 cm in diameter noted.   Data Review   Micro Results Recent Results (from the past 240 hour(s))  MRSA PCR SCREENING     Status: Normal   Collection Time   08/07/12 12:00 AM      Component Value Range Status Comment   MRSA by PCR NEGATIVE  NEGATIVE Final   URINE CULTURE     Status: Normal (Preliminary result)   Collection Time   08/07/12 11:53 AM      Component Value Range Status Comment   Specimen Description URINE, CLEAN CATCH   Final    Special Requests rocephin   Final    Culture  Setup Time 08/07/2012 23:31   Final    Colony Count >=100,000 COLONIES/ML   Final    Culture ESCHERICHIA COLI   Final    Report Status PENDING   Incomplete     Radiology Reports Dg Pelvis 1-2 Views  08/06/2012  *RADIOLOGY REPORT*  Clinical Data: Fall, pain.  PELVIS - 1-2 VIEW  Comparison: CT of the pelvis 04/07/2012.  Findings: The hips are located.  No  fracture is identified.  No notable degenerative change about the hips is seen.  Soft tissue structures are  unremarkable. Bones appear osteopenic.  IMPRESSION: No acute finding.   Original Report Authenticated By: Bernadene Bell. Maricela Curet, M.D.    Ct Head Wo Contrast  08/06/2012  *RADIOLOGY REPORT*  Clinical Data:  Unwitnessed fall.  The patient was found lying on her side.  CT HEAD WITHOUT CONTRAST CT CERVICAL SPINE WITHOUT CONTRAST  Technique:  Multidetector CT imaging of the head and cervical spine was performed following the standard protocol without intravenous contrast.  Multiplanar CT image reconstructions of the cervical spine were also generated.  Comparison:  CT head and cervical spine 08/04/2012.  CT HEAD  Findings: No acute cortical infarct, hemorrhage, or mass lesion is present.  Moderate generalized atrophy and diffuse white matter disease is similar to the prior study.  The ventricles are proportionate to the degree of atrophy.  No significant extra-axial fluid collection is present.  The paranasal sinuses and mastoid air cells are clear.  No significant extracranial soft tissue injury is evident.  The osseous skull is intact.  Atherosclerotic calcifications are again seen.  IMPRESSION:  1.  No acute intracranial abnormality or significant interval change. 2.  Stable atrophy and diffuse white matter disease. 3.  Atherosclerosis.  CT CERVICAL SPINE  Findings: The cervical spine is imaged from skull base through T2- 3.  Slight degenerative anterolisthesis at C4-5 and C5-6 is stable. No acute fracture or traumatic subluxation is evident.  The soft tissues are unremarkable.  Biapical pleural parenchymal scarring is present.  IMPRESSION:  1.  Stable degenerative changes of the cervical spine. 2.  No acute fracture or traumatic subluxation.   Original Report Authenticated By: Jamesetta Orleans. MATTERN, M.D.    Ct Head Wo Contrast  08/04/2012  *RADIOLOGY REPORT*  Clinical Data:  Fall from chair, striking her head.  CT HEAD WITHOUT CONTRAST CT CERVICAL SPINE WITHOUT CONTRAST  Technique:  Multidetector CT imaging of  the head and cervical spine was performed following the standard protocol without intravenous contrast.  Multiplanar CT image reconstructions of the cervical spine were also generated.  Comparison:  CT head and cervical spine without contrast 01/17/2012.  CT HEAD  Findings: Mild atrophy and periventricular white matter hypoattenuation is similar to the prior exam.  No acute cortical infarct, hemorrhage, or mass lesion is present.  Vascular calcifications are again noted within the cavernous carotid arteries bilaterally.  The visualized paranasal sinuses and mastoid air cells are clear. The osseous skull is intact.  IMPRESSION:  1.  No acute intracranial abnormality or significant interval change. 2.  Stable atrophy and diffuse white matter disease.  CT CERVICAL SPINE  Findings: Cervical spine is imaged from skull base through T9. Exaggerated cervical kyphosis is more pronounced than on the prior exam.  The vertebral body heights and alignment are maintained. Mild endplate degenerative changes are again seen.  Endplate osteophyte formation is most pronounced at C4-5, as before.  The soft tissues are unremarkable.  The lung apices demonstrate mild dependent atelectasis bilaterally.  Coronary artery calcifications are present.  The heart is enlarged.  IMPRESSION:  1.  Exaggerated cervical thoracic kyphosis. 2.  No acute abnormality. 3.  Stable degenerative changes. 4.  Cardiomegaly with evidence for coronary artery disease.   Original Report Authenticated By: Jamesetta Orleans. MATTERN, M.D.    Ct Cervical Spine Wo Contrast  08/06/2012  *RADIOLOGY REPORT*  Clinical Data:  Unwitnessed fall.  The patient was found lying on her side.  CT HEAD WITHOUT CONTRAST CT CERVICAL SPINE WITHOUT CONTRAST  Technique:  Multidetector CT imaging of the head and cervical spine was performed following the standard protocol without intravenous contrast.  Multiplanar CT image reconstructions of the cervical spine were also generated.   Comparison:  CT head and cervical spine 08/04/2012.  CT HEAD  Findings: No acute cortical infarct, hemorrhage, or mass lesion is present.  Moderate generalized atrophy and diffuse white matter disease is similar to the prior study.  The ventricles are proportionate to the degree of atrophy.  No significant extra-axial fluid collection is present.  The paranasal sinuses and mastoid air cells are clear.  No significant extracranial soft tissue injury is evident.  The osseous skull is intact.  Atherosclerotic calcifications are again seen.  IMPRESSION:  1.  No acute intracranial abnormality or significant interval change. 2.  Stable atrophy and diffuse white matter disease. 3.  Atherosclerosis.  CT CERVICAL SPINE  Findings: The cervical spine is imaged from skull base through T2- 3.  Slight degenerative anterolisthesis at C4-5 and C5-6 is stable. No acute fracture or traumatic subluxation is evident.  The soft tissues are unremarkable.  Biapical pleural parenchymal scarring is present.  IMPRESSION:  1.  Stable degenerative changes of the cervical spine. 2.  No acute fracture or traumatic subluxation.   Original Report Authenticated By: Jamesetta Orleans. MATTERN, M.D.    Ct Cervical Spine Wo Contrast  08/04/2012  *RADIOLOGY REPORT*  Clinical Data:  Fall from chair, striking her head.  CT HEAD WITHOUT CONTRAST CT CERVICAL SPINE WITHOUT CONTRAST  Technique:  Multidetector CT imaging of the head and cervical spine was performed following the standard protocol without intravenous contrast.  Multiplanar CT image reconstructions of the cervical spine were also generated.  Comparison:  CT head and cervical spine without contrast 01/17/2012.  CT HEAD  Findings: Mild atrophy and periventricular white matter hypoattenuation is similar to the prior exam.  No acute cortical infarct, hemorrhage, or mass lesion is present.  Vascular calcifications are again noted within the cavernous carotid arteries bilaterally.  The visualized  paranasal sinuses and mastoid air cells are clear. The osseous skull is intact.  IMPRESSION:  1.  No acute intracranial abnormality or significant interval change. 2.  Stable atrophy and diffuse white matter disease.  CT CERVICAL SPINE  Findings: Cervical spine is imaged from skull base through T9. Exaggerated cervical kyphosis is more pronounced than on the prior exam.  The vertebral body heights and alignment are maintained. Mild endplate degenerative changes are again seen.  Endplate osteophyte formation is most pronounced at C4-5, as before.  The soft tissues are unremarkable.  The lung apices demonstrate mild dependent atelectasis bilaterally.  Coronary artery calcifications are present.  The heart is enlarged.  IMPRESSION:  1.  Exaggerated cervical thoracic kyphosis. 2.  No acute abnormality. 3.  Stable degenerative changes. 4.  Cardiomegaly with evidence for coronary artery disease.   Original Report Authenticated By: Jamesetta Orleans. MATTERN, M.D.     CBC  Lab 08/08/12 0450 08/07/12 0450 08/06/12 1935  WBC 9.7 10.2 11.8*  HGB 9.6* 9.1* 11.1*  HCT 27.7* 26.8* 33.1*  PLT 260 243 207  MCV 92.6 92.7 92.7  MCH 32.1 31.5 31.1  MCHC 34.7 34.0 33.5  RDW 15.0 15.3 15.7*  LYMPHSABS -- -- 0.8  MONOABS -- -- 0.9  EOSABS -- -- 0.4  BASOSABS -- -- 0.1  BANDABS -- -- --    Chemistries   Lab 08/08/12 0450 08/07/12 0450 08/06/12 2150  NA 142 141 139  K 3.6 3.1* 3.2*  CL 111 107 103  CO2 22 26 26   GLUCOSE 95 94 149*  BUN 27* 39* 43*  CREATININE 1.31* 1.70* 1.72*  CALCIUM 8.7 8.3* 8.4  MG 2.3 -- --  AST -- -- 43*  ALT -- -- 42*  ALKPHOS -- -- 502*  BILITOT -- -- 1.2   ------------------------------------------------------------------------------------------------------------------ estimated creatinine clearance is 21.5 ml/min (by C-G formula based on Cr of 1.31). ------------------------------------------------------------------------------------------------------------------ No results  found for this basename: HGBA1C:2 in the last 72 hours ------------------------------------------------------------------------------------------------------------------ No results found for this basename: CHOL:2,HDL:2,LDLCALC:2,TRIG:2,CHOLHDL:2,LDLDIRECT:2 in the last 72 hours ------------------------------------------------------------------------------------------------------------------  Basename 08/07/12 1228  TSH 1.881  T4TOTAL --  T3FREE --  THYROIDAB --   ------------------------------------------------------------------------------------------------------------------ No results found for this basename: VITAMINB12:2,FOLATE:2,FERRITIN:2,TIBC:2,IRON:2,RETICCTPCT:2 in the last 72 hours  Coagulation profile No results found for this basename: INR:5,PROTIME:5 in the last 168 hours  No results found for this basename: DDIMER:2 in the last 72 hours  Cardiac Enzymes No results found for this basename: CK:3,CKMB:3,TROPONINI:3,MYOGLOBIN:3 in the last 168 hours ------------------------------------------------------------------------------------------------------------------ No components found with this basename: POCBNP:3

## 2012-08-10 MED ORDER — AMLODIPINE BESYLATE 10 MG PO TABS
10.0000 mg | ORAL_TABLET | Freq: Every day | ORAL | Status: DC
  Administered 2012-08-10 – 2012-08-13 (×4): 10 mg via ORAL
  Filled 2012-08-10 (×4): qty 1

## 2012-08-10 NOTE — Progress Notes (Signed)
[facility]                                                                                Patient Demographics  [name] [name], is a 78 y.o. female  ZOX:[id number]  WJX:[id number]  DOB - [birth year]  Admit date - 08/06/2012  Admitting Physician Gery Pray, MD  Outpatient Primary MD for the patient is Letitia Libra, Ala Dach, MD  LOS - 4   Chief Complaint  Patient presents with  . Fall        Assessment & Plan    1. Multiple Mechanical Falls - she will be seen by physical therapy evaluated for balance and needs for walking aids, she needs placement. CT scan of the brain and spine x-ray of the hips stable.     2. History of Hypothyroidism - home dose Synthroid to be continued , stable baseline TSH.   Lab Results  Component Value Date   TSH 1.881 08/07/2012     3. Depression with anxiety, RLS - continue home medications no acute issues    4. HTN -. is slightly elevated on Lopressor , since heart rate is on the lower side we'll add Norvasc.    5.Hypokalemia - replaced and stable.    6. ARF - due to dehydration, post IV fluids for hydration hold any offending medications at this time, much improved.     7. UTI (lower urinary tract infection) - due to Escherichia coli which was sensitive to Rocephin, patient has been clinically treated with IV Rocephin.    8. Decubitus ulcer of sacral region, unstageable - with minimal surrounding cellulitis no discharge no signs of active infection we'll withhold any prolonged antibiotics. She is afebrile with no leukocytosis.    9. Anemia of chronic disease no acute issues we'll monitor expect to fall further with dilution after IV fluids.    Code Status: DO NOT RESUSCITATE  Family Communication: Discussed with the patient  Disposition Plan: To rehabilitation    Procedures none   Consults  wound care, PT   Time Spent in minutes  35   Antibiotics    Anti-infectives     Start      Dose/Rate Route Frequency Ordered Stop   08/08/12 2000   vancomycin (VANCOCIN) IVPB 1000 mg/200 mL premix  Status:  Discontinued        1,000 mg 200 mL/hr over 60 Minutes Intravenous Every 48 hours 08/07/12 0028 08/07/12 1101   08/06/12 2359   cefTRIAXone (ROCEPHIN) 1 g in dextrose 5 % 50 mL IVPB  Status:  Discontinued        1 g 100 mL/hr over 30 Minutes Intravenous Every 24 hours 08/06/12 2301 08/10/12 1035   08/06/12 2030   vancomycin (VANCOCIN) IVPB 1000 mg/200 mL premix        1,000 mg 200 mL/hr over 60 Minutes Intravenous  Once 08/06/12 2000 08/06/12 2146   08/06/12 2000  piperacillin-tazobactam (ZOSYN) IVPB 3.375 g       3.375 g 100 mL/hr over 30 Minutes Intravenous  Once 08/06/12 2000 08/06/12 2224          Scheduled Meds:    . atenolol  50 mg Oral  Daily  . bisacodyl  5 mg Oral Daily  . collagenase   Topical Daily  . enoxaparin (LOVENOX) injection  30 mg Subcutaneous Q24H  . feeding supplement  237 mL Oral TID BM  . fluticasone  2 spray Each Nare Daily  . gabapentin  300 mg Oral QHS  . levothyroxine  88 mcg Oral Daily  . magnesium oxide  400 mg Oral Daily  . pantoprazole  40 mg Oral Q1200  . rOPINIRole  1 mg Oral Q12H  . rOPINIRole  2 mg Oral Q12H  . sertraline  100 mg Oral Daily  . vitamin B-12  1,000 mcg Oral Daily  . DISCONTD: cefTRIAXone (ROCEPHIN)  IV  1 g Intravenous Q24H   Continuous Infusions:   PRN Meds:.acetaminophen, alum & mag hydroxide-simeth, bisacodyl, cloNIDine, HYDROcodone-acetaminophen, ondansetron (ZOFRAN) IV, ondansetron   DVT Prophylaxis  Lovenox     Susa Raring K M.D on 08/10/2012 at 10:36 AM  Between 7am to 7pm - Pager - [phone]  After 7pm go to [email or web address] - password [email or web address]  And look for the night coverage person covering for me after hours  [facility] Office  [phone]    Subjective:   Vickey Boak today has, No headache, No chest pain, No abdominal pain - No Nausea, No new weakness tingling or  numbness, No Cough - SOB.    Objective:   Filed Vitals:   08/09/12 0535 08/09/12 1328 08/09/12 2224 08/10/12 0720  BP: 151/71 169/64 150/71 159/71  Pulse: 59 57 56 50  Temp: 98.4 F (36.9 C) 98.4 F (36.9 C) 98.3 F (36.8 C) 98.8 F (37.1 C)  TempSrc: Oral Axillary Oral Oral  Resp: 16 16 16 18   Height:      Weight:      SpO2: 96% 97% 95% 97%    Wt Readings from Last 3 Encounters:  08/06/12 52.7 kg (116 lb 2.9 oz)  06/17/12 52.164 kg (115 lb)  05/02/12 54.432 kg (120 lb)     Intake/Output Summary (Last 24 hours) at 08/10/12 1036 Last data filed at 08/10/12 0938  Gross per 24 hour  Intake    360 ml  Output    750 ml  Net   -390 ml    Exam Awake Alert, Oriented X 3, No new F.N deficits, Normal affect [place].AT,PERRAL Supple Neck,No JVD, No cervical lymphadenopathy appriciated.  Symmetrical Chest wall movement, Good air movement bilaterally, CTAB RRR,No Gallops,Rubs or new Murmurs, No Parasternal Heave +ve B.Sounds, Abd Soft, Non tender, No organomegaly appriciated, No rebound - guarding or rigidity. No Cyanosis, Clubbing or edema, No new Rash or bruise , necrotic unstageable sacral decubitus ulcer of about 1 cm in diameter noted.   Data Review   Micro Results Recent Results (from the past 240 hour(s))  MRSA PCR SCREENING     Status: Normal   Collection Time   08/07/12 12:00 AM      Component Value Range Status Comment   MRSA by PCR NEGATIVE  NEGATIVE Final   URINE CULTURE     Status: Normal   Collection Time   08/07/12 11:53 AM      Component Value Range Status Comment   Specimen Description URINE, CLEAN CATCH   Final    Special Requests rocephin   Final    Culture  Setup Time 08/07/2012 23:31   Final    Colony Count >=100,000 COLONIES/ML   Final    Culture ESCHERICHIA COLI   Final    Report Status 08/09/2012 FINAL  Final    Organism ID, Bacteria ESCHERICHIA COLI   Final     Radiology Reports Dg Pelvis 1-2 Views  08/06/2012  *RADIOLOGY REPORT*  Clinical  Data: Fall, pain.  PELVIS - 1-2 VIEW  Comparison: CT of the pelvis 04/07/2012.  Findings: The hips are located.  No fracture is identified.  No notable degenerative change about the hips is seen.  Soft tissue structures are unremarkable. Bones appear osteopenic.  IMPRESSION: No acute finding.   Original Report Authenticated By: Bernadene Bell. Maricela Curet, M.D.    Ct Head Wo Contrast  08/06/2012  *RADIOLOGY REPORT*  Clinical Data:  Unwitnessed fall.  The patient was found lying on her side.  CT HEAD WITHOUT CONTRAST CT CERVICAL SPINE WITHOUT CONTRAST  Technique:  Multidetector CT imaging of the head and cervical spine was performed following the standard protocol without intravenous contrast.  Multiplanar CT image reconstructions of the cervical spine were also generated.  Comparison:  CT head and cervical spine 08/04/2012.  CT HEAD  Findings: No acute cortical infarct, hemorrhage, or mass lesion is present.  Moderate generalized atrophy and diffuse white matter disease is similar to the prior study.  The ventricles are proportionate to the degree of atrophy.  No significant extra-axial fluid collection is present.  The paranasal sinuses and mastoid air cells are clear.  No significant extracranial soft tissue injury is evident.  The osseous skull is intact.  Atherosclerotic calcifications are again seen.  IMPRESSION:  1.  No acute intracranial abnormality or significant interval change. 2.  Stable atrophy and diffuse white matter disease. 3.  Atherosclerosis.  CT CERVICAL SPINE  Findings: The cervical spine is imaged from skull base through T2- 3.  Slight degenerative anterolisthesis at C4-5 and C5-6 is stable. No acute fracture or traumatic subluxation is evident.  The soft tissues are unremarkable.  Biapical pleural parenchymal scarring is present.  IMPRESSION:  1.  Stable degenerative changes of the cervical spine. 2.  No acute fracture or traumatic subluxation.   Original Report Authenticated By: Jamesetta Orleans.  MATTERN, M.D.    Ct Head Wo Contrast  08/04/2012  *RADIOLOGY REPORT*  Clinical Data:  Fall from chair, striking her head.  CT HEAD WITHOUT CONTRAST CT CERVICAL SPINE WITHOUT CONTRAST  Technique:  Multidetector CT imaging of the head and cervical spine was performed following the standard protocol without intravenous contrast.  Multiplanar CT image reconstructions of the cervical spine were also generated.  Comparison:  CT head and cervical spine without contrast 01/17/2012.  CT HEAD  Findings: Mild atrophy and periventricular white matter hypoattenuation is similar to the prior exam.  No acute cortical infarct, hemorrhage, or mass lesion is present.  Vascular calcifications are again noted within the cavernous carotid arteries bilaterally.  The visualized paranasal sinuses and mastoid air cells are clear. The osseous skull is intact.  IMPRESSION:  1.  No acute intracranial abnormality or significant interval change. 2.  Stable atrophy and diffuse white matter disease.  CT CERVICAL SPINE  Findings: Cervical spine is imaged from skull base through T9. Exaggerated cervical kyphosis is more pronounced than on the prior exam.  The vertebral body heights and alignment are maintained. Mild endplate degenerative changes are again seen.  Endplate osteophyte formation is most pronounced at C4-5, as before.  The soft tissues are unremarkable.  The lung apices demonstrate mild dependent atelectasis bilaterally.  Coronary artery calcifications are present.  The heart is enlarged.  IMPRESSION:  1.  Exaggerated cervical thoracic kyphosis. 2.  No acute abnormality.  3.  Stable degenerative changes. 4.  Cardiomegaly with evidence for coronary artery disease.   Original Report Authenticated By: Jamesetta Orleans. MATTERN, M.D.    Ct Cervical Spine Wo Contrast  08/06/2012  *RADIOLOGY REPORT*  Clinical Data:  Unwitnessed fall.  The patient was found lying on her side.  CT HEAD WITHOUT CONTRAST CT CERVICAL SPINE WITHOUT CONTRAST   Technique:  Multidetector CT imaging of the head and cervical spine was performed following the standard protocol without intravenous contrast.  Multiplanar CT image reconstructions of the cervical spine were also generated.  Comparison:  CT head and cervical spine 08/04/2012.  CT HEAD  Findings: No acute cortical infarct, hemorrhage, or mass lesion is present.  Moderate generalized atrophy and diffuse white matter disease is similar to the prior study.  The ventricles are proportionate to the degree of atrophy.  No significant extra-axial fluid collection is present.  The paranasal sinuses and mastoid air cells are clear.  No significant extracranial soft tissue injury is evident.  The osseous skull is intact.  Atherosclerotic calcifications are again seen.  IMPRESSION:  1.  No acute intracranial abnormality or significant interval change. 2.  Stable atrophy and diffuse white matter disease. 3.  Atherosclerosis.  CT CERVICAL SPINE  Findings: The cervical spine is imaged from skull base through T2- 3.  Slight degenerative anterolisthesis at C4-5 and C5-6 is stable. No acute fracture or traumatic subluxation is evident.  The soft tissues are unremarkable.  Biapical pleural parenchymal scarring is present.  IMPRESSION:  1.  Stable degenerative changes of the cervical spine. 2.  No acute fracture or traumatic subluxation.   Original Report Authenticated By: Jamesetta Orleans. MATTERN, M.D.    Ct Cervical Spine Wo Contrast  08/04/2012  *RADIOLOGY REPORT*  Clinical Data:  Fall from chair, striking her head.  CT HEAD WITHOUT CONTRAST CT CERVICAL SPINE WITHOUT CONTRAST  Technique:  Multidetector CT imaging of the head and cervical spine was performed following the standard protocol without intravenous contrast.  Multiplanar CT image reconstructions of the cervical spine were also generated.  Comparison:  CT head and cervical spine without contrast 01/17/2012.  CT HEAD  Findings: Mild atrophy and periventricular white matter  hypoattenuation is similar to the prior exam.  No acute cortical infarct, hemorrhage, or mass lesion is present.  Vascular calcifications are again noted within the cavernous carotid arteries bilaterally.  The visualized paranasal sinuses and mastoid air cells are clear. The osseous skull is intact.  IMPRESSION:  1.  No acute intracranial abnormality or significant interval change. 2.  Stable atrophy and diffuse white matter disease.  CT CERVICAL SPINE  Findings: Cervical spine is imaged from skull base through T9. Exaggerated cervical kyphosis is more pronounced than on the prior exam.  The vertebral body heights and alignment are maintained. Mild endplate degenerative changes are again seen.  Endplate osteophyte formation is most pronounced at C4-5, as before.  The soft tissues are unremarkable.  The lung apices demonstrate mild dependent atelectasis bilaterally.  Coronary artery calcifications are present.  The heart is enlarged.  IMPRESSION:  1.  Exaggerated cervical thoracic kyphosis. 2.  No acute abnormality. 3.  Stable degenerative changes. 4.  Cardiomegaly with evidence for coronary artery disease.   Original Report Authenticated By: Jamesetta Orleans. MATTERN, M.D.     CBC  Lab 08/08/12 0450 08/07/12 0450 08/06/12 1935  WBC 9.7 10.2 11.8*  HGB 9.6* 9.1* 11.1*  HCT 27.7* 26.8* 33.1*  PLT 260 243 207  MCV 92.6 92.7 92.7  MCH 32.1 31.5 31.1  MCHC 34.7 34.0 33.5  RDW 15.0 15.3 15.7*  LYMPHSABS -- -- 0.8  MONOABS -- -- 0.9  EOSABS -- -- 0.4  BASOSABS -- -- 0.1  BANDABS -- -- --    Chemistries   Lab 08/08/12 0450 08/07/12 0450 08/06/12 2150  NA 142 141 139  K 3.6 3.1* 3.2*  CL 111 107 103  CO2 22 26 26   GLUCOSE 95 94 149*  BUN 27* 39* 43*  CREATININE 1.31* 1.70* 1.72*  CALCIUM 8.7 8.3* 8.4  MG 2.3 -- --  AST -- -- 43*  ALT -- -- 42*  ALKPHOS -- -- 502*  BILITOT -- -- 1.2    ------------------------------------------------------------------------------------------------------------------ estimated creatinine clearance is 21.5 ml/min (by C-G formula based on Cr of 1.31). ------------------------------------------------------------------------------------------------------------------ No results found for this basename: HGBA1C:2 in the last 72 hours ------------------------------------------------------------------------------------------------------------------ No results found for this basename: CHOL:2,HDL:2,LDLCALC:2,TRIG:2,CHOLHDL:2,LDLDIRECT:2 in the last 72 hours ------------------------------------------------------------------------------------------------------------------  Basename 08/07/12 1228  TSH 1.881  T4TOTAL --  T3FREE --  THYROIDAB --   ------------------------------------------------------------------------------------------------------------------ No results found for this basename: VITAMINB12:2,FOLATE:2,FERRITIN:2,TIBC:2,IRON:2,RETICCTPCT:2 in the last 72 hours  Coagulation profile No results found for this basename: INR:5,PROTIME:5 in the last 168 hours  No results found for this basename: DDIMER:2 in the last 72 hours  Cardiac Enzymes No results found for this basename: CK:3,CKMB:3,TROPONINI:3,MYOGLOBIN:3 in the last 168 hours ------------------------------------------------------------------------------------------------------------------ No components found with this basename: POCBNP:3

## 2012-08-11 MED ORDER — HYDRALAZINE HCL 25 MG PO TABS
25.0000 mg | ORAL_TABLET | Freq: Three times a day (TID) | ORAL | Status: DC
  Administered 2012-08-11: 25 mg via ORAL
  Filled 2012-08-11 (×6): qty 1

## 2012-08-11 NOTE — Progress Notes (Signed)
[facility]                                                                                Patient Demographics  [name] [name], is a 78 y.o. female  FAO:[id number]  ONG:[id number]  DOB - [birth year]  Admit date - 08/06/2012  Admitting Physician Gery Pray, MD  Outpatient Primary MD for the patient is Letitia Libra, Ala Dach, MD  LOS - 5   Chief Complaint  Patient presents with  . Fall        Assessment & Plan    1. Multiple Mechanical Falls - she will be seen by physical therapy evaluated for balance and needs for walking aids, she needs placement. CT scan of the brain and spine x-ray of the hips stable.     2. History of Hypothyroidism - home dose Synthroid to be continued , stable baseline TSH.   Lab Results  Component Value Date   TSH 1.881 08/07/2012     3. Depression with anxiety, RLS - continue home medications no acute issues    4. HTN - blood pressure medications have been adjusted she is now on Lopressor, Norvasc and today hydralazine has been added for better control.   5.Hypokalemia - replaced and stable.    6. ARF - due to dehydration, post IV fluids for hydration hold any offending medications at this time, much improved.     7. UTI (lower urinary tract infection) - due to Escherichia coli which was sensitive to Rocephin, patient has been clinically treated with IV Rocephin.    8. Decubitus ulcer of sacral region, unstageable - with minimal surrounding cellulitis no discharge no signs of active infection we'll withhold any prolonged antibiotics. She is afebrile with no leukocytosis.    9. Anemia of chronic disease no acute issues we'll monitor expect to fall further with dilution after IV fluids.    Code Status: DO NOT RESUSCITATE confirmed with the son  Family Communication: Discussed with the patient and her son.  Disposition Plan: To rehabilitation    Procedures none   Consults  wound care,  PT   Time Spent in minutes  35   Antibiotics    Anti-infectives     Start     Dose/Rate Route Frequency Ordered Stop   08/08/12 2000   vancomycin (VANCOCIN) IVPB 1000 mg/200 mL premix  Status:  Discontinued        1,000 mg 200 mL/hr over 60 Minutes Intravenous Every 48 hours 08/07/12 0028 08/07/12 1101   08/06/12 2359   cefTRIAXone (ROCEPHIN) 1 g in dextrose 5 % 50 mL IVPB  Status:  Discontinued        1 g 100 mL/hr over 30 Minutes Intravenous Every 24 hours 08/06/12 2301 08/10/12 1035   08/06/12 2030   vancomycin (VANCOCIN) IVPB 1000 mg/200 mL premix        1,000 mg 200 mL/hr over 60 Minutes Intravenous  Once 08/06/12 2000 08/06/12 2146   08/06/12 2000  piperacillin-tazobactam (ZOSYN) IVPB 3.375 g       3.375 g 100 mL/hr over 30 Minutes Intravenous  Once 08/06/12 2000 08/06/12 2224          Scheduled  Meds:    . amLODipine  10 mg Oral Daily  . atenolol  50 mg Oral Daily  . bisacodyl  5 mg Oral Daily  . collagenase   Topical Daily  . enoxaparin (LOVENOX) injection  30 mg Subcutaneous Q24H  . feeding supplement  237 mL Oral TID BM  . fluticasone  2 spray Each Nare Daily  . gabapentin  300 mg Oral QHS  . hydrALAZINE  25 mg Oral Q8H  . levothyroxine  88 mcg Oral Daily  . magnesium oxide  400 mg Oral Daily  . pantoprazole  40 mg Oral Q1200  . rOPINIRole  1 mg Oral Q12H  . rOPINIRole  2 mg Oral Q12H  . sertraline  100 mg Oral Daily  . vitamin B-12  1,000 mcg Oral Daily  . DISCONTD: cefTRIAXone (ROCEPHIN)  IV  1 g Intravenous Q24H   Continuous Infusions:   PRN Meds:.acetaminophen, alum & mag hydroxide-simeth, bisacodyl, cloNIDine, HYDROcodone-acetaminophen, ondansetron (ZOFRAN) IV, ondansetron   DVT Prophylaxis  Lovenox     Susa Raring K M.D on 08/11/2012 at 10:21 AM  Between 7am to 7pm - Pager - [phone]  After 7pm go to [email or web address] - password [email or web address]  And look for the night coverage person covering for me after hours  [facility] Office   [phone]    Subjective:   [name] [name] today has, No headache, No chest pain, No abdominal pain - No Nausea, No new weakness tingling or numbness, No Cough - SOB.    Objective:   Filed Vitals:   08/10/12 0720 08/10/12 1126 08/10/12 2211 08/11/12 0654  BP: 159/71 165/62 181/64 187/66  Pulse: 50 55 53 52  Temp: 98.8 F (37.1 C) 98 F (36.7 C) 98.7 F (37.1 C) 98.7 F (37.1 C)  TempSrc: Oral Oral Oral Oral  Resp: 18 18 18 18   Height:      Weight:      SpO2: 97% 96% 96% 97%    Wt Readings from Last 3 Encounters:  08/06/12 52.7 kg (116 lb 2.9 oz)  06/17/12 52.164 kg (115 lb)  05/02/12 54.432 kg (120 lb)     Intake/Output Summary (Last 24 hours) at 08/11/12 1021 Last data filed at 08/11/12 0900  Gross per 24 hour  Intake    200 ml  Output    350 ml  Net   -150 ml    Exam Awake Alert, Oriented X 3, No new F.N deficits, Normal affect [place].AT,PERRAL Supple Neck,No JVD, No cervical lymphadenopathy appriciated.  Symmetrical Chest wall movement, Good air movement bilaterally, CTAB RRR,No Gallops,Rubs or new Murmurs, No Parasternal Heave +ve B.Sounds, Abd Soft, Non tender, No organomegaly appriciated, No rebound - guarding or rigidity. No Cyanosis, Clubbing or edema, No new Rash or bruise , necrotic unstageable sacral decubitus ulcer of about 1 cm in diameter noted.   Data Review   Micro Results Recent Results (from the past 240 hour(s))  MRSA PCR SCREENING     Status: Normal   Collection Time   08/07/12 12:00 AM      Component Value Range Status Comment   MRSA by PCR NEGATIVE  NEGATIVE Final   URINE CULTURE     Status: Normal   Collection Time   08/07/12 11:53 AM      Component Value Range Status Comment   Specimen Description URINE, CLEAN CATCH   Final    Special Requests rocephin   Final    Culture  Setup Time 08/07/2012 23:31   Final  Colony Count >=100,000 COLONIES/ML   Final    Culture ESCHERICHIA COLI   Final    Report Status 08/09/2012 FINAL    Final    Organism ID, Bacteria ESCHERICHIA COLI   Final     Radiology Reports Dg Pelvis 1-2 Views  08/06/2012  *RADIOLOGY REPORT*  Clinical Data: Fall, pain.  PELVIS - 1-2 VIEW  Comparison: CT of the pelvis 04/07/2012.  Findings: The hips are located.  No fracture is identified.  No notable degenerative change about the hips is seen.  Soft tissue structures are unremarkable. Bones appear osteopenic.  IMPRESSION: No acute finding.   Original Report Authenticated By: Bernadene Bell. Maricela Curet, M.D.    Ct Head Wo Contrast  08/06/2012  *RADIOLOGY REPORT*  Clinical Data:  Unwitnessed fall.  The patient was found lying on her side.  CT HEAD WITHOUT CONTRAST CT CERVICAL SPINE WITHOUT CONTRAST  Technique:  Multidetector CT imaging of the head and cervical spine was performed following the standard protocol without intravenous contrast.  Multiplanar CT image reconstructions of the cervical spine were also generated.  Comparison:  CT head and cervical spine 08/04/2012.  CT HEAD  Findings: No acute cortical infarct, hemorrhage, or mass lesion is present.  Moderate generalized atrophy and diffuse white matter disease is similar to the prior study.  The ventricles are proportionate to the degree of atrophy.  No significant extra-axial fluid collection is present.  The paranasal sinuses and mastoid air cells are clear.  No significant extracranial soft tissue injury is evident.  The osseous skull is intact.  Atherosclerotic calcifications are again seen.  IMPRESSION:  1.  No acute intracranial abnormality or significant interval change. 2.  Stable atrophy and diffuse white matter disease. 3.  Atherosclerosis.  CT CERVICAL SPINE  Findings: The cervical spine is imaged from skull base through T2- 3.  Slight degenerative anterolisthesis at C4-5 and C5-6 is stable. No acute fracture or traumatic subluxation is evident.  The soft tissues are unremarkable.  Biapical pleural parenchymal scarring is present.  IMPRESSION:  1.  Stable  degenerative changes of the cervical spine. 2.  No acute fracture or traumatic subluxation.   Original Report Authenticated By: Jamesetta Orleans. MATTERN, M.D.    Ct Head Wo Contrast  08/04/2012  *RADIOLOGY REPORT*  Clinical Data:  Fall from chair, striking her head.  CT HEAD WITHOUT CONTRAST CT CERVICAL SPINE WITHOUT CONTRAST  Technique:  Multidetector CT imaging of the head and cervical spine was performed following the standard protocol without intravenous contrast.  Multiplanar CT image reconstructions of the cervical spine were also generated.  Comparison:  CT head and cervical spine without contrast 01/17/2012.  CT HEAD  Findings: Mild atrophy and periventricular white matter hypoattenuation is similar to the prior exam.  No acute cortical infarct, hemorrhage, or mass lesion is present.  Vascular calcifications are again noted within the cavernous carotid arteries bilaterally.  The visualized paranasal sinuses and mastoid air cells are clear. The osseous skull is intact.  IMPRESSION:  1.  No acute intracranial abnormality or significant interval change. 2.  Stable atrophy and diffuse white matter disease.  CT CERVICAL SPINE  Findings: Cervical spine is imaged from skull base through T9. Exaggerated cervical kyphosis is more pronounced than on the prior exam.  The vertebral body heights and alignment are maintained. Mild endplate degenerative changes are again seen.  Endplate osteophyte formation is most pronounced at C4-5, as before.  The soft tissues are unremarkable.  The lung apices demonstrate mild dependent atelectasis bilaterally.  Coronary artery calcifications are present.  The heart is enlarged.  IMPRESSION:  1.  Exaggerated cervical thoracic kyphosis. 2.  No acute abnormality. 3.  Stable degenerative changes. 4.  Cardiomegaly with evidence for coronary artery disease.   Original Report Authenticated By: Jamesetta Orleans. MATTERN, M.D.    Ct Cervical Spine Wo Contrast  08/06/2012  *RADIOLOGY REPORT*   Clinical Data:  Unwitnessed fall.  The patient was found lying on her side.  CT HEAD WITHOUT CONTRAST CT CERVICAL SPINE WITHOUT CONTRAST  Technique:  Multidetector CT imaging of the head and cervical spine was performed following the standard protocol without intravenous contrast.  Multiplanar CT image reconstructions of the cervical spine were also generated.  Comparison:  CT head and cervical spine 08/04/2012.  CT HEAD  Findings: No acute cortical infarct, hemorrhage, or mass lesion is present.  Moderate generalized atrophy and diffuse white matter disease is similar to the prior study.  The ventricles are proportionate to the degree of atrophy.  No significant extra-axial fluid collection is present.  The paranasal sinuses and mastoid air cells are clear.  No significant extracranial soft tissue injury is evident.  The osseous skull is intact.  Atherosclerotic calcifications are again seen.  IMPRESSION:  1.  No acute intracranial abnormality or significant interval change. 2.  Stable atrophy and diffuse white matter disease. 3.  Atherosclerosis.  CT CERVICAL SPINE  Findings: The cervical spine is imaged from skull base through T2- 3.  Slight degenerative anterolisthesis at C4-5 and C5-6 is stable. No acute fracture or traumatic subluxation is evident.  The soft tissues are unremarkable.  Biapical pleural parenchymal scarring is present.  IMPRESSION:  1.  Stable degenerative changes of the cervical spine. 2.  No acute fracture or traumatic subluxation.   Original Report Authenticated By: Jamesetta Orleans. MATTERN, M.D.    Ct Cervical Spine Wo Contrast  08/04/2012  *RADIOLOGY REPORT*  Clinical Data:  Fall from chair, striking her head.  CT HEAD WITHOUT CONTRAST CT CERVICAL SPINE WITHOUT CONTRAST  Technique:  Multidetector CT imaging of the head and cervical spine was performed following the standard protocol without intravenous contrast.  Multiplanar CT image reconstructions of the cervical spine were also  generated.  Comparison:  CT head and cervical spine without contrast 01/17/2012.  CT HEAD  Findings: Mild atrophy and periventricular white matter hypoattenuation is similar to the prior exam.  No acute cortical infarct, hemorrhage, or mass lesion is present.  Vascular calcifications are again noted within the cavernous carotid arteries bilaterally.  The visualized paranasal sinuses and mastoid air cells are clear. The osseous skull is intact.  IMPRESSION:  1.  No acute intracranial abnormality or significant interval change. 2.  Stable atrophy and diffuse white matter disease.  CT CERVICAL SPINE  Findings: Cervical spine is imaged from skull base through T9. Exaggerated cervical kyphosis is more pronounced than on the prior exam.  The vertebral body heights and alignment are maintained. Mild endplate degenerative changes are again seen.  Endplate osteophyte formation is most pronounced at C4-5, as before.  The soft tissues are unremarkable.  The lung apices demonstrate mild dependent atelectasis bilaterally.  Coronary artery calcifications are present.  The heart is enlarged.  IMPRESSION:  1.  Exaggerated cervical thoracic kyphosis. 2.  No acute abnormality. 3.  Stable degenerative changes. 4.  Cardiomegaly with evidence for coronary artery disease.   Original Report Authenticated By: Jamesetta Orleans. MATTERN, M.D.     CBC  Lab 08/08/12 0450 08/07/12 0450 08/06/12 1935  WBC  9.7 10.2 11.8*  HGB 9.6* 9.1* 11.1*  HCT 27.7* 26.8* 33.1*  PLT 260 243 207  MCV 92.6 92.7 92.7  MCH 32.1 31.5 31.1  MCHC 34.7 34.0 33.5  RDW 15.0 15.3 15.7*  LYMPHSABS -- -- 0.8  MONOABS -- -- 0.9  EOSABS -- -- 0.4  BASOSABS -- -- 0.1  BANDABS -- -- --    Chemistries   Lab 08/08/12 0450 08/07/12 0450 08/06/12 2150  NA 142 141 139  K 3.6 3.1* 3.2*  CL 111 107 103  CO2 22 26 26   GLUCOSE 95 94 149*  BUN 27* 39* 43*  CREATININE 1.31* 1.70* 1.72*  CALCIUM 8.7 8.3* 8.4  MG 2.3 -- --  AST -- -- 43*  ALT -- -- 42*    ALKPHOS -- -- 502*  BILITOT -- -- 1.2   ------------------------------------------------------------------------------------------------------------------ estimated creatinine clearance is 21.5 ml/min (by C-G formula based on Cr of 1.31). ------------------------------------------------------------------------------------------------------------------ No results found for this basename: HGBA1C:2 in the last 72 hours ------------------------------------------------------------------------------------------------------------------ No results found for this basename: CHOL:2,HDL:2,LDLCALC:2,TRIG:2,CHOLHDL:2,LDLDIRECT:2 in the last 72 hours ------------------------------------------------------------------------------------------------------------------ No results found for this basename: TSH,T4TOTAL,FREET3,T3FREE,THYROIDAB in the last 72 hours ------------------------------------------------------------------------------------------------------------------ No results found for this basename: VITAMINB12:2,FOLATE:2,FERRITIN:2,TIBC:2,IRON:2,RETICCTPCT:2 in the last 72 hours  Coagulation profile No results found for this basename: INR:5,PROTIME:5 in the last 168 hours  No results found for this basename: DDIMER:2 in the last 72 hours  Cardiac Enzymes No results found for this basename: CK:3,CKMB:3,TROPONINI:3,MYOGLOBIN:3 in the last 168 hours ------------------------------------------------------------------------------------------------------------------ No components found with this basename: POCBNP:3

## 2012-08-11 NOTE — Progress Notes (Signed)
Gave [name] bed offers via phone.    [name] asking for clarification as to why [facility] and [facility] declined Pt.  He stated that Pt was at [facility] earlier this year and that Pt received quality care there.  [name] inquiring about [facility], as their response on [facility] was "considering."  CSW to contact these facilities on [name] behalf.  Contacted [facility].  No admissions person working today.  Contacted [facility].  LM for [employer].  Contacted [facility].  LM for [facility].  Notified [name].  CSW to continue to follow.  Providence Crosby, LCSWA Clinical Social Work [phone]

## 2012-08-11 NOTE — Progress Notes (Signed)
Chart review.    Awaiting [employer] auth.  Office closed today.  CSW to f/u with ins company tomorrow.  Providence Crosby, LCSWA Clinical Social Work [phone]

## 2012-08-11 NOTE — Progress Notes (Signed)
Physical Therapy Treatment Patient Details Name: [name] [name] MRN: [id number] DOB: Apr 10, 1919 Today's Date: 08/11/2012 Time: 1478-2956 PT Time Calculation (min): 25 min  PT Assessment / Plan / Recommendation Comments on Treatment Session  Pt in bed on an air matress overlay due to sacral wound.  Assisted pt to EOB + 2 assist pt 10%.  Pt sat EOB but required total assist as pt demon severe kyphosis and forward flex cervical posture.  Son came in during session. Unable to amb pt 2nd continous loose stools.  Assisted back to bed after sitting on BSC nearly 10 min. Very weak. Progressing slowly.    Follow Up Recommendations  Skilled nursing facility    Barriers to Discharge        Equipment Recommendations  Defer to next venue    Recommendations for Other Services    Frequency Min 3X/week   Plan Discharge plan remains appropriate    Precautions / Restrictions Precautions Precautions: Fall Restrictions Weight Bearing Restrictions: No    Pertinent Vitals/Pain No c/o pain    Mobility  Bed Mobility Bed Mobility: Supine to Sit;Sit to Supine Rolling Right: 1: +2 Total assist Rolling Right: Patient Percentage: 10% Rolling Left: 1: +2 Total assist Rolling Left: Patient Percentage: 10% Details for Bed Mobility Assistance: sat EOB x 3 min at Total assist + 1 due to severe posterior LOB/lean.  Poor kyphotic forward flex cerviacl posture.  Transfers Transfers: [employer] manager Transfers: 1: +2 Total assist Squat Pivot Transfers: Patient Percentage: 0% Details for Transfer Assistance: from bed to [facility] then back to bed.  Attempted use of RW however pt unable to functionasl use.  Ambulation/Gait Ambulation/Gait Assistance: Not tested (comment) Ambulation/Gait Assistance Details: Unable to attempt amb due to continous loose stools and inability to even sit EOB functionally.     PT Goals                         progressing    Visit Information  Last PT Received On:  08/11/12 Assistance Needed: +2    Subjective Data  Subjective: I want some water Patient Stated Goal: n/a   Cognition    fair   Balance   zero  End of Session PT - End of Session Equipment Utilized During Treatment: Gait belt Activity Tolerance: Treatment limited secondary to medical complications (Comment);Other (comment) (loose stools and weakeness)   Felecia Shelling  PTA [facility]  Acute  Rehab Pager     [phone]

## 2012-08-12 DIAGNOSIS — L8990 Pressure ulcer of unspecified site, unspecified stage: Secondary | ICD-10-CM

## 2012-08-12 DIAGNOSIS — L89109 Pressure ulcer of unspecified part of back, unspecified stage: Secondary | ICD-10-CM

## 2012-08-12 DIAGNOSIS — L8995 Pressure ulcer of unspecified site, unstageable: Secondary | ICD-10-CM

## 2012-08-12 MED ORDER — HYDRALAZINE HCL 10 MG PO TABS
10.0000 mg | ORAL_TABLET | Freq: Three times a day (TID) | ORAL | Status: DC
  Administered 2012-08-12 – 2012-08-13 (×4): 10 mg via ORAL
  Filled 2012-08-12 (×6): qty 1

## 2012-08-12 MED ORDER — AMLODIPINE BESYLATE 10 MG PO TABS: 10.0000 mg | ORAL_TABLET | Freq: Every day | ORAL | Status: DC

## 2012-08-12 MED ORDER — SENNA 8.6 MG PO TABS
1.0000 | ORAL_TABLET | Freq: Every day | ORAL | Status: DC
  Administered 2012-08-12 – 2012-08-13 (×2): 8.6 mg via ORAL
  Filled 2012-08-12 (×2): qty 1

## 2012-08-12 MED ORDER — HYDROCODONE-ACETAMINOPHEN 5-500 MG PO TABS: 1.0000 | ORAL_TABLET | Freq: Two times a day (BID) | ORAL | Status: DC

## 2012-08-12 MED ORDER — MAGNESIUM HYDROXIDE 400 MG/5ML PO SUSP
30.0000 mL | Freq: Once | ORAL | Status: AC
  Administered 2012-08-12: 30 mL via ORAL
  Filled 2012-08-12: qty 30

## 2012-08-12 MED ORDER — HYDRALAZINE HCL 10 MG PO TABS: 10.0000 mg | ORAL_TABLET | Freq: Three times a day (TID) | ORAL | Status: DC

## 2012-08-12 MED ORDER — DOCUSATE SODIUM 100 MG PO CAPS
100.0000 mg | ORAL_CAPSULE | Freq: Every day | ORAL | Status: DC
  Administered 2012-08-12 – 2012-08-13 (×2): 100 mg via ORAL
  Filled 2012-08-12 (×2): qty 1

## 2012-08-12 NOTE — Progress Notes (Signed)
PT Cancellation Note  Treatment cancelled today due to pt up in chair and reported being too tired for therapy today.  Repositioned pillows to comfort.  Will check back as schedule permits.  Unnamed [name],[name] [name] 08/12/2012, 3:35 PM Pager: [phone]

## 2012-08-12 NOTE — Progress Notes (Signed)
[name] able to offer Pt a bed.  Carla from [facility] can meet with family at [facility] at 1400.  Notified family.  Family in agreement with [facility] and can meet with Carla at 1400.  Family asking CSW to f/u with [facility], one more time, to get clarification as to why they didn't offer a bed.  Spoke with [name] December at [facility].  Per [name] December, facility declining Pt due to not feeling that Pt can fully participate in therapy.  Relayed information to family.  Pt verbalized an understanding and stated that they are fine with Pt going to [facility].  Notified Steward Drone with [facility].  Steward Drone requesting d/c summary, as ins in concerned that Pt may not be able to fully participate in therapy, in which case, ins would not cover it.  Faxed d/c summary to [place] with [facility].  T/c from [name].  [name] stating that he can go to [facility] [employer] now to sign paperwork.  Relayed [employer]'s concerns re: Pt's potential for rehab.  [name] voiced an understanding and asked that CSW contact him as soon as the insurance company has made a decision.  This will help him decide if he wants to meet Albin Felling here to sign papers at 1400 or if it is more convenient for him to go to [facility].  CSW thanked [name] for his time and stated that CSW will contact him as soon as the insurance company has made a decision.  Providence Crosby, LCSWA Clinical Social Work [phone]

## 2012-08-12 NOTE — Progress Notes (Signed)
Blood pressure med held times 2 secondary low diastolic blood pressure.

## 2012-08-12 NOTE — Progress Notes (Signed)
CSW assisting with d/c planning.  Call received from [facility]. Medical Director has clinical info but decision has not yet been made regarding approval for SNF coverage. Once decision is made CSW will alert pt/family/medical team/SNF.  Cori Razor LCSW [phone]

## 2012-08-12 NOTE — Progress Notes (Signed)
[employer] insurance has not made a decision yet regarding SNF coverage for this pt. Pt/family/nsg/SNF notified. SNF bed will be available tomorrow if prior approval from [place] is received.  Cori Razor LCSW [phone]

## 2012-08-12 NOTE — Progress Notes (Addendum)
Steward Drone at [facility] requesting PT notes from 9/2.  Steward Drone stating that [facility] is out-of-network and that the family will be responsible for 20% of the bill.  Steward Drone stating that [facility], [facility], [facility] and [facility] are all in-network.  CSW to contact [place] when family has made a decision.  Karlyne Greenspan PT notes from 9/2.  LM for [name] December at [facility].  Spoke with Tammy at [facility].  Tammy to look into the reason they declined to admit Pt and call CSW back.  Relayed information from [place] at [facility] to [name].  Son to call [facility] to discuss the 20% that the family would be responsible for, should they chose [facility].  CSW to continue to follow.  Providence Crosby, LCSWA Clinical Social Work [phone]

## 2012-08-12 NOTE — Progress Notes (Signed)
Notified [name] [name] at [facility] that we are awaiting ins auth.  [name] [name] voiced an understanding and stated that she intends to be at [facility] at 1400 to sign papers.    Notified [name] that [name] [name] would like to go ahead and sign papers today at [facility] at 1400.  Son agreed.  Son stated that he'd like for Pt to return to ALF, should ins not authorize SNF.  Notified RN of situation.  CSW to continue to follow.  Providence Crosby, LCSWA Clinical Social Work [phone]

## 2012-08-12 NOTE — Discharge Summary (Addendum)
[facility]                                                                                   [name] [name], is a 78 y.o. female  DOB Mar 08, 1919  MRN [id number].  Admission date:  08/06/2012  Discharge Date:  08/12/2012  Primary MD  Estill Cotta, MD  Admitting Physician  Gery Pray, MD  Admission Diagnosis  UTI (lower urinary tract infection) [599.0] FALL  Discharge Diagnosis     Principal Problem:  *Fall Active Problems:  Hypothyroidism  Depression with anxiety  RLS (restless legs syndrome)  HTN (hypertension)  Hypokalemia  Metabolic encephalopathy  CKD (chronic kidney disease) stage 2, GFR 60-89 ml/min  Constipation  UTI (lower urinary tract infection)  Decubitus ulcer of sacral region, unstageable   Past Medical History  Diagnosis Date  . Arthritis   . History of chicken pox   . Depression     husband died 09/26/2011  . Glaucoma   . Thyroid disease   . Hypertension   . Hypokalemia   . Renal disorder   . Altered mental state     Past Surgical History  Procedure Date  . Revision total knee arthroplasty     right knee  . Back surgery   . Cataract extraction, bilateral   . Thyroidectomy   . Abdominal hysterectomy         Discharge Diagnoses:   Principal Problem:  *Fall Active Problems:  Hypothyroidism  Depression with anxiety  RLS (restless legs syndrome)  HTN (hypertension)  Hypokalemia  Metabolic encephalopathy  CKD (chronic kidney disease) stage 2, GFR 60-89 ml/min  Constipation  UTI (lower urinary tract infection)  Decubitus ulcer of sacral region, unstageable    Discharge Condition: Stable   Diet recommendation: See Discharge Instructions below   Consults - wound care and physical therapy   History of present illness and  Hospital Course:  See H&P, Labs, Consult and Test reports for all details in brief, patient was admitted for   Multiple falls at home due to generalized deconditioning and  poor baseline balance, she underwent head CT and x-rays of her hip which were all unremarkable i.e. no acute. She was seen by physical therapy and will require placement to a higher level of care i.e. either rehabilitation or skilled nursing facility for continued physical therapy and balance assistance.    Patient has unstageable sacral decubitus ulcer which is necrotic and 1 cm in diameter, I do not see any active signs of surrounding cellulitis, local wound care at this time is recommended with clinical monitoring for any signs of infection in the future.    Patient has hypothyroidism depression and restless leg syndrome which are all stable her home medications will be continued, her TSH was 1.8 this admission.   Her blood pressure was in slight poor control for which her blood pressure medications have been adjusted she used to be only on Lopressor he have gently added Norvasc and hydralazine for better blood pressure control.   He should also had acute renal failure from dehydration along with Escherichia coli UTI but have been treated with IV fluids and IV Rocephin.  Patient has anemia of chronic disease which is stable goal is to keep her hemoglobin above 7.    Today   Subjective:   [name] [name] today has no headache,no chest abdominal pain,no new weakness tingling or numbness, feels much better.  Objective:   Blood pressure 138/67, pulse 58, temperature 97.5 F (36.4 C), temperature source Oral, resp. rate 20, height 5\' 1"  (1.549 m), weight 52.7 kg (116 lb 2.9 oz), SpO2 98.00%.   Intake/Output Summary (Last 24 hours) at 08/12/12 1014 Last data filed at 08/12/12 0513  Gross per 24 hour  Intake    120 ml  Output    250 ml  Net   -130 ml    Exam Awake Alert, Oriented *3, No new F.N deficits, Normal affect [place].AT,PERRAL Supple Neck,No JVD, No cervical lymphadenopathy appriciated.  Symmetrical Chest wall movement, Good air movement bilaterally, CTAB RRR,No  Gallops,Rubs or new Murmurs, No Parasternal Heave +ve B.Sounds, Abd Soft, Non tender, No organomegaly appriciated, No rebound -guarding or rigidity. No Cyanosis, Clubbing or edema, No new Rash or bruise, unstageable 1 cm in diameter sacral decubiti salsa  Data Review   Major procedures and Radiology Reports - PLEASE review detailed and final reports for all details in brief -     Dg Pelvis 1-2 Views  08/06/2012  *RADIOLOGY REPORT*  Clinical Data: Fall, pain.  PELVIS - 1-2 VIEW  Comparison: CT of the pelvis 04/07/2012.  Findings: The hips are located.  No fracture is identified.  No notable degenerative change about the hips is seen.  Soft tissue structures are unremarkable. Bones appear osteopenic.  IMPRESSION: No acute finding.   Original Report Authenticated By: Bernadene Bell. Maricela Curet, M.D.    Ct Head Wo Contrast  08/06/2012  *RADIOLOGY REPORT*  Clinical Data:  Unwitnessed fall.  The patient was found lying on her side.  CT HEAD WITHOUT CONTRAST CT CERVICAL SPINE WITHOUT CONTRAST  Technique:  Multidetector CT imaging of the head and cervical spine was performed following the standard protocol without intravenous contrast.  Multiplanar CT image reconstructions of the cervical spine were also generated.  Comparison:  CT head and cervical spine 08/04/2012.  CT HEAD  Findings: No acute cortical infarct, hemorrhage, or mass lesion is present.  Moderate generalized atrophy and diffuse white matter disease is similar to the prior study.  The ventricles are proportionate to the degree of atrophy.  No significant extra-axial fluid collection is present.  The paranasal sinuses and mastoid air cells are clear.  No significant extracranial soft tissue injury is evident.  The osseous skull is intact.  Atherosclerotic calcifications are again seen.  IMPRESSION:  1.  No acute intracranial abnormality or significant interval change. 2.  Stable atrophy and diffuse white matter disease. 3.  Atherosclerosis.  CT CERVICAL  SPINE  Findings: The cervical spine is imaged from skull base through T2- 3.  Slight degenerative anterolisthesis at C4-5 and C5-6 is stable. No acute fracture or traumatic subluxation is evident.  The soft tissues are unremarkable.  Biapical pleural parenchymal scarring is present.  IMPRESSION:  1.  Stable degenerative changes of the cervical spine. 2.  No acute fracture or traumatic subluxation.   Original Report Authenticated By: Jamesetta Orleans. MATTERN, M.D.    Ct Head Wo Contrast  08/04/2012  *RADIOLOGY REPORT*  Clinical Data:  Fall from chair, striking her head.  CT HEAD WITHOUT CONTRAST CT CERVICAL SPINE WITHOUT CONTRAST  Technique:  Multidetector CT imaging of the head and cervical spine was performed following the standard  protocol without intravenous contrast.  Multiplanar CT image reconstructions of the cervical spine were also generated.  Comparison:  CT head and cervical spine without contrast 01/17/2012.  CT HEAD  Findings: Mild atrophy and periventricular white matter hypoattenuation is similar to the prior exam.  No acute cortical infarct, hemorrhage, or mass lesion is present.  Vascular calcifications are again noted within the cavernous carotid arteries bilaterally.  The visualized paranasal sinuses and mastoid air cells are clear. The osseous skull is intact.  IMPRESSION:  1.  No acute intracranial abnormality or significant interval change. 2.  Stable atrophy and diffuse white matter disease.  CT CERVICAL SPINE  Findings: Cervical spine is imaged from skull base through T9. Exaggerated cervical kyphosis is more pronounced than on the prior exam.  The vertebral body heights and alignment are maintained. Mild endplate degenerative changes are again seen.  Endplate osteophyte formation is most pronounced at C4-5, as before.  The soft tissues are unremarkable.  The lung apices demonstrate mild dependent atelectasis bilaterally.  Coronary artery calcifications are present.  The heart is enlarged.   IMPRESSION:  1.  Exaggerated cervical thoracic kyphosis. 2.  No acute abnormality. 3.  Stable degenerative changes. 4.  Cardiomegaly with evidence for coronary artery disease.   Original Report Authenticated By: Jamesetta Orleans. MATTERN, M.D.    Ct Cervical Spine Wo Contrast  08/06/2012  *RADIOLOGY REPORT*  Clinical Data:  Unwitnessed fall.  The patient was found lying on her side.  CT HEAD WITHOUT CONTRAST CT CERVICAL SPINE WITHOUT CONTRAST  Technique:  Multidetector CT imaging of the head and cervical spine was performed following the standard protocol without intravenous contrast.  Multiplanar CT image reconstructions of the cervical spine were also generated.  Comparison:  CT head and cervical spine 08/04/2012.  CT HEAD  Findings: No acute cortical infarct, hemorrhage, or mass lesion is present.  Moderate generalized atrophy and diffuse white matter disease is similar to the prior study.  The ventricles are proportionate to the degree of atrophy.  No significant extra-axial fluid collection is present.  The paranasal sinuses and mastoid air cells are clear.  No significant extracranial soft tissue injury is evident.  The osseous skull is intact.  Atherosclerotic calcifications are again seen.  IMPRESSION:  1.  No acute intracranial abnormality or significant interval change. 2.  Stable atrophy and diffuse white matter disease. 3.  Atherosclerosis.  CT CERVICAL SPINE  Findings: The cervical spine is imaged from skull base through T2- 3.  Slight degenerative anterolisthesis at C4-5 and C5-6 is stable. No acute fracture or traumatic subluxation is evident.  The soft tissues are unremarkable.  Biapical pleural parenchymal scarring is present.  IMPRESSION:  1.  Stable degenerative changes of the cervical spine. 2.  No acute fracture or traumatic subluxation.   Original Report Authenticated By: Jamesetta Orleans. MATTERN, M.D.    Ct Cervical Spine Wo Contrast  08/04/2012  *RADIOLOGY REPORT*  Clinical Data:  Fall from  chair, striking her head.  CT HEAD WITHOUT CONTRAST CT CERVICAL SPINE WITHOUT CONTRAST  Technique:  Multidetector CT imaging of the head and cervical spine was performed following the standard protocol without intravenous contrast.  Multiplanar CT image reconstructions of the cervical spine were also generated.  Comparison:  CT head and cervical spine without contrast 01/17/2012.  CT HEAD  Findings: Mild atrophy and periventricular white matter hypoattenuation is similar to the prior exam.  No acute cortical infarct, hemorrhage, or mass lesion is present.  Vascular calcifications are again noted within  the cavernous carotid arteries bilaterally.  The visualized paranasal sinuses and mastoid air cells are clear. The osseous skull is intact.  IMPRESSION:  1.  No acute intracranial abnormality or significant interval change. 2.  Stable atrophy and diffuse white matter disease.  CT CERVICAL SPINE  Findings: Cervical spine is imaged from skull base through T9. Exaggerated cervical kyphosis is more pronounced than on the prior exam.  The vertebral body heights and alignment are maintained. Mild endplate degenerative changes are again seen.  Endplate osteophyte formation is most pronounced at C4-5, as before.  The soft tissues are unremarkable.  The lung apices demonstrate mild dependent atelectasis bilaterally.  Coronary artery calcifications are present.  The heart is enlarged.  IMPRESSION:  1.  Exaggerated cervical thoracic kyphosis. 2.  No acute abnormality. 3.  Stable degenerative changes. 4.  Cardiomegaly with evidence for coronary artery disease.   Original Report Authenticated By: Jamesetta Orleans. MATTERN, M.D.     Micro Results      Recent Results (from the past 240 hour(s))  MRSA PCR SCREENING     Status: Normal   Collection Time   08/07/12 12:00 AM      Component Value Range Status Comment   MRSA by PCR NEGATIVE  NEGATIVE Final   URINE CULTURE     Status: Normal   Collection Time   08/07/12 11:53 AM        Component Value Range Status Comment   Specimen Description URINE, CLEAN CATCH   Final    Special Requests rocephin   Final    Culture  Setup Time 08/07/2012 23:31   Final    Colony Count >=100,000 COLONIES/ML   Final    Culture ESCHERICHIA COLI   Final    Report Status 08/09/2012 FINAL   Final    Organism ID, Bacteria ESCHERICHIA COLI   Final      CBC w Diff: Lab Results  Component Value Date   WBC 9.7 08/08/2012   HGB 9.6* 08/08/2012   HCT 27.7* 08/08/2012   PLT 260 08/08/2012   LYMPHOPCT 7* 08/06/2012   MONOPCT 8 08/06/2012   EOSPCT 3 08/06/2012   BASOPCT 1 08/06/2012    CMP: Lab Results  Component Value Date   NA 142 08/08/2012   K 3.6 08/08/2012   CL 111 08/08/2012   CO2 22 08/08/2012   BUN 27* 08/08/2012   CREATININE 1.31* 08/08/2012   CREATININE 1.10 05/02/2012   PROT 7.0 08/06/2012   ALBUMIN 2.6* 08/06/2012   BILITOT 1.2 08/06/2012   ALKPHOS 502* 08/06/2012   AST 43* 08/06/2012   ALT 42* 08/06/2012  .   Discharge Instructions     Follow with Primary MD Letitia Libra, Ala Dach, MD in 7 days   Get CBC, CMP, checked 7 days by Primary MD and again as instructed by your Primary MD.    Get Medicines reviewed and adjusted.  Please request your Prim.MD to go over all Hospital Tests and Procedure/Radiological results at the follow up, please get all Hospital records sent to your Prim MD by signing hospital release before you go home.  Activity: As tolerated with Full fall precautions use walker/cane & assistance as needed.  Provide wound care for her sacral decubitus ulcer which is unstageable.   Diet:  Heart healthy with full assistance and Aspiration precautions.  For Heart failure patients - Check your Weight same time everyday, if you gain over 2 pounds, or you develop in leg swelling, experience more shortness of breath or  chest pain, call your Primary MD immediately. Follow Cardiac Low Salt Diet and 1.8 lit/day fluid restriction.  Disposition SNF  If you  experience worsening of your admission symptoms, develop shortness of breath, life threatening emergency, suicidal or homicidal thoughts you must seek medical attention immediately by calling 911 or calling your MD immediately  if symptoms less severe.  You Must read complete instructions/literature along with all the possible adverse reactions/side effects for all the Medicines you take and that have been prescribed to you. Take any new Medicines after you have completely understood and accpet all the possible adverse reactions/side effects.   Follow-up Information    Follow up with Letitia Libra, Ala Dach, MD. Schedule an appointment as soon as possible for a visit in 1 week.   Contact information:   [address] [place] [address] [phone]            Discharge Medications    [name] [name], [name] [name]  Home Medication Instructions [id number]   Printed on:08/12/12 1024  Medication Information                    atenolol (TENORMIN) 50 MG tablet Take 50 mg by mouth daily.             furosemide (LASIX) 20 MG tablet Take 20 mg by mouth daily.             levothyroxine (SYNTHROID, LEVOTHROID) 88 MCG tablet Take 88 mcg by mouth daily.            fluticasone (FLONASE) 50 MCG/ACT nasal spray Place 2 sprays into the nose daily.           bisacodyl (DULCOLAX) 5 MG EC tablet Take 5 mg by mouth daily.            vitamin B-12 (CYANOCOBALAMIN) 1000 MCG tablet Take 1,000 mcg by mouth daily.           Multiple Vitamins-Minerals (PRESERVISION AREDS 2 PO) Take 1 tablet by mouth daily.           ENSURE (ENSURE) Take 237 mLs by mouth 3 (three) times daily between meals. Ensure butter/pecan liquid           omeprazole (PRILOSEC) 40 MG capsule Take 1 capsule (40 mg total) by mouth daily.           magnesium oxide (MAG-OX 400) 400 MG tablet Take 1 tablet (400 mg total) by mouth daily.           sertraline (ZOLOFT) 100 MG tablet Take 1 tablet (100 mg total)  by mouth daily.           rOPINIRole (REQUIP) 1 MG tablet Take 1-2 mg by mouth 4 (four) times daily. 1mg  at 2AM; 2mg  at 8AM; 1mg  at [facility]; 2mg  at 8PM           lidocaine (LIDODERM) 5 % Place 0.25 patches onto the skin daily. Remove & Discard patch within 12 hours or as directed by MD; applied to neck.           gabapentin (NEURONTIN) 300 MG capsule Take 600 mg by mouth at bedtime.           acetaminophen (TYLENOL) 325 MG tablet Take 650 mg by mouth every 6 (six) hours as needed. For pain.           amLODipine (NORVASC) 10 MG tablet Take 1 tablet (10 mg total) by mouth daily.  hydrALAZINE (APRESOLINE) 10 MG tablet Take 1 tablet (10 mg total) by mouth every 8 (eight) hours.           HYDROcodone-acetaminophen (VICODIN) 5-500 MG per tablet Take 1 tablet by mouth 2 (two) times daily.               Total Time in preparing paper work, data evaluation and todays exam - 35 minutes  Leroy Sea M.D on 08/12/2012 at 10:14 AM  [facility] Office  [phone]

## 2012-08-13 DIAGNOSIS — W19XXXA Unspecified fall, initial encounter: Secondary | ICD-10-CM

## 2012-08-13 DIAGNOSIS — D638 Anemia in other chronic diseases classified elsewhere: Secondary | ICD-10-CM

## 2012-08-13 DIAGNOSIS — G9341 Metabolic encephalopathy: Secondary | ICD-10-CM

## 2012-08-13 DIAGNOSIS — N179 Acute kidney failure, unspecified: Secondary | ICD-10-CM

## 2012-08-13 MED ORDER — COLLAGENASE 250 UNIT/GM EX OINT: TOPICAL_OINTMENT | Freq: Every day | CUTANEOUS | Status: AC

## 2012-08-13 MED ORDER — ATENOLOL 25 MG PO TABS: 25.0000 mg | ORAL_TABLET | Freq: Every day | ORAL | Status: DC

## 2012-08-13 MED ORDER — GABAPENTIN 300 MG PO CAPS: 300.0000 mg | ORAL_CAPSULE | Freq: Every day | ORAL | Status: DC

## 2012-08-13 NOTE — Progress Notes (Signed)
Occupational Therapy Treatment Patient Details Name: [name] [name] MRN: [id number] DOB: September 24, 1919 Today's Date: 08/13/2012 Time:  -     OT Assessment / Plan / Recommendation Comments on Treatment Session better participation and communicated needs better today.  Pt reports difficulty with vision.  Did not notice functionally but she reports she can't see buttons on call light:soft touch requested)    Follow Up Recommendations  Skilled nursing facility    Barriers to Discharge       Equipment Recommendations  Defer to next venue    Recommendations for Other Services    Frequency Min 1X/week   Plan      Precautions / Restrictions Precautions Precautions: Fall Restrictions Weight Bearing Restrictions: No   Pertinent Vitals/Pain Pain present in LUE    ADL  Eating/Feeding: Performed;Minimal assistance (positioning items) Where Assessed - Eating/Feeding: Bed level Upper Body Bathing: Performed;Minimal assistance Where Assessed - Upper Body Bathing: Supine, head of bed up Where Assessed - Upper Body Dressing: Supine, head of bed up ADL Comments: Pt reports difficulty with vision:  able to reach for items on tray but states that she can't see buttons/colors on call light:  asked for soft touch button.  Pt has difficulty moving L arm for adls:  actively abducts about 30 degrees and flexes about 40    OT Diagnosis:    OT Problem List:   OT Treatment Interventions:     OT Goals ADL Goals Pt Will Perform Upper Body Bathing: with min assist;Sitting, chair;Supported ADL Goal: Product manager - Progress: Progressing toward goals Pt Will Perform Upper Body Dressing: with min assist;Sitting, chair;Supported ADL Goal: Location manager Dressing - Progress: Progressing toward goals Miscellaneous OT Goals Miscellaneous OT Goal #1: Pt will perform 10 reps of aarom to increase strength endurance for adls without rest break OT Goal: Miscellaneous Goal #1 - Progress: Met  Visit  Information  Assistance Needed: +2 (mobility)    Subjective Data      Prior Functioning       Cognition  Overall Cognitive Status: Appears within functional limits for tasks assessed/performed Difficult to assess due to: Hard of hearing/deaf Arousal/Alertness: Awake/alert Behavior During Session: [facility] for tasks performed    Mobility  Shoulder Instructions         Exercises      Balance     End of Session OT - End of Session Activity Tolerance: Patient tolerated treatment well Patient left: in bed;with call bell/phone within reach  GO     Yicel Shannon 08/13/2012, 3:58 PM Marica Otter, OTR/L [phone] 08/13/2012

## 2012-08-13 NOTE — Progress Notes (Signed)
Auth received from [place]. Patient accepted at [facility] place. Packet in [place].  Anikka Marsan C. Jhovani Griswold MSW, LCSW [phone]

## 2012-08-13 NOTE — Progress Notes (Signed)
Physical Therapy Treatment Patient Details Name: [name] [name] MRN: [id number] DOB: 07-14-1919 Today's Date: 08/13/2012 Time: 1005-1030 PT Time Calculation (min): 25 min  PT Assessment / Plan / Recommendation Comments on Treatment Session  Pt progressing slowly and still requires + 2 total assist for mobility/transfers.  Prior resides @ [facility] [place]. Will need 24/7 assist and would benefit from SNF Rehab to regain prior level of mobility.    Follow Up Recommendations  Skilled nursing facility    Barriers to Discharge        Equipment Recommendations  Defer to next venue    Recommendations for Other Services    Frequency Min 3X/week   Plan Discharge plan remains appropriate    Precautions / Restrictions Precautions Precautions: Fall Precaution Comments: sacral decub and poor kyphotic posture Restrictions Weight Bearing Restrictions: No    Pertinent Vitals/Pain No c/o pain    Mobility  Bed Mobility Bed Mobility: Sit to Supine Sit to Supine: 1: +2 Total assist Sit to Supine: Patient Percentage: 0% Details for Bed Mobility Assistance: Assisted pt back to bed + 2 total assist, pt unable to physical assist (pt 0%) Transfers Transfers: Stand Pivot Transfers Stand Pivot Transfers: 1: +2 Total assist Stand Pivot Transfers: Patient Percentage: 20% (partial completion) Details for Transfer Assistance: Assisted pt from recliner to [facility] to bed @ + 2 total assist with 75% VC's on proper tech and hand placement.  Great difficulty pushing self up and completing turns.  Required total assist to complete.  HIGH FALL RISK. Ambulation/Gait Ambulation/Gait Assistance:  (unable) Ambulation/Gait Assistance Details: Attempted amb however pt unable to functionally take steps and support self due to weakness.     PT Goals                     progressing    Visit Information  Last PT Received On: 08/13/12 Assistance Needed: +2    Subjective Data      Cognition  Overall Cognitive  Status: Appears within functional limits for tasks assessed/performed Difficult to assess due to: Hard of hearing/deaf Arousal/Alertness: Awake/alert Behavior During Session: [facility] for tasks performed    Balance   poor  End of Session PT - End of Session Equipment Utilized During Treatment: Gait belt Activity Tolerance: Patient limited by fatigue Patient left: in bed;with call bell/phone within reach Nurse Communication: Mobility status   Felecia Shelling  PTA [facility]  Acute  Rehab Pager     [phone]

## 2012-08-13 NOTE — Progress Notes (Signed)
Report given to the nurse at [facility] place, all questions and concerns were addressed, pt changed into paper scrubs, son notified of pt d/c, will continue to monitor until picked up by [facility].

## 2012-08-13 NOTE — Discharge Summary (Signed)
Physician Discharge Summary  Arda Daggs ZOX:[id number] DOB: October 18, 1919 DOA: 08/06/2012  PCP: Estill Cotta, MD  Admit date: 08/06/2012 Discharge date: 08/13/2012  THIS IS AN UPDATED DISCHARGE SUMMARY TO 9/3 SUMMARY PREPARED BY DR. Thedore Mins.  Recommendations for Outpatient Follow-up:  1. Follow-up SNF for short-term rehab 2. Follow-up wound care for sacral decubitis 3. Follow-up mild elevation of LFTs (  Follow-up Information    Follow up with Letitia Libra, Ala Dach, MD. Schedule an appointment as soon as possible for a visit in 1 week.   Contact information:   [address] [place] [address] [phone]         Discharge Diagnoses:  1. Multiple mechanical falls prior to admission 2. Acute encephalopathy, resolved 3. E. Coli UTI, treated 4. Unstageable sacral decubitus present on admission 5. Acute renal failure 6. Mild elevation of AP, AST, ALT  Discharge Condition: improved  Disposition: SNF for short-term rehabilitation  Diet recommendation: heart healthy  Filed Weights   08/06/12 2253  Weight: 52.7 kg (116 lb 2.9 oz)   History of present illness:  78 year old female resides at [facility]. She fell on Monday and was seen to the ER at [facility]. She was evaluated and sent back to nursing home. On day of admission she again had an unwitnessed fall. She was found down in her apartment. She was transported to the ER. Patient has been extremely lethargic and disoriented. Patient is on Neurontin, this was doubled approximately 3 weeks ago. Approximately 2 weeks ago she was noted to be more lethargic. For this reason her Neurontin was brought back down to 300 mg at night, however, patient lethargy continued. She has a poor appetite, not eating or drinking much. There is no report of any fever, chills, nausea, diarrhea, vomiting. She was noted to have sacral decubitus which per family's is new. She has become more  immobile within recent weeks, she was brought to the ER because of the fall.  Seen in ED 8/27 for fall and discharged. Per PCP last medication increased was Neurontin, which was then decreased to 300mg  from 600mg  QHS.  Hospital Course:  Ms. Hoefer was admitted for further evaluation of multiple falls, confusion, lethary. She was seen by physical therapy and SNF was recommended. Encephalopathy resolved, and was likely multifactorial as below. UTI was treated. Other acute and chronic issues as outlined below. 1. S/p multiple mechanical falls--Suspect multifactorial ADR (Neurontin), UTI. Imaging negative. PT recommended SNF for continued physical therapy and balance assistance (requires +2 assistance, not walking).  2. Encephalopathy--resolved; secondary Neurontin at higher dose, UTI. Note has been tolerating Neurontin 300mg  QHS as inpatient.  3. E. coli UTI--treated with course of Rocephin.  4. Unstageable sacral decubitus present on admission--no sign of infection. Continue wound care.  5. Acute renal failure--resolved with IVF; secondary to dehydration. Hold Lasix for now. 6. Depression with anxiety--stable.  7. Anemia of chronic disease--stable.  8. Hypothyroidism--TSH normal. Continue Synthroid.  9. HTN--stable. Mild bradycardia--decrease atenolol.  10. Mild elevation of AP, AST, ALT--asymptomatic. Followup as an outpatient.  11. Legally blind  Consultants:  Wound care--enzymatic debridement agent to begin debridement process, this area is still quite adherent therefore I do not think hydrotherapy will be of benefit until this loosens a bit. Daily application of Santyl, cover with dry dressing. Turn from side to side at least every two hours and I will add LALM for pressure redistribution and moisture management.     PT--SNF  OT--SNF   ST--regular diet, thin liquids  Procedures:  None  Discharge Instructions  Discharge Orders    Future Appointments: Provider: Department:  Dept Phone: Center:   08/15/2012 3:30 PM Edwyna Perfect, MD [facility] [phone] [facility]   10/27/2012 2:00 PM Sherrie George, MD [facility] [phone] None     Future Orders Please Complete By Expires   Diet - low sodium heart healthy      Diet - low sodium heart healthy      Increase activity slowly      Increase activity slowly        Medication List  As of 08/13/2012  4:25 PM   STOP taking these medications         furosemide 20 MG tablet      HYDROcodone-acetaminophen 5-500 MG per tablet      LORazepam 0.5 MG tablet      meloxicam 15 MG tablet         TAKE these medications         acetaminophen 325 MG tablet   Commonly known as: TYLENOL   Take 650 mg by mouth every 6 (six) hours as needed. For pain.      amLODipine 10 MG tablet   Commonly known as: NORVASC   Take 1 tablet (10 mg total) by mouth daily.      atenolol 25 MG tablet   Commonly known as: TENORMIN   Take 1 tablet (25 mg total) by mouth daily.      bisacodyl 5 MG EC tablet   Commonly known as: DULCOLAX   Take 5 mg by mouth daily.      collagenase ointment   Commonly known as: SANTYL   Apply topically daily. Daily application of Santyl, cover with dry dressing.      ENSURE   Take 237 mLs by mouth 3 (three) times daily between meals. Ensure butter/pecan liquid      fluticasone 50 MCG/ACT nasal spray   Commonly known as: FLONASE   Place 2 sprays into the nose daily.      gabapentin 300 MG capsule   Commonly known as: NEURONTIN   Take 1 capsule (300 mg total) by mouth at bedtime.      hydrALAZINE 10 MG tablet   Commonly known as: APRESOLINE   Take 1 tablet (10 mg total) by mouth every 8 (eight) hours.      levothyroxine 88 MCG tablet   Commonly known as: SYNTHROID, LEVOTHROID   Take 88 mcg by mouth daily.      lidocaine 5 %   Commonly known as: LIDODERM   Place 0.25 patches onto the skin daily. Remove & Discard patch within 12 hours or as directed by MD; applied to  neck.      magnesium oxide 400 MG tablet   Commonly known as: MAG-OX   Take 1 tablet (400 mg total) by mouth daily.      omeprazole 40 MG capsule   Commonly known as: PRILOSEC   Take 1 capsule (40 mg total) by mouth daily.      PRESERVISION AREDS 2 PO   Take 1 tablet by mouth daily.      rOPINIRole 1 MG tablet   Commonly known as: REQUIP   Take 1-2 mg by mouth 4 (four) times daily. 1mg  at 2AM; 2mg  at 8AM; 1mg  at [facility]; 2mg  at 8PM      sertraline 100 MG tablet   Commonly known as: ZOLOFT   Take  1 tablet (100 mg total) by mouth daily.      vitamin B-12 1000 MCG tablet   Commonly known as: CYANOCOBALAMIN   Take 1,000 mcg by mouth daily.           The results of significant diagnostics from this hospitalization (including imaging, microbiology, ancillary and laboratory) are listed below for reference.    Significant Diagnostic Studies: Dg Pelvis 1-2 Views  08/06/2012  *RADIOLOGY REPORT*  Clinical Data: Fall, pain.  PELVIS - 1-2 VIEW  Comparison: CT of the pelvis 04/07/2012.  Findings: The hips are located.  No fracture is identified.  No notable degenerative change about the hips is seen.  Soft tissue structures are unremarkable. Bones appear osteopenic.  IMPRESSION: No acute finding.   Original Report Authenticated By: Bernadene Bell. Maricela Curet, M.D.    Ct Head Wo Contrast  08/06/2012  *RADIOLOGY REPORT*  Clinical Data:  Unwitnessed fall.  The patient was found lying on her side.  CT HEAD WITHOUT CONTRAST CT CERVICAL SPINE WITHOUT CONTRAST  Technique:  Multidetector CT imaging of the head and cervical spine was performed following the standard protocol without intravenous contrast.  Multiplanar CT image reconstructions of the cervical spine were also generated.  Comparison:  CT head and cervical spine 08/04/2012.  CT HEAD  Findings: No acute cortical infarct, hemorrhage, or mass lesion is present.  Moderate generalized atrophy and diffuse white matter disease is similar to the prior study.   The ventricles are proportionate to the degree of atrophy.  No significant extra-axial fluid collection is present.  The paranasal sinuses and mastoid air cells are clear.  No significant extracranial soft tissue injury is evident.  The osseous skull is intact.  Atherosclerotic calcifications are again seen.  IMPRESSION:  1.  No acute intracranial abnormality or significant interval change. 2.  Stable atrophy and diffuse white matter disease. 3.  Atherosclerosis.  CT CERVICAL SPINE  Findings: The cervical spine is imaged from skull base through T2- 3.  Slight degenerative anterolisthesis at C4-5 and C5-6 is stable. No acute fracture or traumatic subluxation is evident.  The soft tissues are unremarkable.  Biapical pleural parenchymal scarring is present.  IMPRESSION:  1.  Stable degenerative changes of the cervical spine. 2.  No acute fracture or traumatic subluxation.   Original Report Authenticated By: Jamesetta Orleans. MATTERN, M.D.    Microbiology: Recent Results (from the past 240 hour(s))  MRSA PCR SCREENING     Status: Normal   Collection Time   08/07/12 12:00 AM      Component Value Range Status Comment   MRSA by PCR NEGATIVE  NEGATIVE Final   URINE CULTURE     Status: Normal   Collection Time   08/07/12 11:53 AM      Component Value Range Status Comment   Specimen Description URINE, CLEAN CATCH   Final    Special Requests rocephin   Final    Culture  Setup Time 08/07/2012 23:31   Final    Colony Count >=100,000 COLONIES/ML   Final    Culture ESCHERICHIA COLI   Final    Report Status 08/09/2012 FINAL   Final    Organism ID, Bacteria ESCHERICHIA COLI   Final     Labs: Basic Metabolic Panel:  Lab 08/13/12 7829 08/08/12 0450 08/07/12 0450 08/06/12 2150  NA -- 142 141 139  K -- 3.6 3.1* 3.2*  CL -- 111 107 103  CO2 -- 22 26 26   GLUCOSE -- 95 94 149*  BUN --  27* 39* 43*  CREATININE 1.14* 1.31* 1.70* 1.72*  CALCIUM -- 8.7 8.3* 8.4  MG -- 2.3 -- --  PHOS -- -- -- --   Liver Function  Tests:  Lab 08/06/12 2150  AST 43*  ALT 42*  ALKPHOS 502*  BILITOT 1.2  PROT 7.0  ALBUMIN 2.6*   CBC:  Lab 08/08/12 0450 08/07/12 0450 08/06/12 1935  WBC 9.7 10.2 11.8*  NEUTROABS -- -- 9.7*  HGB 9.6* 9.1* 11.1*  HCT 27.7* 26.8* 33.1*  MCV 92.6 92.7 92.7  PLT 260 243 207    Principal Problem:  *Fall Active Problems:  Depression with anxiety  Metabolic encephalopathy  UTI (lower urinary tract infection)  Decubitus ulcer of sacral region, unstageable  Acute renal failure  Anemia of chronic disease  Hypothyroidism  HTN (hypertension)  CKD (chronic kidney disease) stage 2, GFR 60-89 ml/min   Time coordinating discharge: 40 minutes  Signed:  Brendia Sacks, MD [facility] 08/13/2012, 4:25 PM

## 2012-08-13 NOTE — Progress Notes (Addendum)
Faxed today's MD prog note and PT note.  LM for [facility] at [place].  Notified [name] and Malvin Johns of the situation.  Providence Crosby, LCSWA Clinical Social Work [phone]

## 2012-08-13 NOTE — Progress Notes (Signed)
Notified [facility] for transport to [facility]

## 2012-08-13 NOTE — Progress Notes (Addendum)
[facility] PROGRESS NOTE  Beautiful Pensyl JYN:[id number] DOB: 28-Aug-1919 DOA: 08/06/2012 PCP: Letitia Libra, Ala Dach, MD  Assessment/Plan: 1. S/p multiple mechanical falls--Suspect multifactorial ADR (Neurontin), UTI. Imaging negative. PT recommended SNF for continued physical therapy and balance assistance (requires +2 assistance, not walking). 2. Encephalopathy--resolved, secondary Neurontin, UTI. 3. E. coli UTI--treated with course of Rocephin. 4. Unstageable sacral decubitus present on admission with surrounding cellulitis--no sign of infection. Continue wound care. 5. Acute renal failure--resolved with IVF; secondary to dehydration. 6. Depression with anxiety--stable. 7. Anemia of chronic disease--stable. 8. Hypothyroidism--TSH normal. Continue Synthroid. 9. HTN--stable. Mild bradycardia--decrease atenolol. 10. Mild elevation of AP, AST, ALT--asymptomatic. Followup as an outpatient. 11. Legally blind  Medically stable for transfer today. History of multiple falls--SNF recommended, discussed with PT. Await insurance authorization/final decision.  Code Status: DNR Family Communication: discussed with son Franky Macho by telephone today Disposition Plan: SNF vs. ALF today  Brendia Sacks, MD  [facility] Team 5 Pager [phone]. If 7PM-7AM, please contact night-coverage at [email or web address], password [facility] 08/13/2012, 9:50 AM  LOS: 7 days   Brief narrative: 78 year old female resides at [facility]. She fell on Monday and was seen to the ER at [facility]. She was evaluated and sent back to nursing home. On day of admission she again had an unwitnessed fall. She was found down in her apartment. She was transported to the ER. Patient has been extremely lethargic and disoriented. Patient is on Neurontin, this was doubled approximately 3 weeks ago. Approximately 2 weeks ago she was noted to be more lethargic. For this reason her Neurontin was brought back down  to 300 mg at night, however, patient lethargy continued. She has a poor appetite, not eating or drinking much. There is no report of any fever, chills, nausea, diarrhea, vomiting. She was noted to have sacral decubitus which per family's is new. She has become more immobile within recent weeks, she was brought to the ER because of the fall.  Seen in ED 8/27 for fall and discharged. Per PCP last medication increased was Neurontin, which was then decreased to 300mg  from 600mg  QHS.  Consultants:  Wound care--enzymatic debridement agent to begin debridement process, this area is still quite adherent therefore I do not think hydrotherapy will be of benefit until this loosens a bit. Daily application of Santyl, cover with dry dressing. Turn from side to side at least every two hours and I will add LALM for pressure redistribution and moisture management.   PT--SNF  OT--SNF  ST--regular diet, thin liquids  Procedures:  None  HPI/Subjective: No n/v. Eating ok. Complains of sacral pain from ulcer.  Objective: Filed Vitals:   08/12/12 0512 08/12/12 0824 08/12/12 1351 08/12/12 2200  BP: 133/46 138/67 114/64 138/60  Pulse: 58  58 54  Temp: 97.5 F (36.4 C)  98.5 F (36.9 C) 98.6 F (37 C)  TempSrc: Oral  Oral Oral  Resp: 20  20 18   Height:      Weight:      SpO2: 98%  94% 92%    Intake/Output Summary (Last 24 hours) at 08/13/12 0950 Last data filed at 08/13/12 0900  Gross per 24 hour  Intake    330 ml  Output    300 ml  Net     30 ml   Filed Weights   08/06/12 2253  Weight: 52.7 kg (116 lb 2.9 oz)    Exam:   General:  Appears calm and comfortable. Marked kyphosis with chin on chest. Non-toxic  Cardiovascular: RRR, 2/6 systolic murmur, no r/g. No LE edema.  Respiratory: CTA bilaterally, no w/r/r. Normal respiratory effort.  Abdomen: soft, ntnd  Psych: speech fluent and clear; alert, oriented to self, month, year, president; hospital (with prompting)  Skin: unstageable  sacral ulcer noted; no erythema, edema, exudate  Data Reviewed: Basic Metabolic Panel:  Lab 08/13/12 1610 08/08/12 0450 08/07/12 0450 08/06/12 2150  NA -- 142 141 139  K -- 3.6 3.1* 3.2*  CL -- 111 107 103  CO2 -- 22 26 26   GLUCOSE -- 95 94 149*  BUN -- 27* 39* 43*  CREATININE 1.14* 1.31* 1.70* 1.72*  CALCIUM -- 8.7 8.3* 8.4  MG -- 2.3 -- --  PHOS -- -- -- --   Liver Function Tests:  Lab 08/06/12 2150  AST 43*  ALT 42*  ALKPHOS 502*  BILITOT 1.2  PROT 7.0  ALBUMIN 2.6*   CBC:  Lab 08/08/12 0450 08/07/12 0450 08/06/12 1935  WBC 9.7 10.2 11.8*  NEUTROABS -- -- 9.7*  HGB 9.6* 9.1* 11.1*  HCT 27.7* 26.8* 33.1*  MCV 92.6 92.7 92.7  PLT 260 243 207   Recent Results (from the past 240 hour(s))  MRSA PCR SCREENING     Status: Normal   Collection Time   08/07/12 12:00 AM      Component Value Range Status Comment   MRSA by PCR NEGATIVE  NEGATIVE Final   URINE CULTURE     Status: Normal   Collection Time   08/07/12 11:53 AM      Component Value Range Status Comment   Specimen Description URINE, CLEAN CATCH   Final    Special Requests rocephin   Final    Culture  Setup Time 08/07/2012 23:31   Final    Colony Count >=100,000 COLONIES/ML   Final    Culture ESCHERICHIA COLI   Final    Report Status 08/09/2012 FINAL   Final    Organism ID, Bacteria ESCHERICHIA COLI   Final      Studies: No results found.  Scheduled Meds:   . amLODipine  10 mg Oral Daily  . atenolol  50 mg Oral Daily  . bisacodyl  5 mg Oral Daily  . collagenase   Topical Daily  . docusate sodium  100 mg Oral Daily  . enoxaparin (LOVENOX) injection  30 mg Subcutaneous Q24H  . feeding supplement  237 mL Oral TID BM  . fluticasone  2 spray Each Nare Daily  . gabapentin  300 mg Oral QHS  . hydrALAZINE  10 mg Oral Q8H  . levothyroxine  88 mcg Oral Daily  . magnesium hydroxide  30 mL Oral Once  . magnesium oxide  400 mg Oral Daily  . pantoprazole  40 mg Oral Q1200  . rOPINIRole  1 mg Oral Q12H  .  rOPINIRole  2 mg Oral Q12H  . senna  1 tablet Oral Daily  . sertraline  100 mg Oral Daily  . vitamin B-12  1,000 mcg Oral Daily  . DISCONTD: hydrALAZINE  25 mg Oral Q8H   Continuous Infusions:   Principal Problem:  *Fall Active Problems:  Hypothyroidism  Depression with anxiety  RLS (restless legs syndrome)  HTN (hypertension)  Hypokalemia  Metabolic encephalopathy  CKD (chronic kidney disease) stage 2, GFR 60-89 ml/min  Constipation  UTI (lower urinary tract infection)  Decubitus ulcer of sacral region, unstageable     Brendia Sacks, MD  [facility] Team 5 Pager [phone]. If 7PM-7AM, please contact night-coverage at  [email or web address], password [facility] 08/13/2012, 9:50 AM  LOS: 7 days   Time spent: 25 minutes

## 2012-08-14 ENCOUNTER — Ambulatory Visit: Payer: Self-pay | Admitting: Internal Medicine

## 2012-08-15 ENCOUNTER — Ambulatory Visit: Payer: Self-pay | Admitting: Internal Medicine

## 2012-08-19 ENCOUNTER — Ambulatory Visit: Payer: Self-pay | Admitting: Internal Medicine

## 2012-09-05 ENCOUNTER — Telehealth: Payer: Self-pay | Admitting: *Deleted

## 2012-09-05 NOTE — Telephone Encounter (Signed)
Received fax from [facility] stating that patient has returned to their care today [09.27.13], and they are requesting confirmation on medications, whether to continue or discontinue. Called [facility] and informed that the fax recvd is not readable [medications names have been cut off of fax received and that we need this information refaxed legibly; also that they will need to adhere to the Discharge summary of medications given to them from facility that she was released from into their care until we can respond next week to their request; understood & agreed/SLS

## 2012-09-09 ENCOUNTER — Encounter: Payer: Self-pay | Admitting: Internal Medicine

## 2012-09-09 ENCOUNTER — Ambulatory Visit (INDEPENDENT_AMBULATORY_CARE_PROVIDER_SITE_OTHER): Payer: Medicare Other | Admitting: Internal Medicine

## 2012-09-09 VITALS — BP 100/68 | HR 55 | Temp 97.7°F | Resp 12 | Wt 109.0 lb

## 2012-09-09 DIAGNOSIS — Z79899 Other long term (current) drug therapy: Secondary | ICD-10-CM

## 2012-09-09 DIAGNOSIS — N179 Acute kidney failure, unspecified: Secondary | ICD-10-CM

## 2012-09-09 DIAGNOSIS — R197 Diarrhea, unspecified: Secondary | ICD-10-CM

## 2012-09-09 DIAGNOSIS — D649 Anemia, unspecified: Secondary | ICD-10-CM

## 2012-09-09 DIAGNOSIS — N390 Urinary tract infection, site not specified: Secondary | ICD-10-CM

## 2012-09-09 MED ORDER — CIPROFLOXACIN HCL 250 MG PO TABS: 250.0000 mg | ORAL_TABLET | Freq: Two times a day (BID) | ORAL | Status: DC

## 2012-09-09 MED ORDER — CEFTRIAXONE SODIUM 500 MG IJ SOLR
1000.0000 mg | Freq: Once | INTRAMUSCULAR | Status: AC
  Administered 2012-09-09: 1000 mg via INTRAMUSCULAR

## 2012-09-09 NOTE — Progress Notes (Signed)
  Subjective:    Patient ID: [name] [name], female    DOB: [birth year], 78 y.o.   MRN: [id number]  HPI Pt presents to clinic for followup of multiple medical problems. Patient brought in by her family with concerns over possible UTI. Was hospitalized last month with Escherichia coli UTI pansensitive and was treated with Rocephin. At that time had mental status changes which resolved. Her the past several days has been appearing mildly disoriented. Urinalysis suggestive of UTI obtain that facility. Culture pending. Also has sacral decubitus which is difficult to assess today in clinic at the facility is attempting to reduce sitting time and friction from skin. Family notes several day history of loose watery stools without obvious blood. Has had recent antibiotics, hospitalization and resides in the facility. No known exposure to C. difficile.  Past Medical History  Diagnosis Date  . Arthritis   . History of chicken pox   . Depression     husband died 09-11-2011  . Glaucoma   . Thyroid disease   . Hypertension   . Hypokalemia   . Renal disorder   . Altered mental state    Past Surgical History  Procedure Date  . Revision total knee arthroplasty     right knee  . Back surgery   . Cataract extraction, bilateral   . Thyroidectomy   . Abdominal hysterectomy     reports that she has never smoked. She has never used smokeless tobacco. She reports that she does not drink alcohol or use illicit drugs. family history includes Hypertension in an unspecified family member. Allergies  Allergen Reactions  . Codeine Other (See Comments)    insomnia  . Doxycycline Diarrhea and Other (See Comments)    Hallucinations  . Fentanyl Hives and Rash  . Lisinopril Nausea And Vomiting, Rash and Hypertension      Review of Systems see history of present illness     Objective:   Physical Exam  Nursing note and vitals reviewed. Constitutional: She appears well-developed and well-nourished. No  distress.  HENT:  Head: Normocephalic and atraumatic.  Abdominal: Soft. She exhibits no distension and no mass. There is no tenderness. There is no rebound and no guarding.  Neurological: She is alert.  Skin: Skin is warm and dry. She is not diaphoretic.          Assessment & Plan:

## 2012-09-09 NOTE — Assessment & Plan Note (Signed)
Obtain follow up CBC.  

## 2012-09-09 NOTE — Assessment & Plan Note (Signed)
Given Rocephin 1 g IM injection today. Begin Cipro 250 mg twice a day or seven days. Follow closely if no improvement or worsening. Urine culture pending at facility

## 2012-09-09 NOTE — Assessment & Plan Note (Signed)
Followup Chem-7

## 2012-09-09 NOTE — Assessment & Plan Note (Signed)
Given recent antibiotics and hospitalization obtain stool studies for fecal leukocytes, culture, O. and P. as well as C. difficile toxin

## 2012-09-10 LAB — CBC WITH DIFFERENTIAL/PLATELET
Basophils Absolute: 0.1 10*3/uL (ref 0.0–0.1)
Basophils Relative: 0 % (ref 0–1)
Eosinophils Absolute: 0.2 10*3/uL (ref 0.0–0.7)
HCT: 30 % — ABNORMAL LOW (ref 36.0–46.0)
MCHC: 36 g/dL (ref 30.0–36.0)
Monocytes Absolute: 1.3 10*3/uL — ABNORMAL HIGH (ref 0.1–1.0)
Neutro Abs: 19 10*3/uL — ABNORMAL HIGH (ref 1.7–7.7)
RDW: 14.2 % (ref 11.5–15.5)

## 2012-09-10 LAB — HEPATIC FUNCTION PANEL
ALT: 23 U/L (ref 0–35)
Albumin: 2.8 g/dL — ABNORMAL LOW (ref 3.5–5.2)
Bilirubin, Direct: 0.4 mg/dL — ABNORMAL HIGH (ref 0.0–0.3)
Total Protein: 6.5 g/dL (ref 6.0–8.3)

## 2012-09-10 LAB — BASIC METABOLIC PANEL
CO2: 18 mEq/L — ABNORMAL LOW (ref 19–32)
Calcium: 8.1 mg/dL — ABNORMAL LOW (ref 8.4–10.5)
Creat: 1.74 mg/dL — ABNORMAL HIGH (ref 0.50–1.10)
Glucose, Bld: 92 mg/dL (ref 70–99)
Sodium: 134 mEq/L — ABNORMAL LOW (ref 135–145)

## 2012-09-11 ENCOUNTER — Telehealth: Payer: Self-pay | Admitting: *Deleted

## 2012-09-11 ENCOUNTER — Emergency Department (HOSPITAL_COMMUNITY): Payer: Medicare Other

## 2012-09-11 ENCOUNTER — Encounter (HOSPITAL_COMMUNITY): Payer: Self-pay | Admitting: Emergency Medicine

## 2012-09-11 ENCOUNTER — Inpatient Hospital Stay (HOSPITAL_COMMUNITY)
Admission: EM | Admit: 2012-09-11 | Discharge: 2012-09-17 | DRG: 871 | Disposition: A | Discharge status: Nursing Home (Includes ICF) | Payer: Medicare Other | Attending: Internal Medicine | Admitting: Internal Medicine

## 2012-09-11 DIAGNOSIS — Z96659 Presence of unspecified artificial knee joint: Secondary | ICD-10-CM

## 2012-09-11 DIAGNOSIS — R627 Adult failure to thrive: Secondary | ICD-10-CM | POA: Diagnosis present

## 2012-09-11 DIAGNOSIS — N390 Urinary tract infection, site not specified: Secondary | ICD-10-CM | POA: Diagnosis present

## 2012-09-11 DIAGNOSIS — I10 Essential (primary) hypertension: Secondary | ICD-10-CM | POA: Diagnosis present

## 2012-09-11 DIAGNOSIS — L8993 Pressure ulcer of unspecified site, stage 3: Secondary | ICD-10-CM | POA: Diagnosis present

## 2012-09-11 DIAGNOSIS — N289 Disorder of kidney and ureter, unspecified: Secondary | ICD-10-CM

## 2012-09-11 DIAGNOSIS — E43 Unspecified severe protein-calorie malnutrition: Secondary | ICD-10-CM | POA: Diagnosis present

## 2012-09-11 DIAGNOSIS — E039 Hypothyroidism, unspecified: Secondary | ICD-10-CM | POA: Diagnosis present

## 2012-09-11 DIAGNOSIS — G9341 Metabolic encephalopathy: Secondary | ICD-10-CM | POA: Diagnosis present

## 2012-09-11 DIAGNOSIS — IMO0002 Reserved for concepts with insufficient information to code with codable children: Secondary | ICD-10-CM

## 2012-09-11 DIAGNOSIS — A419 Sepsis, unspecified organism: Principal | ICD-10-CM | POA: Diagnosis present

## 2012-09-11 DIAGNOSIS — F418 Other specified anxiety disorders: Secondary | ICD-10-CM | POA: Diagnosis present

## 2012-09-11 DIAGNOSIS — Z66 Do not resuscitate: Secondary | ICD-10-CM | POA: Diagnosis present

## 2012-09-11 DIAGNOSIS — L89153 Pressure ulcer of sacral region, stage 3: Secondary | ICD-10-CM | POA: Diagnosis present

## 2012-09-11 DIAGNOSIS — N179 Acute kidney failure, unspecified: Secondary | ICD-10-CM | POA: Diagnosis present

## 2012-09-11 DIAGNOSIS — Z79899 Other long term (current) drug therapy: Secondary | ICD-10-CM

## 2012-09-11 DIAGNOSIS — F3289 Other specified depressive episodes: Secondary | ICD-10-CM | POA: Diagnosis present

## 2012-09-11 DIAGNOSIS — A0472 Enterocolitis due to Clostridium difficile, not specified as recurrent: Secondary | ICD-10-CM | POA: Diagnosis present

## 2012-09-11 DIAGNOSIS — D72829 Elevated white blood cell count, unspecified: Secondary | ICD-10-CM | POA: Diagnosis present

## 2012-09-11 DIAGNOSIS — F341 Dysthymic disorder: Secondary | ICD-10-CM

## 2012-09-11 DIAGNOSIS — F411 Generalized anxiety disorder: Secondary | ICD-10-CM | POA: Diagnosis present

## 2012-09-11 DIAGNOSIS — I129 Hypertensive chronic kidney disease with stage 1 through stage 4 chronic kidney disease, or unspecified chronic kidney disease: Secondary | ICD-10-CM | POA: Diagnosis present

## 2012-09-11 DIAGNOSIS — L89109 Pressure ulcer of unspecified part of back, unspecified stage: Secondary | ICD-10-CM | POA: Diagnosis present

## 2012-09-11 DIAGNOSIS — D638 Anemia in other chronic diseases classified elsewhere: Secondary | ICD-10-CM | POA: Diagnosis present

## 2012-09-11 DIAGNOSIS — F329 Major depressive disorder, single episode, unspecified: Secondary | ICD-10-CM | POA: Diagnosis present

## 2012-09-11 DIAGNOSIS — N189 Chronic kidney disease, unspecified: Secondary | ICD-10-CM | POA: Diagnosis present

## 2012-09-11 HISTORY — DX: Unspecified macular degeneration: H35.30

## 2012-09-11 HISTORY — DX: Unspecified visual loss: H54.7

## 2012-09-11 LAB — URINALYSIS, ROUTINE W REFLEX MICROSCOPIC
Glucose, UA: NEGATIVE mg/dL
Nitrite: NEGATIVE
Protein, ur: NEGATIVE mg/dL
pH: 5.5 (ref 5.0–8.0)

## 2012-09-11 LAB — COMPREHENSIVE METABOLIC PANEL
AST: 17 U/L (ref 0–37)
CO2: 20 mEq/L (ref 19–32)
Calcium: 7.7 mg/dL — ABNORMAL LOW (ref 8.4–10.5)
Creatinine, Ser: 1.42 mg/dL — ABNORMAL HIGH (ref 0.50–1.10)
GFR calc non Af Amer: 31 mL/min — ABNORMAL LOW (ref >90)

## 2012-09-11 LAB — CBC WITH DIFFERENTIAL/PLATELET
Basophils Absolute: 0 10*3/uL (ref 0.0–0.1)
Eosinophils Relative: 1 % (ref 0–5)
Lymphocytes Relative: 2 % — ABNORMAL LOW (ref 12–46)
MCV: 90.5 fL (ref 78.0–100.0)
Monocytes Relative: 4 % (ref 3–12)
Neutrophils Relative %: 93 % — ABNORMAL HIGH (ref 43–77)
Platelets: 332 10*3/uL (ref 150–400)
RBC: 3.16 MIL/uL — ABNORMAL LOW (ref 3.87–5.11)
WBC: 31.1 10*3/uL — ABNORMAL HIGH (ref 4.0–10.5)

## 2012-09-11 LAB — URINE MICROSCOPIC-ADD ON

## 2012-09-11 LAB — BASIC METABOLIC PANEL
CO2: 19 mEq/L (ref 19–32)
Chloride: 99 mEq/L (ref 96–112)
GFR calc Af Amer: 32 mL/min — ABNORMAL LOW (ref >90)
Sodium: 132 mEq/L — ABNORMAL LOW (ref 135–145)

## 2012-09-11 LAB — MAGNESIUM: Magnesium: 2.2 mg/dL (ref 1.5–2.5)

## 2012-09-11 LAB — PHOSPHORUS: Phosphorus: 2.7 mg/dL (ref 2.3–4.6)

## 2012-09-11 LAB — APTT: aPTT: 35 seconds (ref 24–37)

## 2012-09-11 MED ORDER — DEXTROSE 5 % IV SOLN
1.0000 g | INTRAVENOUS | Status: DC
  Administered 2012-09-12 – 2012-09-14 (×3): 1 g via INTRAVENOUS
  Filled 2012-09-11 (×3): qty 10

## 2012-09-11 MED ORDER — ONDANSETRON HCL 4 MG/2ML IJ SOLN: 4.0000 mg | Freq: Four times a day (QID) | INTRAMUSCULAR | Status: DC | PRN

## 2012-09-11 MED ORDER — DEXTROSE 5 % IV SOLN
1.0000 g | INTRAVENOUS | Status: DC
  Administered 2012-09-11: 1 g via INTRAVENOUS
  Filled 2012-09-11: qty 10

## 2012-09-11 MED ORDER — ACETAMINOPHEN 650 MG RE SUPP: 650.0000 mg | Freq: Four times a day (QID) | RECTAL | Status: DC | PRN

## 2012-09-11 MED ORDER — METRONIDAZOLE IN NACL 5-0.79 MG/ML-% IV SOLN
500.0000 mg | Freq: Three times a day (TID) | INTRAVENOUS | Status: DC
  Administered 2012-09-11 – 2012-09-17 (×18): 500 mg via INTRAVENOUS
  Filled 2012-09-11 (×20): qty 100

## 2012-09-11 MED ORDER — GABAPENTIN 300 MG PO CAPS
300.0000 mg | ORAL_CAPSULE | Freq: Every day | ORAL | Status: DC
  Administered 2012-09-11 – 2012-09-17 (×7): 300 mg via ORAL
  Filled 2012-09-11 (×7): qty 1

## 2012-09-11 MED ORDER — ACETAMINOPHEN 325 MG PO TABS
650.0000 mg | ORAL_TABLET | Freq: Four times a day (QID) | ORAL | Status: DC | PRN
  Administered 2012-09-15: 650 mg via ORAL
  Filled 2012-09-11: qty 2

## 2012-09-11 MED ORDER — MAGNESIUM OXIDE 400 MG PO TABS
400.0000 mg | ORAL_TABLET | Freq: Every day | ORAL | Status: DC
  Administered 2012-09-11 – 2012-09-17 (×7): 400 mg via ORAL
  Filled 2012-09-11 (×7): qty 1

## 2012-09-11 MED ORDER — SODIUM CHLORIDE 0.9 % IV BOLUS (SEPSIS)
1000.0000 mL | Freq: Once | INTRAVENOUS | Status: AC
  Administered 2012-09-11: 1000 mL via INTRAVENOUS

## 2012-09-11 MED ORDER — ROPINIROLE HCL 1 MG PO TABS
2.0000 mg | ORAL_TABLET | Freq: Two times a day (BID) | ORAL | Status: DC
  Administered 2012-09-11 – 2012-09-17 (×12): 2 mg via ORAL
  Filled 2012-09-11 (×15): qty 2

## 2012-09-11 MED ORDER — ROPINIROLE HCL 1 MG PO TABS
1.0000 mg | ORAL_TABLET | Freq: Two times a day (BID) | ORAL | Status: DC
  Administered 2012-09-12 – 2012-09-17 (×11): 1 mg via ORAL
  Filled 2012-09-11 (×13): qty 1

## 2012-09-11 MED ORDER — GABAPENTIN 300 MG PO CAPS
600.0000 mg | ORAL_CAPSULE | Freq: Every day | ORAL | Status: DC
  Filled 2012-09-11: qty 2

## 2012-09-11 MED ORDER — SODIUM CHLORIDE 0.9 % IV SOLN
INTRAVENOUS | Status: AC
  Administered 2012-09-11: 18:00:00 via INTRAVENOUS

## 2012-09-11 MED ORDER — ROPINIROLE HCL 1 MG PO TABS
1.0000 mg | ORAL_TABLET | Freq: Four times a day (QID) | ORAL | Status: DC
  Filled 2012-09-11 (×2): qty 2

## 2012-09-11 MED ORDER — HYDROCODONE-ACETAMINOPHEN 5-325 MG PO TABS: 1.0000 | ORAL_TABLET | ORAL | Status: DC | PRN

## 2012-09-11 MED ORDER — VITAMIN B-12 1000 MCG PO TABS
1000.0000 ug | ORAL_TABLET | Freq: Every day | ORAL | Status: DC
Start: 2012-09-12 — End: 2012-09-17
  Administered 2012-09-12 – 2012-09-17 (×6): 1000 ug via ORAL
  Filled 2012-09-11 (×6): qty 1

## 2012-09-11 MED ORDER — ONDANSETRON HCL 4 MG PO TABS: 4.0000 mg | ORAL_TABLET | Freq: Four times a day (QID) | ORAL | Status: DC | PRN

## 2012-09-11 MED ORDER — LOPERAMIDE HCL 2 MG PO CAPS: 2.0000 mg | ORAL_CAPSULE | Freq: Four times a day (QID) | ORAL | Status: DC | PRN

## 2012-09-11 MED ORDER — SODIUM CHLORIDE 0.9 % IV SOLN
Freq: Once | INTRAVENOUS | Status: AC
  Administered 2012-09-11: 14:00:00 via INTRAVENOUS

## 2012-09-11 MED ORDER — MORPHINE SULFATE 2 MG/ML IJ SOLN: 1.0000 mg | INTRAMUSCULAR | Status: DC | PRN

## 2012-09-11 MED ORDER — FLUTICASONE PROPIONATE 50 MCG/ACT NA SUSP
2.0000 | Freq: Every day | NASAL | Status: DC
  Administered 2012-09-11 – 2012-09-17 (×7): 2 via NASAL
  Filled 2012-09-11: qty 16

## 2012-09-11 MED ORDER — LEVOTHYROXINE SODIUM 88 MCG PO TABS
88.0000 ug | ORAL_TABLET | Freq: Every day | ORAL | Status: DC
  Administered 2012-09-11 – 2012-09-17 (×7): 88 ug via ORAL
  Filled 2012-09-11 (×7): qty 1

## 2012-09-11 MED ORDER — ONDANSETRON HCL 4 MG/2ML IJ SOLN: 4.0000 mg | Freq: Three times a day (TID) | INTRAMUSCULAR | Status: AC | PRN

## 2012-09-11 MED ORDER — PANTOPRAZOLE SODIUM 40 MG PO TBEC
80.0000 mg | DELAYED_RELEASE_TABLET | Freq: Every day | ORAL | Status: DC
  Administered 2012-09-12: 80 mg via ORAL
  Filled 2012-09-11: qty 2

## 2012-09-11 MED ORDER — SERTRALINE HCL 100 MG PO TABS
100.0000 mg | ORAL_TABLET | Freq: Every day | ORAL | Status: DC
  Administered 2012-09-11 – 2012-09-17 (×7): 100 mg via ORAL
  Filled 2012-09-11 (×7): qty 1

## 2012-09-11 MED ORDER — POTASSIUM CHLORIDE CRYS ER 20 MEQ PO TBCR
40.0000 meq | EXTENDED_RELEASE_TABLET | Freq: Two times a day (BID) | ORAL | Status: AC
  Administered 2012-09-11 – 2012-09-12 (×2): 40 meq via ORAL
  Filled 2012-09-11 (×2): qty 2

## 2012-09-11 NOTE — ED Notes (Signed)
MD at bedside. 

## 2012-09-11 NOTE — ED Notes (Signed)
Pt son, who is HCPOA, states that pt is a DNR. Information communicated to ED MD.

## 2012-09-11 NOTE — Telephone Encounter (Signed)
Informed d/t lab results patient needs to be transported to ED for further A&E regarding infection; will attempt to reach pt's son and/or daughter-in-law. [facility] instructed to send pt by EMS if health worsens before contacting family for transport/SLS

## 2012-09-11 NOTE — ED Notes (Signed)
Bed:WA05<BR> Expected date:<BR> Expected time:<BR> Means of arrival:<BR> Comments:<BR> Hold for triage 3

## 2012-09-11 NOTE — Telephone Encounter (Signed)
Contacted patient's living facility [[facility]] and spoke w/Mia first thing this morning with this information; informed that we would contact son and/or daughter-in-law for transport to ED. Spoke w/Mia later in the morning and gave authority per [facility], if patient's health were to worsen before we could contact Son to send pt via EMS. Son called office back & spoke directly with [facility]; [facility]/Mia informed they would be there shortly to p/u patient and transport to hospital, they will have her belonging ready/SLS

## 2012-09-11 NOTE — ED Notes (Signed)
Sent here by private doctor- Charlynn Court- with UTI and WBC 21, increased confusion. Was recently discharged from hospital last week from same.

## 2012-09-11 NOTE — H&P (Signed)
[facility] History and Physical  Lacee Grey ZOX:[id number] DOB: 03-Oct-1919 DOA: 09/11/2012  Referring physician: ED physician PCP: Letitia Libra, Ala Dach, MD   Chief Complaint: confusion, UTI  HPI:  Pt is 78 yo female who was brought to ED for further evaluation of progressively worsening confusion with failure to thrive. She was seen in PCP office and was told she has UTI, and was given one shot of Rocephin. Please note that most of this history was obtained from family member as pt unable to provide detailed history. Pt has history of UTI and was recently hospitalized for it. She was discharged to rehabilitation facility for 3 weeks before returning to ALF. Per family member, pt is less confused today. No known concerns of fever, chills, abdominal or focal neurological deficits.   Assessment and Plan:  Principal Problem:  *UTI (lower urinary tract infection) - continue rocephin 1 gm Q 24 hours IV - plan to narrow down in 1-2 days based on pt clinical response - follow up urin culture results  Active Problems:  Hypothyroidism - follow up TSH level - continue levothyroxine   Depression with anxiety - may continue sertraline per home regimen   HTN (hypertension) - hypotensive on admission - we will hold all B meds for now - continue IV fluids   Metabolic encephalopathy - likely due to UTI - regimen as above with rocephin and we will see if any improvement in mental status - per family, patient less confused today   CKD (chronic kidney disease) - cratinine already trending down since admission 1.58 - continue IV fluids - follow up BMP in am   Severe protein-calorie malnutrition - nutrition consult ordered   Sacral decubitus ulcer, stage III - does not look infected - wound care consult ordered   Anemia of chronic disease - hemoglobin 10.8 and then 9.9 on admission - no signs of active bleed - will follow up CBC in am   Leukocytosis - secondary to  UTI - follow up CBC in am - continue IV rocephin  Code Status: Full Family Communication: Pt at bedside Disposition Plan: PT evaluation needed  Debbora Presto, MD  [facility] Pager [phone]  If 7PM-7AM, please contact night-coverage [email or web address] Password [email or web address] 09/11/2012, 3:19 PM   Review of Systems:  Unable to obtain given change in mental status    Past Medical History  Diagnosis Date  . Arthritis   . History of chicken pox   . Depression     husband died 10/01/11  . Glaucoma   . Thyroid disease   . Hypertension   . Hypokalemia   . Renal disorder   . Altered mental state   . Blind     Past Surgical History  Procedure Date  . Revision total knee arthroplasty     right knee  . Back surgery   . Cataract extraction, bilateral   . Thyroidectomy   . Abdominal hysterectomy     Social History:  reports that she has never smoked. She has never used smokeless tobacco. She reports that she does not drink alcohol or use illicit drugs.  Allergies  Allergen Reactions  . Codeine Other (See Comments)    insomnia  . Doxycycline Diarrhea and Other (See Comments)    Hallucinations  . Fentanyl Hives and Rash  . Lisinopril Nausea And Vomiting, Rash and Hypertension    Family History  Problem Relation Age of Onset  . Hypertension      Prior to Admission medications   Medication Sig  Start Date End Date Taking? Authorizing Provider  acetaminophen (TYLENOL) 325 MG tablet Take 650 mg by mouth every 6 (six) hours as needed. For pain.   Yes Historical Provider, MD  amLODipine (NORVASC) 10 MG tablet Take 1 tablet (10 mg total) by mouth daily. 08/12/12 08/12/13 Yes Leroy Sea, MD  atenolol (TENORMIN) 50 MG tablet Take 50 mg by mouth daily.   Yes Historical Provider, MD  ciprofloxacin (CIPRO) 250 MG tablet Take 250 mg by mouth 2 (two) times daily. 09/09/12 09/16/12 Yes Edwyna Perfect, MD  fluticasone (FLONASE) 50 MCG/ACT nasal spray Place 2 sprays into the nose daily.   Yes  Historical Provider, MD  furosemide (LASIX) 20 MG tablet Take 20 mg by mouth daily.   Yes Historical Provider, MD  gabapentin (NEURONTIN) 600 MG tablet Take 600 mg by mouth daily.   Yes Historical Provider, MD  HYDROcodone-acetaminophen (VICODIN) 5-500 MG per tablet Take 1 tablet by mouth 2 (two) times daily.   Yes Historical Provider, MD  levothyroxine (SYNTHROID, LEVOTHROID) 88 MCG tablet Take 88 mcg by mouth daily.    Yes Historical Provider, MD  lidocaine (LIDODERM) 5 % Place 0.25 patches onto the skin daily. Remove & Discard patch within 12 hours or as directed by MD; applied to neck.   Yes Historical Provider, MD  loperamide (IMODIUM) 2 MG capsule Take 2 mg by mouth 4 (four) times daily as needed. For diarrhea.   Yes Historical Provider, MD  magnesium oxide (MAG-OX 400) 400 MG tablet Take 1 tablet (400 mg total) by mouth daily. 06/17/12 06/17/13 Yes Edwyna Perfect, MD  Multiple Vitamins-Minerals (PRESERVISION AREDS 2 PO) Take 1 tablet by mouth daily.   Yes Historical Provider, MD  omeprazole (PRILOSEC) 40 MG capsule Take 1 capsule (40 mg total) by mouth daily. 05/02/12 05/02/13 Yes Edwyna Perfect, MD  rOPINIRole (REQUIP) 1 MG tablet Take 1-2 mg by mouth 4 (four) times daily. 1mg  at 2AM; 2mg  at 8AM; 1mg  at [facility]; 2mg  at 8PM   Yes Historical Provider, MD  sertraline (ZOLOFT) 100 MG tablet Take 1 tablet (100 mg total) by mouth daily. 06/17/12 06/17/13 Yes Edwyna Perfect, MD  vitamin B-12 (CYANOCOBALAMIN) 1000 MCG tablet Take 1,000 mcg by mouth daily.   Yes Historical Provider, MD    Physical Exam: Filed Vitals:   09/11/12 1230  BP: 91/40  Pulse: 58  Temp: 98.6 F (37 C)  TempSrc: Oral  Resp: 16  SpO2: 99%    Physical Exam  Constitutional: Appears well-developed and well-nourished. No distress.  HENT: Normocephalic. External right and left ear normal. Oropharynx is clear and moist.  Eyes: Conjunctivae and EOM are normal. PERRLA, no scleral icterus.  Neck: Normal ROM. Neck supple. No JVD. No  tracheal deviation. No thyromegaly.  CVS: RRR, S1/S2 +, no murmurs, no gallops, no carotid bruit.  Pulmonary: Effort and breath sounds normal, no stridor, rhonchi, wheezes, rales.  Abdominal: Soft. BS +,  no distension, tenderness, rebound or guarding.  Musculoskeletal: Normal range of motion. No edema and no tenderness.  Lymphadenopathy: No lymphadenopathy noted, cervical, inguinal. Neuro: Alert. Normal reflexes, muscle tone coordination. No cranial nerve deficit. Skin: Skin is warm and dry. Stage III sacral decubitus ulcer   Labs on Admission:  Basic Metabolic Panel:  Lab 09/11/12 0865 09/09/12 1434  NA 132* 134*  K 3.7 3.9  CL 99 102  CO2 19 18*  GLUCOSE 104* 92  BUN 39* 43*  CREATININE 1.58* 1.74*  CALCIUM 8.0* 8.1*   Liver  Function Tests:  Lab 09/09/12 1434  AST 23  ALT 23  ALKPHOS 460*  BILITOT 0.9  PROT 6.5  ALBUMIN 2.8*   CBC:  Lab 09/11/12 1348 09/09/12 1434  WBC 31.1* 21.5*  NEUTROABS 29.0* 19.0*  HGB 9.9* 10.8*  HCT 28.6* 30.0*  MCV 90.5 93.2  PLT 332 442*    Radiological Exams on Admission: Dg Chest Portable 1 View 09/11/2012  *  IMPRESSION: Low lung volumes with atelectasis.      EKG: Normal sinus rhythm, no ST/T wave changes  Time spent: 75 minutes

## 2012-09-11 NOTE — ED Provider Notes (Signed)
History     CSN: [id number]  Arrival date & time 09/11/12  1154   First MD Initiated Contact with Patient 09/11/12 1245      No chief complaint on file.   (Consider location/radiation/quality/duration/timing/severity/associated sxs/prior treatment) The history is provided by the patient and a relative.   78 year old female was brought in because of urinary tract infection and leukocytosis. She had recently been hospitalized with a urinary tract infection and confusion and had gone to a rehabilitation facility for 3 weeks before returning to assisted living. Family noted that she had been confused yesterday. She had a urinalysis done 2 days ago which showed evidence of urinary tract infection, and WBC done yesterday at her doctor's office was 21,000. She had received an injection of Rocephin in the doctor's office, and family states that she is less confused today. However, her doctor was worried about the leukocytosis, and also there have been some decrease in renal function.  Past Medical History  Diagnosis Date  . Arthritis   . History of chicken pox   . Depression     husband died 09/24/11  . Glaucoma   . Thyroid disease   . Hypertension   . Hypokalemia   . Renal disorder   . Altered mental state   . Blind     Past Surgical History  Procedure Date  . Revision total knee arthroplasty     right knee  . Back surgery   . Cataract extraction, bilateral   . Thyroidectomy   . Abdominal hysterectomy     Family History  Problem Relation Age of Onset  . Hypertension      History  Substance Use Topics  . Smoking status: Never Smoker   . Smokeless tobacco: Never Used  . Alcohol Use: No    OB History    Grav Para Term Preterm Abortions TAB SAB Ect Mult Living                  Review of Systems  All other systems reviewed and are negative.    Allergies  Codeine; Doxycycline; Fentanyl; and Lisinopril  Home Medications   Current Outpatient Rx  Name Route Sig  Dispense Refill  . ACETAMINOPHEN 325 MG PO TABS Oral Take 650 mg by mouth every 6 (six) hours as needed. For pain.    Marland Kitchen AMLODIPINE BESYLATE 10 MG PO TABS Oral Take 1 tablet (10 mg total) by mouth daily.    . ATENOLOL 25 MG PO TABS Oral Take 1 tablet (25 mg total) by mouth daily.    Marland Kitchen BISACODYL 5 MG PO TBEC Oral Take 5 mg by mouth daily.     Marland Kitchen CIPROFLOXACIN HCL 250 MG PO TABS Oral Take 1 tablet (250 mg total) by mouth 2 (two) times daily. 14 tablet 0    Rx phone in & faxed to [facility] at [phone] ...  . ENSURE PO LIQD Oral Take 237 mLs by mouth 3 (three) times daily between meals. Ensure butter/pecan liquid    . FLUTICASONE PROPIONATE 50 MCG/ACT NA SUSP Nasal Place 2 sprays into the nose daily.    Marland Kitchen GABAPENTIN 300 MG PO CAPS Oral Take 1 capsule (300 mg total) by mouth at bedtime.    Marland Kitchen HYDRALAZINE HCL 10 MG PO TABS Oral Take 1 tablet (10 mg total) by mouth every 8 (eight) hours.    Marland Kitchen LEVOTHYROXINE SODIUM 88 MCG PO TABS Oral Take 88 mcg by mouth daily.     Marland Kitchen LIDOCAINE 5 %  EX PTCH Transdermal Place 0.25 patches onto the skin daily. Remove & Discard patch within 12 hours or as directed by MD; applied to neck.    Marland Kitchen MAGNESIUM OXIDE 400 MG PO TABS Oral Take 1 tablet (400 mg total) by mouth daily. 30 tablet 11  . PRESERVISION AREDS 2 PO Oral Take 1 tablet by mouth daily.    Marland Kitchen OMEPRAZOLE 40 MG PO CPDR Oral Take 1 capsule (40 mg total) by mouth daily. 30 capsule 3  . ROPINIROLE HCL 1 MG PO TABS Oral Take 1-2 mg by mouth 4 (four) times daily. 1mg  at 2AM; 2mg  at 8AM; 1mg  at [facility]; 2mg  at 8PM    . SERTRALINE HCL 100 MG PO TABS Oral Take 1 tablet (100 mg total) by mouth daily. 30 tablet 6  . VITAMIN B-12 1000 MCG PO TABS Oral Take 1,000 mcg by mouth daily.      BP 91/40  Pulse 58  Temp 98.6 F (37 C) (Oral)  Resp 16  SpO2 99%  Physical Exam  Nursing note and vitals reviewed. 78 year old female, resting comfortably and in no acute distress. Vital signs are significant for borderline bradycardia  with heart rate 58. Oxygen saturation is 99%, which is normal. Head is normocephalic and atraumatic. PERRLA, EOMI. Oropharynx is clear. Neck is nontender and supple without adenopathy or JVD. Back is nontender and there is no CVA tenderness. Lungs are clear without rales, wheezes, or rhonchi. Chest is nontender. Heart has regular rate and rhythm with 2/6 holosystolic murmur along the left sternal border. Abdomen is soft, flat, nontender without masses or hepatosplenomegaly and peristalsis is normoactive. Extremities have no cyanosis or edema, full range of motion is present. Skin is warm and dry without rash. Stage III sacral decubitus is present without significant drainage and no erythema of the skin margins. Neurologic: Mental status is normal, cranial nerves are intact, there are no motor or sensory deficits.   ED Course  Procedures (including critical care time)  Results for orders placed during the hospital encounter of 09/11/12  CBC WITH DIFFERENTIAL      Component Value Range   WBC 31.1 (*) 4.0 - 10.5 K/uL   RBC 3.16 (*) 3.87 - 5.11 MIL/uL   Hemoglobin 9.9 (*) 12.0 - 15.0 g/dL   HCT 16.1 (*) 09.6 - 04.5 %   MCV 90.5  78.0 - 100.0 fL   MCH 31.3  26.0 - 34.0 pg   MCHC 34.6  30.0 - 36.0 g/dL   RDW 40.9  81.1 - 91.4 %   Platelets 332  150 - 400 K/uL   Neutrophils Relative 93 (*) 43 - 77 %   Lymphocytes Relative 2 (*) 12 - 46 %   Monocytes Relative 4  3 - 12 %   Eosinophils Relative 1  0 - 5 %   Basophils Relative 0  0 - 1 %   Neutro Abs 29.0 (*) 1.7 - 7.7 K/uL   Lymphs Abs 0.6 (*) 0.7 - 4.0 K/uL   Monocytes Absolute 1.2 (*) 0.1 - 1.0 K/uL   Eosinophils Absolute 0.3  0.0 - 0.7 K/uL   Basophils Absolute 0.0  0.0 - 0.1 K/uL   WBC Morphology MILD LEFT SHIFT (1-5% METAS, OCC MYELO, OCC BANDS)    BASIC METABOLIC PANEL      Component Value Range   Sodium 132 (*) 135 - 145 mEq/L   Potassium 3.7  3.5 - 5.1 mEq/L   Chloride 99  96 - 112 mEq/L  CO2 19  19 - 32 mEq/L   Glucose,  Bld 104 (*) 70 - 99 mg/dL   BUN 39 (*) 6 - 23 mg/dL   Creatinine, Ser 2.72 (*) 0.50 - 1.10 mg/dL   Calcium 8.0 (*) 8.4 - 10.5 mg/dL   GFR calc non Af Amer 28 (*) >90 mL/min   GFR calc Af Amer 32 (*) >90 mL/min  URINALYSIS, ROUTINE W REFLEX MICROSCOPIC      Component Value Range   Color, Urine YELLOW  YELLOW   APPearance CLEAR  CLEAR   Specific Gravity, Urine 1.013  1.005 - 1.030   pH 5.5  5.0 - 8.0   Glucose, UA NEGATIVE  NEGATIVE mg/dL   Hgb urine dipstick NEGATIVE  NEGATIVE   Bilirubin Urine NEGATIVE  NEGATIVE   Ketones, ur NEGATIVE  NEGATIVE mg/dL   Protein, ur NEGATIVE  NEGATIVE mg/dL   Urobilinogen, UA 0.2  0.0 - 1.0 mg/dL   Nitrite NEGATIVE  NEGATIVE   Leukocytes, UA SMALL (*) NEGATIVE  URINE MICROSCOPIC-ADD ON      Component Value Range   Squamous Epithelial / LPF RARE  RARE   WBC, UA 3-6  <3 WBC/hpf   Bacteria, UA FEW (*) RARE   Dg Chest Portable 1 View  09/11/2012  *RADIOLOGY REPORT*  Clinical Data: 78 year old female altered mental status. Leukocytosis.  Urinary tract infection.  PORTABLE CHEST - 1 VIEW  Comparison: 03/17/2012.  Findings: Portable semi upright AP view 1303 hours.  Kyphotic view and low lung volumes.  Mild bibasilar opacity most resembles atelectasis.  No pneumothorax or edema.  No pleural effusion or definite consolidation. Stable cardiomegaly and mediastinal contours.  IMPRESSION: Low lung volumes with atelectasis.   Original Report Authenticated By: Ulla Potash III, M.D.       1. Urinary tract infection   2. Leukocytosis   3. Renal insufficiency   4. Anemia of chronic disease   5. CKD (chronic kidney disease)   6. Depression with anxiety   7. Sacral decubitus ulcer, stage III       MDM  Urinary tract infection. Her prior records are reviewed and laboratory workup which will have been done yesterday did show WBC of 21,500, and elevation of creatinine over baseline - creatinine today is 1.74 and on September 4 it was 1.14.  Urinalysis apparently  has improved as her only 3-6 WBC and few bacteria. Renal function has improved somewhat with creatinine coming down to 1.58. This is still above her baseline. WBC is actually increased to 31.1. She was given another dose of ceftriaxone today. Because of rising WBC and persistent renal insufficiency, it is felt that she needs to be admitted for IV hydration and ongoing IV antibiotics. Cultures should be ready tomorrow from the assisted-living facility, and antibiotic selection can be changed at that point. Case is discussed with Dr. Lenise Arena of [facility] who agrees to admit the patient.       Dione Booze, MD 09/11/12 1515

## 2012-09-11 NOTE — Telephone Encounter (Signed)
Contacted pt's son and daughter in law with concerns. Seen with possible UTI 10/1. UA + and cx pending from facility. Given rocephin one gm IM and begun on cipro. Reviewed CBC with leukocytosis 20k with bandemia and chem7 demonstrating ARF. Given concern over potential urosepsis and advanced age recommend transfer to local ED for further evaluation and potential admission. They state understanding and agreement.

## 2012-09-12 DIAGNOSIS — A0472 Enterocolitis due to Clostridium difficile, not specified as recurrent: Secondary | ICD-10-CM

## 2012-09-12 HISTORY — DX: Enterocolitis due to Clostridium difficile, not specified as recurrent: A04.72

## 2012-09-12 LAB — COMPREHENSIVE METABOLIC PANEL
Albumin: 1.6 g/dL — ABNORMAL LOW (ref 3.5–5.2)
BUN: 30 mg/dL — ABNORMAL HIGH (ref 6–23)
CO2: 19 mEq/L (ref 19–32)
Chloride: 104 mEq/L (ref 96–112)
Creatinine, Ser: 1.33 mg/dL — ABNORMAL HIGH (ref 0.50–1.10)
GFR calc Af Amer: 39 mL/min — ABNORMAL LOW (ref >90)
GFR calc non Af Amer: 34 mL/min — ABNORMAL LOW (ref >90)
Glucose, Bld: 94 mg/dL (ref 70–99)
Total Bilirubin: 0.5 mg/dL (ref 0.3–1.2)

## 2012-09-12 LAB — GLUCOSE, CAPILLARY: Glucose-Capillary: 91 mg/dL (ref 70–99)

## 2012-09-12 LAB — CBC
Platelets: 294 10*3/uL (ref 150–400)
RBC: 2.64 MIL/uL — ABNORMAL LOW (ref 3.87–5.11)
RDW: 14.6 % (ref 11.5–15.5)
WBC: 30.4 10*3/uL — ABNORMAL HIGH (ref 4.0–10.5)

## 2012-09-12 LAB — IRON AND TIBC
Iron: <10 ug/dL — ABNORMAL LOW (ref 42–135)
UIBC: 110 ug/dL — ABNORMAL LOW (ref 125–400)

## 2012-09-12 LAB — TSH: TSH: 0.848 u[IU]/mL (ref 0.350–4.500)

## 2012-09-12 LAB — FOLATE: Folate: 13.7 ng/mL

## 2012-09-12 MED ORDER — COLLAGENASE 250 UNIT/GM EX OINT
TOPICAL_OINTMENT | Freq: Every day | CUTANEOUS | Status: DC
  Administered 2012-09-12 – 2012-09-17 (×5): via TOPICAL
  Filled 2012-09-12: qty 30

## 2012-09-12 MED ORDER — ENSURE COMPLETE PO LIQD
237.0000 mL | Freq: Two times a day (BID) | ORAL | Status: DC
  Administered 2012-09-12: 150 mL via ORAL
  Administered 2012-09-13 – 2012-09-17 (×6): 237 mL via ORAL

## 2012-09-12 MED ORDER — VANCOMYCIN 50 MG/ML ORAL SOLUTION
125.0000 mg | Freq: Four times a day (QID) | ORAL | Status: DC
  Administered 2012-09-12 – 2012-09-17 (×22): 125 mg via ORAL
  Filled 2012-09-12 (×28): qty 2.5

## 2012-09-12 MED ORDER — SODIUM CHLORIDE 0.9 % IV SOLN
INTRAVENOUS | Status: DC
  Administered 2012-09-12 – 2012-09-14 (×3): via INTRAVENOUS
  Administered 2012-09-14 – 2012-09-15 (×2): 1000 mL via INTRAVENOUS
  Administered 2012-09-16: 08:00:00 via INTRAVENOUS
  Administered 2012-09-17: 1000 mL via INTRAVENOUS

## 2012-09-12 NOTE — Consult Note (Signed)
WOC consult Note Reason for Consult: eval sacral wound.  Wound type: Stage III pressure ulcer POA Pressure Ulcer POA: Yes Measurement: 3.5cm x 3.0cm x 1.0cm  Wound bed:50% yellow-grey slough , 50% pink and moist Drainage (amount, consistency, odor) serous, minimal, no odor Periwound: intact but reddened secondary to stool, pt with cdiff, NT applied barrier cream to buttocks after cleansing of small stool Dressing procedure/placement/frequency: enzymatic debridement ointment impregnated into 2x2 gauze, cover with foam dressing and change only packing daily. OK to use foam dressing up to 3 days before changing.  On air mattress for pressure redistribution and chair pressure redistribution pad in room.  Discussed POC with bedside nurse she will apply Santyl when it comes from pharmacy Re consult if needed, will not follow at this time. Thanks  Chamika Cunanan [employer], CWOCN [phone])

## 2012-09-12 NOTE — Progress Notes (Signed)
INITIAL ADULT NUTRITION ASSESSMENT Date: 09/12/2012   Time: 12:41 PM Reason for Assessment: Consult  INTERVENTION: Ensure Complete BID. Nursing to continue to assist pt at mealtimes. Will monitor.   Pt meets criteria for severe malnutrition of chronic illness AEB <75% estimated energy intake for the past month with 7.1% weight loss in the past month per pt report in addition to pt with skin breakdown with stage III sacral pressure ulcer per nursing and MD notes.   ASSESSMENT: Female 78 y.o.  Dx: UTI (lower urinary tract infection)  Food/Nutrition Related Hx: Pt from assisted living facility. Pt reports she has never been a big eater but her intake has declined recently and reports 10 pound unintended weight loss in the past month. Pt reports eating 3 meals/day however does not consume 100% at every meal. Pt was on Ensure TID PTA, however states that for the past few days she was not getting it at the facility. Pt denies any problems chewing or swallowing. Pt with stage III sacral pressure ulcer per MD. Pt is legally blind and requires assistance at mealtimes - noted nursing helping pt with lunch. Pt found to have C. Difficile.   Hx:  Past Medical History  Diagnosis Date  . Arthritis   . History of chicken pox   . Depression     husband died 09/25/2011  . Glaucoma   . Thyroid disease   . Hypertension   . Hypokalemia   . Renal disorder   . Altered mental state   . Blind   . Macular degeneration    Related Meds:  Scheduled Meds:   . sodium chloride   Intravenous Once  . sodium chloride   Intravenous STAT  . cefTRIAXone (ROCEPHIN) IVPB 1 gram/50 mL D5W  1 g Intravenous Q24H  . fluticasone  2 spray Each Nare Daily  . gabapentin  300 mg Oral Daily  . levothyroxine  88 mcg Oral Daily  . magnesium oxide  400 mg Oral Daily  . metronidazole  500 mg Intravenous Q8H  . potassium chloride  40 mEq Oral BID  . rOPINIRole  1 mg Oral Q12H  . rOPINIRole  2 mg Oral Q12H  . sertraline  100 mg  Oral Daily  . sodium chloride  1,000 mL Intravenous Once  . vancomycin  125 mg Oral QID  . vitamin B-12  1,000 mcg Oral Daily  . DISCONTD: cefTRIAXone (ROCEPHIN) IVPB 1 gram/50 mL D5W  1 g Intravenous Q24H  . DISCONTD: gabapentin  600 mg Oral Daily  . DISCONTD: pantoprazole  80 mg Oral Daily  . DISCONTD: rOPINIRole  1-2 mg Oral QID   Continuous Infusions:   . sodium chloride     PRN Meds:.acetaminophen, acetaminophen, HYDROcodone-acetaminophen, loperamide, morphine injection, ondansetron (ZOFRAN) IV, ondansetron (ZOFRAN) IV, ondansetron  Ht: 5\' 1"  (154.9 cm)  Wt: 129 lb 13.6 oz (58.9 kg)  Ideal Wt: 105 lb % Ideal Wt: 122  Usual Wt: 139 lb % Usual Wt: 92  Body mass index is 24.54 kg/(m^2).   Labs:  CMP     Component Value Date/Time   NA 133* 09/12/2012 0315   K 3.1* 09/12/2012 0315   CL 104 09/12/2012 0315   CO2 19 09/12/2012 0315   GLUCOSE 94 09/12/2012 0315   BUN 30* 09/12/2012 0315   CREATININE 1.33* 09/12/2012 0315   CREATININE 1.74* 09/09/2012 1434   CALCIUM 7.2* 09/12/2012 0315   PROT 5.1* 09/12/2012 0315   ALBUMIN 1.6* 09/12/2012 0315   AST 12 09/12/2012 0315  ALT 14 09/12/2012 0315   ALKPHOS 317* 09/12/2012 0315   BILITOT 0.5 09/12/2012 0315   GFRNONAA 34* 09/12/2012 0315   GFRAA 39* 09/12/2012 0315    Intake/Output Summary (Last 24 hours) at 09/12/12 1246 Last data filed at 09/12/12 1100  Gross per 24 hour  Intake 2034.17 ml  Output   1053 ml  Net 981.17 ml   Last BM - 10/4  Diet Order: General   IVF:    sodium chloride    Estimated Nutritional Needs:   Kcal:1750-2050 Protein:75-90g Fluid:1.7-2L  NUTRITION DIAGNOSIS: -Increased nutrient needs (NI-5.1).  Status: Ongoing  RELATED TO: stage III sacral pressure ulcer  AS EVIDENCE BY: MD notes  MONITORING/EVALUATION(Goals): Pt to consume >90% of meals/supplements.   EDUCATION NEEDS: -No education needs identified at this time   Dietitian #: [phone]  DOCUMENTATION CODES Per approved criteria   -Severe malnutrition in the context of chronic illness    Marshall Cork 09/12/2012, 12:41 PM

## 2012-09-12 NOTE — Progress Notes (Signed)
CARE MANAGEMENT NOTE 09/12/2012  Patient:  [name] [name],[name] [name]   Account Number:  [id number]  Date Initiated:  09/12/2012  Documentation initiated by:  PEARSON,COOKIE  Subjective/Objective Assessment:   pt admitted with cco confusion and UTI.  Labs: C-Diff positive     Action/Plan:   plan to dc back to ALF   Anticipated DC Date:  09/15/2012   Anticipated DC Plan:  ASSISTED LIVING / REST HOME  In-house referral  Clinical Social Worker      DC Planning Services  CM consult      Choice offered to / List presented to:             Status of service:  In process, will continue to follow Medicare Important Message given?  NO (If response is "NO", the following Medicare IM given date fields will be blank) Date Medicare IM given:   Date Additional Medicare IM given:    Discharge Disposition:    Per UR Regulation:  Reviewed for med. necessity/level of care/duration of stay  If discussed at Long Length of Stay Meetings, dates discussed:    Comments:  09/12/12 MPearson, RN, BSN Pt from ALF, plan to dc back when stable.

## 2012-09-12 NOTE — Progress Notes (Signed)
Clinical Social Work Department BRIEF PSYCHOSOCIAL ASSESSMENT 09/12/2012  Patient:  [name] [name], [name] [name]     Account Number:  [id number]     Admit date:  09/11/2012  Clinical Social Worker:  Skip Mayer  Date/Time:  09/12/2012 02:30 PM  Referred by:  Physician  Date Referred:  09/12/2012 Referred for  Other - See comment   Other Referral:   From facility   Interview type:  Other - See comment Other interview type:   [facility] admissions    PSYCHOSOCIAL DATA Living Status:  FACILITY Admitted from facility:  [facility] Level of care:  Assisted Living Primary support name:  Franky Macho Primary support relationship to patient:  CHILD, ADULT Degree of support available:   Adequate    CURRENT CONCERNS Current Concerns  Post-Acute Placement   Other Concerns:    SOCIAL WORK ASSESSMENT / PLAN Pt admitted from [facility].  Pt with AMS.  CSW left message for pt's son requesting return call.  CSW spoke with Aniceto Boss at [facility] who reports pt able to return to ALF if no IV meds needed.  Pt is able to return over weekend if appropriate.  PT eval pending.  CSW will continue to follow and assist with d/c needs.   Assessment/plan status:  Information/Referral to [facility] Other assessment/ plan:   Information/referral to community resources:   ALF  PTAR    PATIENT'S/FAMILY'S RESPONSE TO PLAN OF CARE: Pt unable to participate in interview due to AMS.  Message left for pt's son requesting return call.  Per [place] at [facility], pt is able to return provided no IV meds required.  CSW will continue to follow.        Dellie Burns, MSW, [place] [phone] (coverage)

## 2012-09-12 NOTE — Evaluation (Signed)
Physical Therapy Evaluation Patient Details Name: [name] [name] MRN: [id number] DOB: December 01, 1919 Today's Date: 09/12/2012 Time: 4132-4401 PT Time Calculation (min): 20 min  PT Assessment / Plan / Recommendation Clinical Impression  78 yo female admitted with UTI and +cdiff who has recently been discharged to ALF from SNF.  Despite kyphosis and generalized deconditioning pt was able to assist with bed mobility and walk with RW.  If ALF can provide additional assist initially, anticipate pt wil be able to return there.  If not, she might need SNF for rehab and wound care.    PT Assessment  Patient needs continued PT services    Follow Up Recommendations  Post acute inpatient rehab;Home health PT (depends on level of assist ALF can provide) No, Recommend SNF   Barriers to Discharge        Equipment Recommendations  None recommended by PT    Recommendations for Other Services     Frequency Min 3X/week    Precautions / Restrictions Precautions Precautions: Fall   Pertinent Vitals/Pain Pt c/o some pain from the wound on her back      Mobility  Bed Mobility Bed Mobility: Supine to Sit;Sit to Supine Supine to Sit: 3: Mod assist Details for Bed Mobility Assistance: pt with some difficulty due to air mattress Transfers Transfers: Sit to Stand;Stand to Sit Sit to Stand: 3: Mod assist Stand to Sit: 3: Mod assist Details for Transfer Assistance: assist to bring weight forward onto feet Ambulation/Gait Ambulation/Gait Assistance: 4: Min assist Ambulation Distance (Feet): 15 Feet Assistive device: Rolling walker Ambulation/Gait Assistance Details: pt with fixed kyphosis and forward head.  She needs assist to advance RW Gait Pattern: Step-to pattern;Decreased step length - right;Decreased step length - left;Trunk flexed Gait velocity: decreased General Gait Details: pt needs support for standing and advancing RW,but she is able to weight shift and advance legs Stairs:  No Wheelchair Mobility Wheelchair Mobility: No    Shoulder Instructions     Exercises     PT Diagnosis: Difficulty walking;Abnormality of gait;Generalized weakness;Acute pain  PT Problem List: Decreased range of motion;Decreased strength;Decreased activity tolerance;Decreased mobility PT Treatment Interventions: Gait training;DME instruction;Functional mobility training;Therapeutic activities;Therapeutic exercise   PT Goals Acute Rehab PT Goals PT Goal Formulation: With patient Time For Goal Achievement: 09/26/12 Potential to Achieve Goals: Good Pt will go Supine/Side to Sit: with modified independence PT Goal: Supine/Side to Sit - Progress: Goal set today Pt will go Sit to Supine/Side: with modified independence PT Goal: Sit to Supine/Side - Progress: Goal set today Pt will go Sit to Stand: with modified independence PT Goal: Sit to Stand - Progress: Goal set today Pt will go Stand to Sit: with modified independence PT Goal: Stand to Sit - Progress: Goal set today Pt will Ambulate: 51 - 150 feet;with modified independence;with least restrictive assistive device PT Goal: Ambulate - Progress: Goal set today  Visit Information  Last PT Received On: 09/12/12 Assistance Needed: +2 (safety)    Subjective Data  Subjective: "I can try" Patient Stated Goal: pt agreeable to walk   Prior Functioning  Home Living Type of Home: Assisted living Prior Function Comments: unsure    Cognition  Overall Cognitive Status: Appears within functional limits for tasks assessed/performed Arousal/Alertness: Awake/alert Orientation Level: Appears intact for tasks assessed Behavior During Session: [facility] for tasks performed    Extremity/Trunk Assessment Right Lower Extremity Assessment RLE ROM/Strength/Tone: Deficits RLE ROM/Strength/Tone Deficits: pt with generalized muscle atrophy Left Lower Extremity Assessment LLE ROM/Strength/Tone: Deficits LLE ROM/Strength/Tone Deficits: pt  with  generalized muscle atrophy Trunk Assessment Trunk Assessment: Kyphotic;Other exceptions Trunk Exceptions: pt with kyphosis and forward head with lat side bending to right   Balance Balance Balance Assessed: Yes Static Sitting Balance Static Sitting - Balance Support: Bilateral upper extremity supported Static Sitting - Level of Assistance: 5: Stand by assistance Static Sitting - Comment/# of Minutes: 3 pt with flexed posture , but able to maintain balance Static Standing Balance Static Standing - Balance Support: Bilateral upper extremity supported;During functional activity (on RW) Static Standing - Level of Assistance: 5: Stand by assistance Static Standing - Comment/# of Minutes: 3  End of Session PT - End of Session Activity Tolerance: Patient tolerated treatment well Patient left: in bed;with nursing in room Nurse Communication: Mobility status  GP     Rosey Bath K. [place], [place] [phone] 09/12/2012, 3:52 PM

## 2012-09-12 NOTE — Progress Notes (Signed)
[facility] PROGRESS NOTE  [name] [name] ZHY:[id number] DOB: October 05, 1919 DOA: 09/11/2012 PCP: Estill Cotta, MD  Assessment/Plan: 1.  *UTI (lower urinary tract infection)  - continue rocephin 1 gm Q 24 hours IV  - plan to narrow down in 1-2 days based on pt clinical response  - urine cultures negative so far. Active Problems:  Hypothyroidism  -  TSH level normal - continue levothyroxine  Depression with anxiety  - may continue sertraline per home regimen  HTN (hypertension)  - hypotensive on admission  - we will hold all B meds for now  - continue IV fluids  Metabolic encephalopathy  - likely due to UTI  - regimen as above with rocephin. - she appears to be at baseline.   CKD (chronic kidney disease)  - cratinine already trending down since admission 1.58 to 1.33 - continue IV fluids  - follow up BMP in am  Severe protein-calorie malnutrition  - nutrition consult ordered  Sacral decubitus ulcer, stage III  - does not look infected  - wound care consult ordered  Anemia of chronic disease  - hemoglobin 10.8 and then 9.9  To 8.3, will get anemia panel and stool for occult blood to see if she has microscopic bleeding.  - no signs of active bleed  - will follow up CBC in am  Leukocytosis  - secondary to UTI and c diff  - follow up CBC in am  - continue IV rocephin for UTI and started her on oral vancomycin.   Diarrhea: - c diff pcr positive - started her on oral vancomycin.   Code Status: DNR Family Communication: none at bedside Disposition Plan: PENDING PT/OT EVAL.   Consultants:  Wound care consult.   Procedures:  none  Antibiotics:  Rocephin -10/3>  HPI/Subjective: No new complaints.   Objective: Filed Vitals:   09/11/12 1230 09/11/12 1645 09/11/12 2155 09/12/12 0605  BP: 91/40  100/32 95/33  Pulse: 58  60 60  Temp: 98.6 F (37 C)  100.5 F (38.1 C) 98.5 F (36.9 C)  TempSrc: Oral  Oral Oral  Resp: 16  16 16   Height:  5'  1" (1.549 m)    Weight:  49.442 kg (109 lb)  58.9 kg (129 lb 13.6 oz)  SpO2: 99%  90% 91%    Intake/Output Summary (Last 24 hours) at 09/12/12 1125 Last data filed at 09/12/12 1100  Gross per 24 hour  Intake 1794.17 ml  Output   1053 ml  Net 741.17 ml   Filed Weights   09/11/12 1645 09/12/12 0605  Weight: 49.442 kg (109 lb) 58.9 kg (129 lb 13.6 oz)    Exam: Constitutional: alert comfortable.  CVS: RRR, S1/S2 +, no murmurs, no gallops, no carotid bruit.  Pulmonary: Effort and breath sounds normal, no stridor, rhonchi, wheezes, rales.  Abdominal: Soft. BS +, no distension, tenderness, rebound or guarding.  Musculoskeletal: Normal range of motion. No edema and no tenderness Skin: Skin is warm and dry. Stage III sacral decubitus ulcer    Data Reviewed: Basic Metabolic Panel:  Lab 09/12/12 2952 09/11/12 1717 09/11/12 1348 09/09/12 1434  NA 133* 128* 132* 134*  K 3.1* 3.2* 3.7 3.9  CL 104 97 99 102  CO2 19 20 19  18*  GLUCOSE 94 93 104* 92  BUN 30* 34* 39* 43*  CREATININE 1.33* 1.42* 1.58* 1.74*  CALCIUM 7.2* 7.7* 8.0* 8.1*  MG -- 2.2 -- --  PHOS -- 2.7 -- --   Liver Function Tests:  Lab 09/12/12 0315 09/11/12 1717 09/09/12 1434  AST 12 17 23   ALT 14 16 23   ALKPHOS 317* 386* 460*  BILITOT 0.5 0.5 0.9  PROT 5.1* 5.8* 6.5  ALBUMIN 1.6* 1.9* 2.8*   No results found for this basename: LIPASE:5,AMYLASE:5 in the last 168 hours No results found for this basename: AMMONIA:5 in the last 168 hours CBC:  Lab 09/12/12 0315 09/11/12 1348 09/09/12 1434  WBC 30.4* 31.1* 21.5*  NEUTROABS -- 29.0* 19.0*  HGB 8.3* 9.9* 10.8*  HCT 23.5* 28.6* 30.0*  MCV 89.0 90.5 93.2  PLT 294 332 442*   Cardiac Enzymes: No results found for this basename: CKTOTAL:5,CKMB:5,CKMBINDEX:5,TROPONINI:5 in the last 168 hours BNP (last 3 results) No results found for this basename: PROBNP:3 in the last 8760 hours CBG:  Lab 09/12/12 0742  GLUCAP 91    Recent Results (from the past 240 hour(s))    CLOSTRIDIUM DIFFICILE BY PCR     Status: Abnormal   Collection Time   09/12/12  3:41 AM      Component Value Range Status Comment   C difficile by pcr POSITIVE (*) NEGATIVE Final      Studies: Dg Chest Portable 1 View  09/11/2012  *RADIOLOGY REPORT*  Clinical Data: 78 year old female altered mental status. Leukocytosis.  Urinary tract infection.  PORTABLE CHEST - 1 VIEW  Comparison: 03/17/2012.  Findings: Portable semi upright AP view 1303 hours.  Kyphotic view and low lung volumes.  Mild bibasilar opacity most resembles atelectasis.  No pneumothorax or edema.  No pleural effusion or definite consolidation. Stable cardiomegaly and mediastinal contours.  IMPRESSION: Low lung volumes with atelectasis.   Original Report Authenticated By: Ulla Potash III, M.D.     Scheduled Meds:   . sodium chloride   Intravenous Once  . sodium chloride   Intravenous STAT  . cefTRIAXone (ROCEPHIN) IVPB 1 gram/50 mL D5W  1 g Intravenous Q24H  . fluticasone  2 spray Each Nare Daily  . gabapentin  300 mg Oral Daily  . levothyroxine  88 mcg Oral Daily  . magnesium oxide  400 mg Oral Daily  . metronidazole  500 mg Intravenous Q8H  . pantoprazole  80 mg Oral Daily  . potassium chloride  40 mEq Oral BID  . rOPINIRole  1 mg Oral Q12H  . rOPINIRole  2 mg Oral Q12H  . sertraline  100 mg Oral Daily  . sodium chloride  1,000 mL Intravenous Once  . vancomycin  125 mg Oral QID  . vitamin B-12  1,000 mcg Oral Daily  . DISCONTD: cefTRIAXone (ROCEPHIN) IVPB 1 gram/50 mL D5W  1 g Intravenous Q24H  . DISCONTD: gabapentin  600 mg Oral Daily  . DISCONTD: rOPINIRole  1-2 mg Oral QID   Continuous Infusions:   Principal Problem:  *UTI (lower urinary tract infection) Active Problems:  Hypothyroidism  Depression with anxiety  HTN (hypertension)  Metabolic encephalopathy  CKD (chronic kidney disease)  Severe protein-calorie malnutrition  Sacral decubitus ulcer, stage III  Anemia of chronic disease   Leukocytosis        [name]  [facility] Pager [phone]. If 8PM-8AM, please contact night-coverage at [email or web address], password [facility] 09/12/2012, 11:25 AM  LOS: 1 day

## 2012-09-13 DIAGNOSIS — D72829 Elevated white blood cell count, unspecified: Secondary | ICD-10-CM

## 2012-09-13 LAB — BASIC METABOLIC PANEL
CO2: 18 mEq/L — ABNORMAL LOW (ref 19–32)
Chloride: 106 mEq/L (ref 96–112)
Glucose, Bld: 165 mg/dL — ABNORMAL HIGH (ref 70–99)
Potassium: 3.3 mEq/L — ABNORMAL LOW (ref 3.5–5.1)
Sodium: 132 mEq/L — ABNORMAL LOW (ref 135–145)

## 2012-09-13 NOTE — Progress Notes (Signed)
[facility] PROGRESS NOTE  [name] [name] ZOX:[id number] DOB: 1919/08/29 DOA: 09/11/2012 PCP: Estill Cotta, MD  Assessment/Plan: 1.  *UTI (lower urinary tract infection)  - continue rocephin 1 gm Q 24 hours IV  - plan to narrow down in 1-2 days based on pt clinical response  - urine cultures pending Active Problems:  Hypothyroidism  -  TSH level normal - continue levothyroxine  Depression with anxiety  - may continue sertraline per home regimen  HTN (hypertension)  - hypotensive on admission  - we will hold all B meds for now  - continue IV fluids  Metabolic encephalopathy  - likely due to UTI  - regimen as above with rocephin. - she appears to be at baseline.   CKD (chronic kidney disease)  - cratinine already trending down since admission 1.58 to 1.33 - continue IV fluids  - follow up BMP in am  Severe protein-calorie malnutrition  - nutrition consult ordered  Sacral decubitus ulcer, stage III  - does not look infected  - wound care consult ordered  Anemia of chronic disease  - hemoglobin 10.8 and then 9.9  To 8.3, will get anemia panel and stool for occult blood to see if she has microscopic bleeding.  - no signs of active bleed  - will follow up CBC in am  Leukocytosis  - secondary to UTI and c diff  - follow up CBC in am  - continue IV rocephin for UTI and started her on oral vancomycin.   Diarrhea: - c diff pcr positive - started her on oral vancomycin.   Code Status: DNR Family Communication: none at bedside Disposition Plan: PENDING PT/OT EVAL.   Consultants:  Wound care consult.   Procedures:  none  Antibiotics:  Rocephin -10/3>  HPI/Subjective: No new complaints.   Objective: Filed Vitals:   09/12/12 0605 09/12/12 1400 09/12/12 2135 09/13/12 0701  BP: 95/33 104/39 96/38 106/40  Pulse: 60 65 68 59  Temp: 98.5 F (36.9 C) 98.6 F (37 C) 97.7 F (36.5 C) 98.4 F (36.9 C)  TempSrc: Oral Oral Oral Oral  Resp: 16 18  20 20   Height:      Weight: 58.9 kg (129 lb 13.6 oz)     SpO2: 91% 96% 94% 90%    Intake/Output Summary (Last 24 hours) at 09/13/12 1011 Last data filed at 09/13/12 0843  Gross per 24 hour  Intake   2350 ml  Output    502 ml  Net   1848 ml   Filed Weights   09/11/12 1645 09/12/12 0605  Weight: 49.442 kg (109 lb) 58.9 kg (129 lb 13.6 oz)    Exam: Constitutional: alert comfortable.  CVS: RRR, S1/S2 +, no murmurs, no gallops, no carotid bruit.  Pulmonary: Effort and breath sounds normal, no stridor, rhonchi, wheezes, rales.  Abdominal: Soft. BS +, no distension, tenderness, rebound or guarding.  Musculoskeletal: Normal range of motion. No edema and no tenderness Skin: Skin is warm and dry. Stage III sacral decubitus ulcer    Data Reviewed: Basic Metabolic Panel:  Lab 09/12/12 8119 09/11/12 1717 09/11/12 1348 09/09/12 1434  NA 133* 128* 132* 134*  K 3.1* 3.2* 3.7 3.9  CL 104 97 99 102  CO2 19 20 19  18*  GLUCOSE 94 93 104* 92  BUN 30* 34* 39* 43*  CREATININE 1.33* 1.42* 1.58* 1.74*  CALCIUM 7.2* 7.7* 8.0* 8.1*  MG -- 2.2 -- --  PHOS -- 2.7 -- --   Liver Function Tests:  Lab 09/12/12 0315 09/11/12 1717 09/09/12 1434  AST 12 17 23   ALT 14 16 23   ALKPHOS 317* 386* 460*  BILITOT 0.5 0.5 0.9  PROT 5.1* 5.8* 6.5  ALBUMIN 1.6* 1.9* 2.8*   No results found for this basename: LIPASE:5,AMYLASE:5 in the last 168 hours No results found for this basename: AMMONIA:5 in the last 168 hours CBC:  Lab 09/12/12 0315 09/11/12 1348 09/09/12 1434  WBC 30.4* 31.1* 21.5*  NEUTROABS -- 29.0* 19.0*  HGB 8.3* 9.9* 10.8*  HCT 23.5* 28.6* 30.0*  MCV 89.0 90.5 93.2  PLT 294 332 442*   Cardiac Enzymes: No results found for this basename: CKTOTAL:5,CKMB:5,CKMBINDEX:5,TROPONINI:5 in the last 168 hours BNP (last 3 results) No results found for this basename: PROBNP:3 in the last 8760 hours CBG:  Lab 09/13/12 0812 09/12/12 0742  GLUCAP 98 91    Recent Results (from the past 240  hour(s))  CLOSTRIDIUM DIFFICILE BY PCR     Status: Abnormal   Collection Time   09/12/12  3:41 AM      Component Value Range Status Comment   C difficile by pcr POSITIVE (*) NEGATIVE Final      Studies: Dg Chest Portable 1 View  09/11/2012  *RADIOLOGY REPORT*  Clinical Data: 78 year old female altered mental status. Leukocytosis.  Urinary tract infection.  PORTABLE CHEST - 1 VIEW  Comparison: 03/17/2012.  Findings: Portable semi upright AP view 1303 hours.  Kyphotic view and low lung volumes.  Mild bibasilar opacity most resembles atelectasis.  No pneumothorax or edema.  No pleural effusion or definite consolidation. Stable cardiomegaly and mediastinal contours.  IMPRESSION: Low lung volumes with atelectasis.   Original Report Authenticated By: Harley Hallmark, M.D.     Scheduled Meds:    . cefTRIAXone (ROCEPHIN) IVPB 1 gram/50 mL D5W  1 g Intravenous Q24H  . collagenase   Topical Daily  . feeding supplement  237 mL Oral BID BM  . fluticasone  2 spray Each Nare Daily  . gabapentin  300 mg Oral Daily  . levothyroxine  88 mcg Oral Daily  . magnesium oxide  400 mg Oral Daily  . metronidazole  500 mg Intravenous Q8H  . potassium chloride  40 mEq Oral BID  . rOPINIRole  1 mg Oral Q12H  . rOPINIRole  2 mg Oral Q12H  . sertraline  100 mg Oral Daily  . vancomycin  125 mg Oral QID  . vitamin B-12  1,000 mcg Oral Daily  . DISCONTD: pantoprazole  80 mg Oral Daily   Continuous Infusions:    . sodium chloride 75 mL/hr at 09/12/12 2006    Principal Problem:  *UTI (lower urinary tract infection) Active Problems:  Hypothyroidism  Depression with anxiety  HTN (hypertension)  Metabolic encephalopathy  CKD (chronic kidney disease)  Severe protein-calorie malnutrition  Sacral decubitus ulcer, stage III  Anemia of chronic disease  Leukocytosis  C. difficile diarrhea        [name] [name]  [facility] Pager [phone]. If 8PM-8AM, please contact night-coverage at [email or web address],  password [facility] 09/13/2012, 10:11 AM  LOS: 2 days

## 2012-09-14 LAB — GLUCOSE, CAPILLARY

## 2012-09-14 LAB — BASIC METABOLIC PANEL
BUN: 16 mg/dL (ref 6–23)
Chloride: 106 mEq/L (ref 96–112)
GFR calc Af Amer: 69 mL/min — ABNORMAL LOW (ref >90)
Glucose, Bld: 108 mg/dL — ABNORMAL HIGH (ref 70–99)
Potassium: 3.9 mEq/L (ref 3.5–5.1)
Sodium: 135 mEq/L (ref 135–145)

## 2012-09-14 LAB — URINE CULTURE: Colony Count: NO GROWTH

## 2012-09-14 LAB — CBC
HCT: 28 % — ABNORMAL LOW (ref 36.0–46.0)
Hemoglobin: 9.8 g/dL — ABNORMAL LOW (ref 12.0–15.0)
MCH: 31.2 pg (ref 26.0–34.0)
MCHC: 35 g/dL (ref 30.0–36.0)
RBC: 3.14 MIL/uL — ABNORMAL LOW (ref 3.87–5.11)

## 2012-09-14 NOTE — Progress Notes (Signed)
[facility] PROGRESS NOTE  Kristopher Delk QIO:[id number] DOB: [birth year] DOA: 09/11/2012 PCP: Estill Cotta, MD  Assessment/Plan: 1.  *UTI (lower urinary tract infection)   - urine cultures negative , will d/c rocephin.  Active Problems:  Hypothyroidism  -  TSH level normal - continue levothyroxine  Depression with anxiety  - may continue sertraline per home regimen  HTN (hypertension)  - hypotensive on admission  - we will hold all B meds for now  - continue IV fluids  Metabolic encephalopathy  - likely due to UTI  - regimen as above with rocephin. - she appears to be at baseline.   CKD (chronic kidney disease)  - cratinine already trending down since admission 1.58 to 1.33 - continue IV fluids  - follow up BMP in am  Severe protein-calorie malnutrition  - nutrition consult ordered  Sacral decubitus ulcer, stage III  - does not look infected  - wound care consult ordered and dressings as recommended.  Anemia of chronic disease  - hemoglobin 10.8 and then 9.9  To 8.3, will get anemia panel and stool for occult blood to see if she has microscopic bleeding.  - no signs of active bleed  - will follow up CBC in am  Leukocytosis  - secondary to  c diff and improving. - follow up CBC in am  - continue oral vancomycin.   Diarrhea: - c diff pcr positive - started her on oral vancomycin.  - ordered a rectal tube as she has watery diarrhea an d a stage 3 decubitus ulcer, and to prevent contamination of the ulcer.   Code Status: DNR Family Communication: none at bedside Disposition Plan: PENDING PT/OT EVAL.   Consultants:  Wound care consult.   Procedures:  none  Antibiotics:  Vancomycin 10/4>>  HPI/Subjective: No new complaints.   Objective: Filed Vitals:   09/13/12 1400 09/13/12 2204 09/14/12 0158 09/14/12 0420  BP: 103/40 104/42  131/45  Pulse: 60 60  62  Temp: 98.2 F (36.8 C) 97.9 F (36.6 C)  97.5 F (36.4 C)  TempSrc: Oral Oral   Oral  Resp: 18 18  18   Height:      Weight:   56.269 kg (124 lb 0.8 oz)   SpO2: 94% 98%  96%    Intake/Output Summary (Last 24 hours) at 09/14/12 1616 Last data filed at 09/14/12 0900  Gross per 24 hour  Intake   2520 ml  Output    350 ml  Net   2170 ml   Filed Weights   09/11/12 1645 09/12/12 0605 09/14/12 0158  Weight: 49.442 kg (109 lb) 58.9 kg (129 lb 13.6 oz) 56.269 kg (124 lb 0.8 oz)    Exam: Constitutional: alert comfortable.  CVS: RRR, S1/S2 +, no murmurs, no gallops, no carotid bruit.  Pulmonary: Effort and breath sounds normal, no stridor, rhonchi, wheezes, rales.  Abdominal: Soft. BS +, no distension, tenderness, rebound or guarding.  Musculoskeletal: Normal range of motion. No edema and no tenderness Skin: Skin is warm and dry. Stage III sacral decubitus ulcer    Data Reviewed: Basic Metabolic Panel:  Lab 09/14/12 3244 09/13/12 1110 09/12/12 0315 09/11/12 1717 09/11/12 1348  NA 135 132* 133* 128* 132*  K 3.9 3.3* 3.1* 3.2* 3.7  CL 106 106 104 97 99  CO2 18* 18* 19 20 19   GLUCOSE 108* 165* 94 93 104*  BUN 16 20 30* 34* 39*  CREATININE 0.84 1.02 1.33* 1.42* 1.58*  CALCIUM 7.4* 7.1* 7.2* 7.7*  8.0*  MG -- -- -- 2.2 --  PHOS -- -- -- 2.7 --   Liver Function Tests:  Lab 09/12/12 0315 09/11/12 1717 09/09/12 1434  AST 12 17 23   ALT 14 16 23   ALKPHOS 317* 386* 460*  BILITOT 0.5 0.5 0.9  PROT 5.1* 5.8* 6.5  ALBUMIN 1.6* 1.9* 2.8*   No results found for this basename: LIPASE:5,AMYLASE:5 in the last 168 hours No results found for this basename: AMMONIA:5 in the last 168 hours CBC:  Lab 09/14/12 0414 09/12/12 0315 09/11/12 1348 09/09/12 1434  WBC 23.2* 30.4* 31.1* 21.5*  NEUTROABS -- -- 29.0* 19.0*  HGB 9.8* 8.3* 9.9* 10.8*  HCT 28.0* 23.5* 28.6* 30.0*  MCV 89.2 89.0 90.5 93.2  PLT 324 294 332 442*   Cardiac Enzymes: No results found for this basename: CKTOTAL:5,CKMB:5,CKMBINDEX:5,TROPONINI:5 in the last 168 hours BNP (last 3 results) No results  found for this basename: PROBNP:3 in the last 8760 hours CBG:  Lab 09/14/12 0746 09/13/12 0812 09/12/12 0742  GLUCAP 83 98 91    Recent Results (from the past 240 hour(s))  URINE CULTURE     Status: Normal   Collection Time   09/11/12  1:53 PM      Component Value Range Status Comment   Specimen Description URINE, CATHETERIZED   Final    Special Requests NONE   Final    Culture  Setup Time 09/13/2012 01:27   Final    Colony Count NO GROWTH   Final    Culture NO GROWTH   Final    Report Status 09/14/2012 FINAL   Final   CLOSTRIDIUM DIFFICILE BY PCR     Status: Abnormal   Collection Time   09/12/12  3:41 AM      Component Value Range Status Comment   C difficile by pcr POSITIVE (*) NEGATIVE Final      Studies: No results found.  Scheduled Meds:    . cefTRIAXone (ROCEPHIN) IVPB 1 gram/50 mL D5W  1 g Intravenous Q24H  . collagenase   Topical Daily  . feeding supplement  237 mL Oral BID BM  . fluticasone  2 spray Each Nare Daily  . gabapentin  300 mg Oral Daily  . levothyroxine  88 mcg Oral Daily  . magnesium oxide  400 mg Oral Daily  . metronidazole  500 mg Intravenous Q8H  . rOPINIRole  1 mg Oral Q12H  . rOPINIRole  2 mg Oral Q12H  . sertraline  100 mg Oral Daily  . vancomycin  125 mg Oral QID  . vitamin B-12  1,000 mcg Oral Daily   Continuous Infusions:    . sodium chloride 75 mL/hr at 09/14/12 6213    Principal Problem:  *UTI (lower urinary tract infection) Active Problems:  Hypothyroidism  Depression with anxiety  HTN (hypertension)  Metabolic encephalopathy  CKD (chronic kidney disease)  Severe protein-calorie malnutrition  Sacral decubitus ulcer, stage III  Anemia of chronic disease  Leukocytosis  C. difficile diarrhea        Charan Prieto  [facility] Pager [phone]. If 8PM-8AM, please contact night-coverage at [email or web address], password [facility] 09/14/2012, 4:16 PM  LOS: 3 days

## 2012-09-15 LAB — BASIC METABOLIC PANEL
CO2: 19 mEq/L (ref 19–32)
Chloride: 105 mEq/L (ref 96–112)
Creatinine, Ser: 0.74 mg/dL (ref 0.50–1.10)
GFR calc Af Amer: 84 mL/min — ABNORMAL LOW (ref >90)
Potassium: 3 mEq/L — ABNORMAL LOW (ref 3.5–5.1)

## 2012-09-15 LAB — CBC
HCT: 27.4 % — ABNORMAL LOW (ref 36.0–46.0)
Hemoglobin: 9.8 g/dL — ABNORMAL LOW (ref 12.0–15.0)
MCV: 86.4 fL (ref 78.0–100.0)
RDW: 14.7 % (ref 11.5–15.5)
WBC: 15 10*3/uL — ABNORMAL HIGH (ref 4.0–10.5)

## 2012-09-15 LAB — GLUCOSE, CAPILLARY

## 2012-09-15 MED ORDER — POTASSIUM CHLORIDE 10 MEQ/100ML IV SOLN
10.0000 meq | INTRAVENOUS | Status: AC
  Administered 2012-09-15 (×4): 10 meq via INTRAVENOUS
  Filled 2012-09-15 (×4): qty 100

## 2012-09-15 NOTE — Progress Notes (Signed)
Physical Therapy Treatment Patient Details Name: [name] [name] MRN: [id number] DOB: October 13, 1919 Today's Date: 09/15/2012 Time: 0865-7846 PT Time Calculation (min): 24 min  PT Assessment / Plan / Recommendation Comments on Treatment Session  Pt has had continued diarrhea, but is able to get to edge of bed and ambulate with RW Still feel she will be able to d/c to her home at ALF if staff can provide some additional assist initally, If not, may need SNF    Follow Up Recommendations  Home health PT (depends on level of assist ALF can provide)     Does the patient have the potential to tolerate intense rehabilitation  No, Recommend SNF  Barriers to Discharge        Equipment Recommendations  None recommended by PT    Recommendations for Other Services    Frequency Min 3X/week   Plan Discharge plan remains appropriate    Precautions / Restrictions Precautions Precaution Comments: flexiseal tube in place  Restrictions Weight Bearing Restrictions: No   Pertinent Vitals/Pain Pt now with flexiseal    Mobility  Bed Mobility Bed Mobility: Supine to Sit;Sit to Supine Supine to Sit: 1: +2 Total assist Supine to Sit: Patient Percentage: 60% Sit to Supine: 1: +2 Total assist Sit to Supine: Patient Percentage: 40% Details for Bed Mobility Assistance: pt with some difficulty due to air mattress Needs assist to bring legs off of bed and to put them back up onto bed Transfers Transfers: Sit to Stand;Stand to Sit Sit to Stand: 4: Min assist Stand to Sit: 4: Min assist Details for Transfer Assistance: assist to bring weight forward onto feet Ambulation/Gait Ambulation/Gait Assistance: 4: Min assist Ambulation Distance (Feet): 15 Feet Assistive device: Rolling walker Ambulation/Gait Assistance Details: improved steadiness on feet and advancement of walker Gait Pattern: Step-to pattern;Decreased step length - right;Decreased step length - left;Trunk flexed (marked kyphosis) Gait  velocity: decreased General Gait Details: pt needs support for standing and advancing RW,but she is able to weight shift and advance legs Stairs: No Wheelchair Mobility Wheelchair Mobility: No    Exercises     PT Diagnosis:    PT Problem List:   PT Treatment Interventions:     PT Goals Acute Rehab PT Goals PT Goal Formulation: With patient Time For Goal Achievement: 09/26/12 Potential to Achieve Goals: Good Pt will go Supine/Side to Sit: with modified independence PT Goal: Supine/Side to Sit - Progress: Progressing toward goal Pt will go Sit to Supine/Side: with modified independence PT Goal: Sit to Supine/Side - Progress: Progressing toward goal Pt will go Sit to Stand: with modified independence PT Goal: Sit to Stand - Progress: Progressing toward goal Pt will go Stand to Sit: with modified independence PT Goal: Stand to Sit - Progress: Progressing toward goal Pt will Ambulate: 51 - 150 feet;with modified independence;with least restrictive assistive device PT Goal: Ambulate - Progress: Progressing toward goal  Visit Information  Last PT Received On: 09/15/12 Assistance Needed: +2 Reason Eval/Treat Not Completed: Fatigue/lethargy limiting ability to participate    Subjective Data  Subjective: OK Patient Stated Goal: agreeable to walk   Cognition  Overall Cognitive Status: Appears within functional limits for tasks assessed/performed Arousal/Alertness: Awake/alert Orientation Level: Appears intact for tasks assessed Behavior During Session: [facility] for tasks performed    Balance  Balance Balance Assessed: Yes Static Sitting Balance Static Sitting - Balance Support: Bilateral upper extremity supported Static Sitting - Level of Assistance: 5: Stand by assistance Static Sitting - Comment/# of Minutes: 3 Static Standing Balance  Static Standing - Balance Support: Bilateral upper extremity supported;During functional activity (on RW) Static Standing - Level of Assistance:  5: Stand by assistance Static Standing - Comment/# of Minutes: 5 minutes with standing, then taking small steps forward with either hand and raising one arm up at a time.  End of Session PT - End of Session Activity Tolerance: Patient tolerated treatment well Patient left: in bed;with nursing in room Nurse Communication: Mobility status   GP    Bayard Hugger. [place], [place] [phone] 09/15/2012, 1:49 PM

## 2012-09-15 NOTE — Progress Notes (Signed)
[facility] PROGRESS NOTE  [name] UXL:[id number] DOB: 1919/01/28 DOA: 09/11/2012 PCP: Estill Cotta, MD  Assessment/Plan: 1.  *UTI (lower urinary tract infection)   - urine cultures negative , will d/c rocephin.  Active Problems:  Hypothyroidism  -  TSH level normal - continue levothyroxine  Depression with anxiety  - may continue sertraline per home regimen  HTN (hypertension)  - hypotensive on admission  - we will hold all B meds for now  - continue IV fluids  Metabolic encephalopathy  - likely from c diff diarrhea. - she appears to be at baseline.   CKD (chronic kidney disease)  - cratinine already trending down since admission 1.58 to 1.33 to normal - continue IV fluids  - follow up BMP in am  Severe protein-calorie malnutrition  - nutrition consult ordered  Sacral decubitus ulcer, stage III  - does not look infected  - wound care consult ordered and dressings as recommended.  Anemia of chronic disease  - hemoglobin 10.8 and then 9.9  To 8.3, will get anemia panel and stool for occult blood to see if she has microscopic bleeding.  - no signs of active bleed  - will follow up CBC in am  Leukocytosis  - secondary to  c diff and improving. - follow up CBC in am  - continue oral vancomycin.   Diarrhea: - c diff pcr positive - started her on oral vancomycin.  - ordered a rectal tube as she has watery diarrhea an d a stage 3 decubitus ulcer, and to prevent contamination of the ulcer.   Code Status: DNR Family Communication: none at bedside Disposition Plan: PENDING PT/OT EVAL.   Consultants:  Wound care consult.   Procedures:  none  Antibiotics:  Vancomycin 10/4>>  HPI/Subjective: No new complaints.   Objective: Filed Vitals:   09/14/12 0420 09/14/12 1700 09/14/12 2148 09/15/12 0611  BP: 131/45 118/42 134/67 126/50  Pulse: 62 60 61 75  Temp: 97.5 F (36.4 C) 98 F (36.7 C) 97.1 F (36.2 C) 97.6 F (36.4 C)  TempSrc:  Oral  Oral Oral  Resp: 18 16 20 16   Height:      Weight:      SpO2: 96% 97% 96% 98%    Intake/Output Summary (Last 24 hours) at 09/15/12 0928 Last data filed at 09/15/12 0900  Gross per 24 hour  Intake 1433.75 ml  Output    300 ml  Net 1133.75 ml   Filed Weights   09/11/12 1645 09/12/12 0605 09/14/12 0158  Weight: 49.442 kg (109 lb) 58.9 kg (129 lb 13.6 oz) 56.269 kg (124 lb 0.8 oz)    Exam: Constitutional: alert comfortable.  CVS: RRR, S1/S2 +, no murmurs, no gallops, no carotid bruit.  Pulmonary: Effort and breath sounds normal, no stridor, rhonchi, wheezes, rales.  Abdominal: Soft. BS +, no distension, tenderness, rebound or guarding.  Musculoskeletal: Normal range of motion. No edema and no tenderness Skin: Skin is warm and dry. Stage III sacral decubitus ulcer    Data Reviewed: Basic Metabolic Panel:  Lab 09/15/12 5366 09/14/12 0414 09/13/12 1110 09/12/12 0315 09/11/12 1717  NA 132* 135 132* 133* 128*  K 3.0* 3.9 3.3* 3.1* 3.2*  CL 105 106 106 104 97  CO2 19 18* 18* 19 20  GLUCOSE 78 108* 165* 94 93  BUN 11 16 20  30* 34*  CREATININE 0.74 0.84 1.02 1.33* 1.42*  CALCIUM 7.1* 7.4* 7.1* 7.2* 7.7*  MG -- -- -- -- 2.2  PHOS -- -- -- --  2.7   Liver Function Tests:  Lab 09/12/12 0315 09/11/12 1717 09/09/12 1434  AST 12 17 23   ALT 14 16 23   ALKPHOS 317* 386* 460*  BILITOT 0.5 0.5 0.9  PROT 5.1* 5.8* 6.5  ALBUMIN 1.6* 1.9* 2.8*   No results found for this basename: LIPASE:5,AMYLASE:5 in the last 168 hours No results found for this basename: AMMONIA:5 in the last 168 hours CBC:  Lab 09/15/12 0340 09/14/12 0414 09/12/12 0315 09/11/12 1348 09/09/12 1434  WBC 15.0* 23.2* 30.4* 31.1* 21.5*  NEUTROABS -- -- -- 29.0* 19.0*  HGB 9.8* 9.8* 8.3* 9.9* 10.8*  HCT 27.4* 28.0* 23.5* 28.6* 30.0*  MCV 86.4 89.2 89.0 90.5 93.2  PLT 320 324 294 332 442*   Cardiac Enzymes: No results found for this basename: CKTOTAL:5,CKMB:5,CKMBINDEX:5,TROPONINI:5 in the last 168  hours BNP (last 3 results) No results found for this basename: PROBNP:3 in the last 8760 hours CBG:  Lab 09/15/12 0753 09/14/12 0746 09/13/12 0812 09/12/12 0742  GLUCAP 88 83 98 91    Recent Results (from the past 240 hour(s))  URINE CULTURE     Status: Normal   Collection Time   09/11/12  1:53 PM      Component Value Range Status Comment   Specimen Description URINE, CATHETERIZED   Final    Special Requests NONE   Final    Culture  Setup Time 09/13/2012 01:27   Final    Colony Count NO GROWTH   Final    Culture NO GROWTH   Final    Report Status 09/14/2012 FINAL   Final   CLOSTRIDIUM DIFFICILE BY PCR     Status: Abnormal   Collection Time   09/12/12  3:41 AM      Component Value Range Status Comment   C difficile by pcr POSITIVE (*) NEGATIVE Final      Studies: No results found.  Scheduled Meds:    . collagenase   Topical Daily  . feeding supplement  237 mL Oral BID BM  . fluticasone  2 spray Each Nare Daily  . gabapentin  300 mg Oral Daily  . levothyroxine  88 mcg Oral Daily  . magnesium oxide  400 mg Oral Daily  . metronidazole  500 mg Intravenous Q8H  . potassium chloride  10 mEq Intravenous Q1 Hr x 4  . rOPINIRole  1 mg Oral Q12H  . rOPINIRole  2 mg Oral Q12H  . sertraline  100 mg Oral Daily  . vancomycin  125 mg Oral QID  . vitamin B-12  1,000 mcg Oral Daily  . DISCONTD: cefTRIAXone (ROCEPHIN) IVPB 1 gram/50 mL D5W  1 g Intravenous Q24H   Continuous Infusions:    . sodium chloride 1,000 mL (09/14/12 1723)    Principal Problem:  *UTI (lower urinary tract infection) Active Problems:  Hypothyroidism  Depression with anxiety  HTN (hypertension)  Metabolic encephalopathy  CKD (chronic kidney disease)  Severe protein-calorie malnutrition  Sacral decubitus ulcer, stage III  Anemia of chronic disease  Leukocytosis  C. difficile diarrhea        [name] [name]  [facility] Pager [phone]. If 8PM-8AM, please contact night-coverage at  [email or web address], password [facility] 09/15/2012, 9:28 AM  LOS: 4 days

## 2012-09-15 NOTE — Progress Notes (Signed)
Pt. Repositioned q 2 hr. Santyl applied to decubitus with 2x2 packing and a 2x2 and avelyn. Pt. Sleeping off and on all day. Flexiseal was irrigated with 30 cc water, it drained 50cc liquid brown stool. CBG-88 @ 8:00. Appetite poor.

## 2012-09-15 NOTE — Plan of Care (Signed)
Problem: Phase I Progression Outcomes Goal: Adequate I & O Outcome: Not Progressing Poor PO intake

## 2012-09-15 NOTE — Progress Notes (Signed)
Patient admitted from [facility]- has flexi-seal (cdiff). Message left at ALF for callback to update and determine if they can accept back with possible flexi-seal and cdiff as well as to update on her PT eval and recommendations. Will update once we hear from ALF.  Reece Levy, MSW, Theresia Majors [phone]

## 2012-09-15 NOTE — Plan of Care (Signed)
Problem: Phase I Progression Outcomes Goal: Voiding-avoid urinary catheter unless indicated Outcome: Not Progressing Pt incontinent

## 2012-09-15 NOTE — Plan of Care (Signed)
Problem: Phase I Progression Outcomes Goal: OOB as tolerated unless otherwise ordered Outcome: Progressing PT worked with pt. today

## 2012-09-15 NOTE — Plan of Care (Signed)
Problem: Phase I Progression Outcomes Goal: Hemodynamically stable Outcome: Progressing Pt. Still on c-diff precautions

## 2012-09-16 DIAGNOSIS — G9341 Metabolic encephalopathy: Secondary | ICD-10-CM

## 2012-09-16 DIAGNOSIS — L8993 Pressure ulcer of unspecified site, stage 3: Secondary | ICD-10-CM

## 2012-09-16 DIAGNOSIS — L89109 Pressure ulcer of unspecified part of back, unspecified stage: Secondary | ICD-10-CM

## 2012-09-16 LAB — CBC
HCT: 27.3 % — ABNORMAL LOW (ref 36.0–46.0)
Hemoglobin: 9.7 g/dL — ABNORMAL LOW (ref 12.0–15.0)
MCH: 31.3 pg (ref 26.0–34.0)
MCV: 88.1 fL (ref 78.0–100.0)
Platelets: 308 10*3/uL (ref 150–400)
RBC: 3.1 MIL/uL — ABNORMAL LOW (ref 3.87–5.11)
WBC: 13.9 10*3/uL — ABNORMAL HIGH (ref 4.0–10.5)

## 2012-09-16 LAB — BASIC METABOLIC PANEL
CO2: 18 mEq/L — ABNORMAL LOW (ref 19–32)
Calcium: 7.2 mg/dL — ABNORMAL LOW (ref 8.4–10.5)
Chloride: 105 mEq/L (ref 96–112)
Glucose, Bld: 88 mg/dL (ref 70–99)
Potassium: 3.3 mEq/L — ABNORMAL LOW (ref 3.5–5.1)
Sodium: 132 mEq/L — ABNORMAL LOW (ref 135–145)

## 2012-09-16 MED ORDER — POTASSIUM CHLORIDE 10 MEQ/100ML IV SOLN
10.0000 meq | INTRAVENOUS | Status: AC
  Administered 2012-09-16 (×2): 10 meq via INTRAVENOUS
  Filled 2012-09-16 (×2): qty 100

## 2012-09-16 NOTE — Progress Notes (Signed)
Physical Therapy Treatment Patient Details Name: [name] [name] MRN: [id number] DOB: 1919-01-28 Today's Date: 09/16/2012 Time: 4098-1191 PT Time Calculation (min): 24 min  PT Assessment / Plan / Recommendation Comments on Treatment Session  Pt continues to need +2 assist due to flexiseal, postural deformities and general deconditioning. ALF will need to provide additional assistance initially.  If patient continues to have diarrhea and deconditioning she may need ST SNF prior to return to SNF    Follow Up Recommendations  Home health PT;Post acute inpatient (depends on level of assist ALF can provide)     Does the patient have the potential to tolerate intense rehabilitation  No, Recommend SNF  Barriers to Discharge        Equipment Recommendations  None recommended by PT    Recommendations for Other Services    Frequency Min 3X/week   Plan Discharge plan remains appropriate    Precautions / Restrictions Precautions Precaution Comments: flexiseal tube in place  Restrictions Weight Bearing Restrictions: No   Pertinent Vitals/Pain No c/o pain    Mobility  Bed Mobility Bed Mobility: Supine to Sit;Sit to Supine Supine to Sit: 1: +2 Total assist Supine to Sit: Patient Percentage: 60% Sit to Supine: 1: +2 Total assist Sit to Supine: Patient Percentage: 40% Details for Bed Mobility Assistance: pt with some difficulty due to air mattress Needs assist to bring legs off of bed and to put them back up onto bed Transfers Transfers: Sit to Stand;Stand to Sit Sit to Stand: 4: Min assist Stand to Sit: 4: Min assist Details for Transfer Assistance: repeated sit to stand > 5 times for strengtheing Ambulation/Gait Ambulation/Gait Assistance: 4: Min assist Ambulation Distance (Feet): 15 Feet Assistive device: Rolling walker Gait Pattern: Step-to pattern;Decreased step length - right;Decreased step length - left;Trunk flexed (marked kyphosis) Gait velocity: decreased General Gait  Details: Pt continues with forward head and kyphosis, but is able to stand and advance legs.  Stairs: No Wheelchair Mobility Wheelchair Mobility: No    Exercises General Exercises - Lower Extremity Ankle Circles/Pumps: AROM;Both;5 reps;Supine Hip ABduction/ADduction: AROM;Both;10 reps;Supine Straight Leg Raises: AROM;Both;10 reps;Supine Hip Flexion/Marching: AROM;Both;10 reps;Supine   PT Diagnosis:    PT Problem List:   PT Treatment Interventions:     PT Goals Acute Rehab PT Goals PT Goal Formulation: With patient Time For Goal Achievement: 09/26/12 Potential to Achieve Goals: Good Pt will go Supine/Side to Sit: with modified independence PT Goal: Supine/Side to Sit - Progress: Progressing toward goal Pt will go Sit to Supine/Side: with modified independence PT Goal: Sit to Supine/Side - Progress: Progressing toward goal Pt will go Sit to Stand: with modified independence PT Goal: Sit to Stand - Progress: Progressing toward goal Pt will go Stand to Sit: with modified independence PT Goal: Stand to Sit - Progress: Progressing toward goal Pt will Ambulate: 51 - 150 feet;with modified independence;with least restrictive assistive device PT Goal: Ambulate - Progress: Progressing toward goal  Visit Information  Last PT Received On: 09/16/12 Assistance Needed: +2    Subjective Data  Subjective: OK Patient Stated Goal: pt agreeable to walk   Cognition  Overall Cognitive Status: Appears within functional limits for tasks assessed/performed Arousal/Alertness: Awake/alert Orientation Level: Appears intact for tasks assessed Behavior During Session: [facility] for tasks performed    Balance     End of Session PT - End of Session Activity Tolerance: Patient tolerated treatment well Patient left: in bed;with nursing in room Nurse Communication: Mobility status   GP     Rosey Bath  Lennox Pippins, PT [phone] 09/16/2012, 3:49 PM

## 2012-09-16 NOTE — Progress Notes (Signed)
[facility] PROGRESS NOTE  [name] [name] ZOX:[id number] DOB: November 02, 1919 DOA: 09/11/2012 PCP: Estill Cotta, MD  Assessment/Plan: 1.  *UTI (lower urinary tract infection)   - urine cultures negative , will d/c rocephin.  Active Problems:  Hypothyroidism  -  TSH level normal - continue levothyroxine  Depression with anxiety  - may continue sertraline per home regimen  HTN (hypertension)  - hypotensive on admission secondary to sepsis, initially all her blood pressure medications were held, her bp over the last 24 hours has beenaroud 140/60's , will resume her BP medications.  - continue IV fluids  Metabolic encephalopathy  - resolved. Probably from acute renal failure and c diff infection.  - she appears to be at baseline.   CKD (chronic kidney disease)  - cratinine already trending down since admission 1.58 to 1.33 to normal - continue IV fluids  - follow up BMP in am  Severe protein-calorie malnutrition  - nutrition consult ordered  Sacral decubitus ulcer, stage III  - does not look infected  - wound care consult ordered and dressings as recommended.  Anemia of chronic disease  - hemoglobin 10.8 and then 9.9  To 8.3, will get anemia panel and stool for occult blood to see if she has microscopic bleeding.  - no signs of active bleed  - will follow up CBC in am  Leukocytosis  - secondary to  c diff and improving. - follow up CBC in am  - continue oral vancomycin.   Diarrhea: - c diff pcr positive - started her on oral vancomycin.  - ordered a rectal tube as she has watery diarrhea an d a stage 3 decubitus ulcer, and to prevent contamination of the ulcer.   Code Status: DNR Family Communication: none at bedside Disposition Plan: snf when medically stable.    Consultants:  Wound care consult.   Procedures:  none  Antibiotics:  Vancomycin 10/4>>  HPI/Subjective: No new complaints.   Objective: Filed Vitals:   09/15/12 1419 09/15/12 2134  09/16/12 0505 09/16/12 1328  BP: 112/50 112/47 149/53 141/51  Pulse: 71 67 68 75  Temp: 98.2 F (36.8 C) 98.6 F (37 C) 98 F (36.7 C) 97.6 F (36.4 C)  TempSrc: Axillary Oral Oral Oral  Resp: 18 18 18 18   Height:      Weight:   57.267 kg (126 lb 4 oz)   SpO2: 97% 98% 95% 98%    Intake/Output Summary (Last 24 hours) at 09/16/12 1827 Last data filed at 09/16/12 1700  Gross per 24 hour  Intake    240 ml  Output   1250 ml  Net  -1010 ml   Filed Weights   09/12/12 0605 09/14/12 0158 09/16/12 0505  Weight: 58.9 kg (129 lb 13.6 oz) 56.269 kg (124 lb 0.8 oz) 57.267 kg (126 lb 4 oz)    Exam: Constitutional: alert comfortable.  CVS: RRR, S1/S2 +, no murmurs, no gallops, no carotid bruit.  Pulmonary: Effort and breath sounds normal, no stridor, rhonchi, wheezes, rales.  Abdominal: Soft. BS +, no distension, tenderness, rebound or guarding.  Musculoskeletal: Normal range of motion. No edema and no tenderness Skin: Skin is warm and dry. Stage III sacral decubitus ulcer    Data Reviewed: Basic Metabolic Panel:  Lab 09/16/12 8119 09/15/12 0340 09/14/12 0414 09/13/12 1110 09/12/12 0315 09/11/12 1717  NA 132* 132* 135 132* 133* --  K 3.3* 3.0* 3.9 3.3* 3.1* --  CL 105 105 106 106 104 --  CO2 18* 19  18* 18* 19 --  GLUCOSE 88 78 108* 165* 94 --  BUN 9 11 16 20  30* --  CREATININE 0.73 0.74 0.84 1.02 1.33* --  CALCIUM 7.2* 7.1* 7.4* 7.1* 7.2* --  MG -- -- -- -- -- 2.2  PHOS -- -- -- -- -- 2.7   Liver Function Tests:  Lab 09/12/12 0315 09/11/12 1717  AST 12 17  ALT 14 16  ALKPHOS 317* 386*  BILITOT 0.5 0.5  PROT 5.1* 5.8*  ALBUMIN 1.6* 1.9*   No results found for this basename: LIPASE:5,AMYLASE:5 in the last 168 hours No results found for this basename: AMMONIA:5 in the last 168 hours CBC:  Lab 09/16/12 0330 09/15/12 0340 09/14/12 0414 09/12/12 0315 09/11/12 1348  WBC 13.9* 15.0* 23.2* 30.4* 31.1*  NEUTROABS -- -- -- -- 29.0*  HGB 9.7* 9.8* 9.8* 8.3* 9.9*  HCT 27.3*  27.4* 28.0* 23.5* 28.6*  MCV 88.1 86.4 89.2 89.0 90.5  PLT 308 320 324 294 332   Cardiac Enzymes: No results found for this basename: CKTOTAL:5,CKMB:5,CKMBINDEX:5,TROPONINI:5 in the last 168 hours BNP (last 3 results) No results found for this basename: PROBNP:3 in the last 8760 hours CBG:  Lab 09/16/12 0746 09/15/12 0753 09/14/12 0746 09/13/12 0812 09/12/12 0742  GLUCAP 76 88 83 98 91    Recent Results (from the past 240 hour(s))  URINE CULTURE     Status: Normal   Collection Time   09/11/12  1:53 PM      Component Value Range Status Comment   Specimen Description URINE, CATHETERIZED   Final    Special Requests NONE   Final    Culture  Setup Time 09/13/2012 01:27   Final    Colony Count NO GROWTH   Final    Culture NO GROWTH   Final    Report Status 09/14/2012 FINAL   Final   CLOSTRIDIUM DIFFICILE BY PCR     Status: Abnormal   Collection Time   09/12/12  3:41 AM      Component Value Range Status Comment   C difficile by pcr POSITIVE (*) NEGATIVE Final      Studies: No results found.  Scheduled Meds:    . collagenase   Topical Daily  . feeding supplement  237 mL Oral BID BM  . fluticasone  2 spray Each Nare Daily  . gabapentin  300 mg Oral Daily  . levothyroxine  88 mcg Oral Daily  . magnesium oxide  400 mg Oral Daily  . metronidazole  500 mg Intravenous Q8H  . potassium chloride  10 mEq Intravenous Q1 Hr x 4  . potassium chloride  10 mEq Intravenous Q1 Hr x 2  . rOPINIRole  1 mg Oral Q12H  . rOPINIRole  2 mg Oral Q12H  . sertraline  100 mg Oral Daily  . vancomycin  125 mg Oral QID  . vitamin B-12  1,000 mcg Oral Daily   Continuous Infusions:    . sodium chloride 75 mL/hr at 09/16/12 0805    Principal Problem:  *UTI (lower urinary tract infection) Active Problems:  Hypothyroidism  Depression with anxiety  HTN (hypertension)  Metabolic encephalopathy  CKD (chronic kidney disease)  Severe protein-calorie malnutrition  Sacral decubitus ulcer, stage III   Anemia of chronic disease  Leukocytosis  C. difficile diarrhea        [name] [name]  [facility] Pager [phone]. If 8PM-8AM, please contact night-coverage at [email or web address], password [facility] 09/16/2012, 6:27 PM  LOS: 5 days

## 2012-09-16 NOTE — Progress Notes (Signed)
Contacted ALF, spoke with Alma and provided updated information on Pt's current medical condition.  Per Aniceto Boss, facility can accommodate Pt's rectal drain.  Facility able to accept Pt when medically ready for d/c.  Providence Crosby, LCSWA Clinical Social Work [phone]

## 2012-09-16 NOTE — Progress Notes (Signed)
Per MD, Pt not medically ready for d/c.  Amanda Massie Mees, LCSWA Clinical Social Work [phone]  

## 2012-09-17 DIAGNOSIS — E039 Hypothyroidism, unspecified: Secondary | ICD-10-CM

## 2012-09-17 LAB — CBC
HCT: 26.4 % — ABNORMAL LOW (ref 36.0–46.0)
Hemoglobin: 9.2 g/dL — ABNORMAL LOW (ref 12.0–15.0)
MCH: 30.6 pg (ref 26.0–34.0)
MCHC: 34.8 g/dL (ref 30.0–36.0)
RBC: 3.01 MIL/uL — ABNORMAL LOW (ref 3.87–5.11)

## 2012-09-17 LAB — BASIC METABOLIC PANEL
BUN: 7 mg/dL (ref 6–23)
CO2: 19 mEq/L (ref 19–32)
Chloride: 106 mEq/L (ref 96–112)
GFR calc non Af Amer: 75 mL/min — ABNORMAL LOW (ref >90)
Glucose, Bld: 91 mg/dL (ref 70–99)
Potassium: 3.5 mEq/L (ref 3.5–5.1)
Sodium: 133 mEq/L — ABNORMAL LOW (ref 135–145)

## 2012-09-17 LAB — GLUCOSE, CAPILLARY

## 2012-09-17 MED ORDER — VANCOMYCIN HCL 125 MG PO CAPS: 125.0000 mg | ORAL_CAPSULE | Freq: Four times a day (QID) | ORAL | Status: DC

## 2012-09-17 NOTE — Progress Notes (Signed)
Per MD, Pt ready for d/c.  Notified facility and [name].  [name] to provide transport.  Sent facility FL2 and d/c summary.  Facility ready to receive Pt.  Pt to be d/c'd.  Providence Crosby, LCSWA Clinical Social Work [phone]

## 2012-09-17 NOTE — Discharge Summary (Signed)
Physician Discharge Summary  Patient ID: [name] [name] MRN: [id number] DOB/AGE: 76-10-1919 76 y.o.  Admit date: 09/11/2012 Discharge date: 09/17/2012  Primary Care Physician:  Letitia Libra, Ala Dach, MD   Discharge Diagnoses:    Principal Problem:  *UTI (lower urinary tract infection) Active Problems:  Hypothyroidism  Depression with anxiety  HTN (hypertension)  Metabolic encephalopathy  CKD (chronic kidney disease)  Severe protein-calorie malnutrition  Sacral decubitus ulcer, stage III  Anemia of chronic disease  Leukocytosis  C. difficile diarrhea      Medication List     As of 09/17/2012  2:09 PM    STOP taking these medications         acetaminophen 325 MG tablet   Commonly known as: TYLENOL      ciprofloxacin 250 MG tablet   Commonly known as: CIPRO      furosemide 20 MG tablet   Commonly known as: LASIX      lidocaine 5 %   Commonly known as: LIDODERM      TAKE these medications         amLODipine 10 MG tablet   Commonly known as: NORVASC   Take 1 tablet (10 mg total) by mouth daily.      atenolol 50 MG tablet   Commonly known as: TENORMIN   Take 50 mg by mouth daily.      fluticasone 50 MCG/ACT nasal spray   Commonly known as: FLONASE   Place 2 sprays into the nose daily.      gabapentin 600 MG tablet   Commonly known as: NEURONTIN   Take 600 mg by mouth daily.      HYDROcodone-acetaminophen 5-500 MG per tablet   Commonly known as: VICODIN   Take 1 tablet by mouth 2 (two) times daily.      levothyroxine 88 MCG tablet   Commonly known as: SYNTHROID, LEVOTHROID   Take 88 mcg by mouth daily.      loperamide 2 MG capsule   Commonly known as: IMODIUM   Take 2 mg by mouth 4 (four) times daily as needed. For diarrhea.      magnesium oxide 400 MG tablet   Commonly known as: MAG-OX   Take 1 tablet (400 mg total) by mouth daily.      omeprazole 40 MG capsule   Commonly known as: PRILOSEC   Take 1 capsule (40 mg total) by mouth daily.       PRESERVISION AREDS 2 PO   Take 1 tablet by mouth daily.      rOPINIRole 1 MG tablet   Commonly known as: REQUIP   Take 1-2 mg by mouth 4 (four) times daily. 1mg  at 2AM; 2mg  at 8AM; 1mg  at [facility]; 2mg  at 8PM      sertraline 100 MG tablet   Commonly known as: ZOLOFT   Take 1 tablet (100 mg total) by mouth daily.      vancomycin 125 MG capsule   Commonly known as: VANCOCIN   Take 1 capsule (125 mg total) by mouth 4 (four) times daily.      vitamin B-12 1000 MCG tablet   Commonly known as: CYANOCOBALAMIN   Take 1,000 mcg by mouth daily.         Disposition and Follow-up:  Patient will be discharged back to her ALF today in stable and improved condition. Will be on PO vancomycin for 14 days for her c diff colitis.  Consults:  None   Significant Diagnostic Studies:  CXR 10/3:  IMPRESSION:  Low lung volumes with atelectasis.   Brief H and P: For complete details please refer to admission H and P, but in brief patient is a 78 yo female who was brought to ED for further evaluation of progressively worsening confusion with failure to thrive. She was seen in PCP office and was told she has UTI, and was given one shot of Rocephin. Please note that most of this history was obtained from family member as pt unable to provide detailed history. Pt has history of UTI and was recently hospitalized for it. She was discharged to rehabilitation facility for 3 weeks before returning to ALF. Per family member, pt is less confused today. No known concerns of fever, chills, abdominal or focal neurological deficits. We were asked to admit her for further evaluation and management.     Hospital Course:  Principal Problem:  *UTI (lower urinary tract infection) Active Problems:  Hypothyroidism  Depression with anxiety  HTN (hypertension)  Metabolic encephalopathy  CKD (chronic kidney disease)  Severe protein-calorie malnutrition  Sacral decubitus ulcer, stage III  Anemia of chronic  disease  Leukocytosis  C. difficile diarrhea    ?UTI -Cx returned negative. -Received 3 doses of Rocephin. -No further treatment at this time.  C Diff Colitis -Diarrhea improved. -Has a rectal tube in place as she has a sacral decub ulcer to prevent cross-contamination. -Will need 14 days of PO vanc.  ARF -Resolved.  Stage III Sacral Decub Ulcer -No signs of infection. -Wound consult recs as follows: enzymatic debridement ointment (Santyl) impregnated into 2x2 gauze, cover with foam dressing and change only packing daily. OK to use foam dressing up to 3 days before changing. On air mattress for pressure redistribution and chair pressure redistribution pad in room.   Acute Encephalopathy -Resolved. -Likely 2/2 ARF and c diff colitis.    Time spent on Discharge: Greater than 30 minutes.  SignedChaya Jan [facility] Pager: [phone] 09/17/2012, 2:09 PM

## 2012-09-18 ENCOUNTER — Telehealth: Payer: Self-pay | Admitting: *Deleted

## 2012-09-18 NOTE — Telephone Encounter (Signed)
ok 

## 2012-09-18 NOTE — Telephone Encounter (Signed)
[facility] Triage Call Report Triage Record Num: [id number] Operator: Tomasita Crumble Patient Name: [name] [name] Call Date & Time: 09/17/2012 7:47:40PM Patient Phone: [phone] PCP: Marguarite Arbour Patient Gender: Female PCP Fax : [phone] Patient DOB: [birth year] Practice Name: [facility] [place] Reason for Call: Caller: Wynona Canes Med Tech from [facility] ; PCP: Marguarite Arbour (Adults only); CB#: [phone]; Call regarding Need orders to reinsert rectal tubing; Relates she wants [facility] to come and reinsert her rectal tube; trying to avoid sending her back to hospital as she was just readmitted to facility this afternoon. Caller states she wants to do this to protect the decubitus from contact with her stool. Caller relates that patient is current with [facility]. Patient has no fever or pain; caller states, She doesn't even know it's out. Tube came out spontaneously; was to have stayed in. Per nursing judgment, contacted Dr. Darrick Huntsman on call provider. Order received: Reinsert rectal tube. Caller informed of same. Order faxed to facility. PCP Calls protocol used. Protocol(s) Used: PCP Calls, No Triage (Adult) Recommended Outcome per Protocol: Call Provider Immediately Reason for Outcome: [1] Caller requests to speak ONLY to PCP AND [2] urgent question

## 2012-09-19 ENCOUNTER — Telehealth: Payer: Self-pay | Admitting: Internal Medicine

## 2012-09-19 NOTE — Telephone Encounter (Signed)
Per verbal from Provider ok to d/c rectal tube. Left detailed message on Jennifer's voicemail.

## 2012-09-19 NOTE — Telephone Encounter (Signed)
[facility]  Is calling to obtain an order to D/C rectal tube.  Patient - [name] [name], DOB February 22, 1919, PCP- Dr.  Rodena Medin.  Patients last stool two days ago.  She is a resident at [facility].  Please contact for D/C order.  [phone].

## 2012-09-22 ENCOUNTER — Telehealth: Payer: Self-pay | Admitting: *Deleted

## 2012-09-22 MED ORDER — HYDROCODONE-ACETAMINOPHEN 5-500 MG PO TABS: 1.0000 | ORAL_TABLET | Freq: Two times a day (BID) | ORAL | Status: DC

## 2012-09-22 NOTE — Telephone Encounter (Signed)
Rec fax requesting Rx for Vicodin 1 po bid be faxed to [facility] for Rf. Please advise

## 2012-09-22 NOTE — Telephone Encounter (Signed)
Rx printed/signed/faxed to [phone]

## 2012-09-22 NOTE — Telephone Encounter (Signed)
Ok #[id number] 

## 2012-09-23 NOTE — Telephone Encounter (Signed)
Previous fax transmission failed. Spoke with [name] [name] at [facility] and received the corrected fax [phone]. Re-faxed to this number.

## 2012-09-30 ENCOUNTER — Encounter: Payer: Self-pay | Admitting: Internal Medicine

## 2012-09-30 ENCOUNTER — Ambulatory Visit (INDEPENDENT_AMBULATORY_CARE_PROVIDER_SITE_OTHER): Payer: Medicare Other | Admitting: Internal Medicine

## 2012-09-30 VITALS — BP 106/56 | HR 63 | Temp 97.7°F | Resp 14 | Wt 115.0 lb

## 2012-09-30 DIAGNOSIS — A0472 Enterocolitis due to Clostridium difficile, not specified as recurrent: Secondary | ICD-10-CM

## 2012-09-30 DIAGNOSIS — D649 Anemia, unspecified: Secondary | ICD-10-CM

## 2012-09-30 DIAGNOSIS — E871 Hypo-osmolality and hyponatremia: Secondary | ICD-10-CM

## 2012-09-30 LAB — CBC WITH DIFFERENTIAL/PLATELET
Basophils Absolute: 0.1 10*3/uL (ref 0.0–0.1)
Eosinophils Relative: 5 % (ref 0–5)
Lymphocytes Relative: 16 % (ref 12–46)
MCV: 93.4 fL (ref 78.0–100.0)
Neutro Abs: 5.7 10*3/uL (ref 1.7–7.7)
Platelets: 461 10*3/uL — ABNORMAL HIGH (ref 150–400)
RDW: 16 % — ABNORMAL HIGH (ref 11.5–15.5)
WBC: 8.2 10*3/uL (ref 4.0–10.5)

## 2012-09-30 LAB — BASIC METABOLIC PANEL
Chloride: 102 mEq/L (ref 96–112)
Creat: 0.86 mg/dL (ref 0.50–1.10)

## 2012-09-30 NOTE — Progress Notes (Signed)
  Subjective:    Patient ID: [name] [name], female    DOB: 11-10-1919, 78 y.o.   MRN: [id number]  HPI Pt presents to clinic for followup of multiple medical problems. Recently discharged from the hospital for treatment of UTI and C. differential colitis. Denies diarrhea or loose stools. Currently taking vancomycin by mouth to complete a two-week course. Mental status is at baseline. During hospitalization was noted to be hyponatremic. No evidence of volume depletion. Has skin care recommendations for decubitus ulcer not felt to be infected. Total time of visit approximately 20 minutes of which greater than 50% of the visit was spent in counseling.  Past Medical History  Diagnosis Date  . Arthritis   . History of chicken pox   . Depression     husband died 09-11-2011  . Glaucoma(365)   . Thyroid disease   . Hypertension   . Hypokalemia   . Renal disorder   . Altered mental state   . Blind   . Macular degeneration    Past Surgical History  Procedure Date  . Revision total knee arthroplasty     right knee  . Back surgery   . Cataract extraction, bilateral   . Thyroidectomy   . Abdominal hysterectomy     reports that she has never smoked. She has never used smokeless tobacco. She reports that she does not drink alcohol or use illicit drugs. family history includes Hypertension in an unspecified family member. Allergies  Allergen Reactions  . Codeine Other (See Comments)    insomnia  . Doxycycline Diarrhea and Other (See Comments)    Hallucinations  . Fentanyl Hives and Rash  . Lisinopril Nausea And Vomiting, Rash and Hypertension      Review of Systems see hpi     Objective:   Physical Exam  Nursing note and vitals reviewed. Constitutional: She appears well-developed and well-nourished. No distress.  HENT:  Head: Normocephalic and atraumatic.  Neurological: She is alert.  Skin: She is not diaphoretic.  Psychiatric: She has a normal mood and affect.            Assessment & Plan:

## 2012-10-01 ENCOUNTER — Telehealth: Payer: Self-pay | Admitting: *Deleted

## 2012-10-01 NOTE — Telephone Encounter (Signed)
Spoke with patient's son RE: results & went over this information with him/SLS

## 2012-10-01 NOTE — Telephone Encounter (Signed)
LMOM with contact name & number for daughter-in-law to inform of MD instructions, will send Orders to [facility] to correlate/SLS

## 2012-10-01 NOTE — Telephone Encounter (Signed)
Patient's daughter-in-law called to inquire about some things she states she "must have missed" before leaving office yesterday;  1) Imodium: she doesn't understand "how they would know if she's having diarrhea at [facility] if she is taking medication regularly and would like to know if this needs to be continued and/or D/C 2) Prilosec: she states that pt "has not had acid reflux in acid reflux in a long time" and would like to know if this medication needs to be continued and/or D/C 3) Neurontin 600 mg: She states that pt had been decreased to 300 mg daily and understands that when pt's are placed back into facilities that they "can get reverted back to previous instructions" but request that this be changed back to the Sig: 300 mg daily Please advise/SLS

## 2012-10-01 NOTE — Telephone Encounter (Signed)
Imodium should be prn. Ok to [facility]. neurontin 300mg  ok

## 2012-10-02 ENCOUNTER — Other Ambulatory Visit: Payer: Self-pay | Admitting: *Deleted

## 2012-10-02 MED ORDER — COLLAGENASE 250 UNIT/GM EX OINT: TOPICAL_OINTMENT | CUTANEOUS | Status: DC

## 2012-10-02 MED ORDER — GABAPENTIN 600 MG PO TABS: 300.0000 mg | ORAL_TABLET | Freq: Every day | ORAL | Status: DC

## 2012-10-02 NOTE — Progress Notes (Signed)
[name] [Luke] informed requested Rx faxed to pharmacy/SLS

## 2012-10-02 NOTE — Telephone Encounter (Signed)
Done/SLS 

## 2012-10-05 NOTE — Assessment & Plan Note (Signed)
Obtain Chem-7 

## 2012-10-05 NOTE — Assessment & Plan Note (Signed)
Asymptomatic. Continue vancomycin to fourteen day conclusion

## 2012-10-27 ENCOUNTER — Ambulatory Visit (INDEPENDENT_AMBULATORY_CARE_PROVIDER_SITE_OTHER): Payer: No Typology Code available for payment source | Admitting: Ophthalmology

## 2012-11-13 ENCOUNTER — Encounter: Payer: Self-pay | Admitting: Internal Medicine

## 2012-11-13 ENCOUNTER — Ambulatory Visit (INDEPENDENT_AMBULATORY_CARE_PROVIDER_SITE_OTHER): Payer: Medicare Other | Admitting: Internal Medicine

## 2012-11-13 ENCOUNTER — Other Ambulatory Visit: Payer: Self-pay | Admitting: *Deleted

## 2012-11-13 VITALS — BP 90/58 | HR 62 | Temp 97.9°F | Resp 18 | Wt 106.0 lb

## 2012-11-13 DIAGNOSIS — D649 Anemia, unspecified: Secondary | ICD-10-CM

## 2012-11-13 DIAGNOSIS — D638 Anemia in other chronic diseases classified elsewhere: Secondary | ICD-10-CM

## 2012-11-13 DIAGNOSIS — R634 Abnormal weight loss: Secondary | ICD-10-CM

## 2012-11-13 DIAGNOSIS — I10 Essential (primary) hypertension: Secondary | ICD-10-CM

## 2012-11-13 LAB — CBC WITH DIFFERENTIAL/PLATELET
Eosinophils Absolute: 0.2 10*3/uL (ref 0.0–0.7)
Hemoglobin: 10.3 g/dL — ABNORMAL LOW (ref 12.0–15.0)
Lymphocytes Relative: 21 % (ref 12–46)
Lymphs Abs: 1.4 10*3/uL (ref 0.7–4.0)
MCH: 33 pg (ref 26.0–34.0)
Monocytes Relative: 9 % (ref 3–12)
Neutro Abs: 4.3 10*3/uL (ref 1.7–7.7)
Neutrophils Relative %: 66 % (ref 43–77)
RBC: 3.12 MIL/uL — ABNORMAL LOW (ref 3.87–5.11)

## 2012-11-13 MED ORDER — AMLODIPINE BESYLATE 10 MG PO TABS: 5.0000 mg | ORAL_TABLET | Freq: Every day | ORAL | Status: DC

## 2012-11-13 MED ORDER — MEGESTROL ACETATE 400 MG/10ML PO SUSP: 800.0000 mg | Freq: Every day | ORAL | Status: DC

## 2012-11-13 MED ORDER — HYDROCODONE-ACETAMINOPHEN 5-500 MG PO TABS: 0.5000 | ORAL_TABLET | Freq: Two times a day (BID) | ORAL | Status: DC

## 2012-11-13 NOTE — Progress Notes (Signed)
Per Wynona Canes w/[facility], [facility] [phone] needs hard copy of Hydrocodone-APAP script to administer change in dosage [decrease to 0.5 tabs BID]; Rx fax to pharmacy/SLS

## 2012-11-15 DIAGNOSIS — R634 Abnormal weight loss: Secondary | ICD-10-CM | POA: Insufficient documentation

## 2012-11-15 NOTE — Progress Notes (Signed)
  Subjective:    Patient ID: [name] [name], female    DOB: [birth year], 78 y.o.   MRN: [id number]  HPI Pt presents to clinic for followup of multiple medical problems. Reviewed progressive weight loss with pt and family. Reviewed progressive anemia recently stable. BP low nl since weight loss. Denies joint pain currently and only stiffness.   Past Medical History  Diagnosis Date  . Arthritis   . History of chicken pox   . Depression     husband died 09/13/11  . Glaucoma(365)   . Thyroid disease   . Hypertension   . Hypokalemia   . Renal disorder   . Altered mental state   . Blind   . Macular degeneration    Past Surgical History  Procedure Date  . Revision total knee arthroplasty     right knee  . Back surgery   . Cataract extraction, bilateral   . Thyroidectomy   . Abdominal hysterectomy     reports that she has never smoked. She has never used smokeless tobacco. She reports that she does not drink alcohol or use illicit drugs. family history includes Hypertension in an unspecified family member. Allergies  Allergen Reactions  . Codeine Other (See Comments)    insomnia  . Doxycycline Diarrhea and Other (See Comments)    Hallucinations  . Fentanyl Hives and Rash  . Lisinopril Nausea And Vomiting, Rash and Hypertension      Review of Systems see hpi     Objective:   Physical Exam  Nursing note and vitals reviewed. Constitutional: She appears well-developed and well-nourished. No distress.  HENT:  Head: Normocephalic and atraumatic.  Right Ear: External ear normal.  Left Ear: External ear normal.  Neurological: She is alert.  Skin: She is not diaphoretic.  Psychiatric: She has a normal mood and affect.          Assessment & Plan:

## 2012-11-15 NOTE — Assessment & Plan Note (Signed)
Anemia evaluation. Obtain esr. Begin megace

## 2012-11-15 NOTE — Assessment & Plan Note (Signed)
Decrease norvasc dose to 5mg  qd. Monitor bp.

## 2012-11-15 NOTE — Assessment & Plan Note (Signed)
Obtain cbc and spep. If unrevealing proceed with hematology consult

## 2012-11-17 LAB — PROTEIN ELECTROPHORESIS, SERUM
Albumin ELP: 43.7 % — ABNORMAL LOW (ref 55.8–66.1)
Alpha-1-Globulin: 6.5 % — ABNORMAL HIGH (ref 2.9–4.9)
Alpha-2-Globulin: 10.7 % (ref 7.1–11.8)
Beta Globulin: 4.9 % (ref 4.7–7.2)
Total Protein, Serum Electrophoresis: 6.7 g/dL (ref 6.0–8.3)

## 2012-11-18 ENCOUNTER — Other Ambulatory Visit: Payer: Self-pay | Admitting: Internal Medicine

## 2012-11-18 DIAGNOSIS — D649 Anemia, unspecified: Secondary | ICD-10-CM

## 2012-11-20 ENCOUNTER — Telehealth: Payer: Self-pay | Admitting: *Deleted

## 2012-11-20 ENCOUNTER — Telehealth: Payer: Self-pay | Admitting: Hematology & Oncology

## 2012-11-20 NOTE — Telephone Encounter (Signed)
Received call from pt's daughter-in-law wanting to know if it would be ok to postpone pt's hematology consult until after the 1st of the year. Ok per verbal from Provider, notified Bev.

## 2012-11-20 NOTE — Telephone Encounter (Signed)
Pt aware of 12-23 appointment °

## 2012-11-21 ENCOUNTER — Telehealth: Payer: Self-pay | Admitting: Hematology & Oncology

## 2012-11-21 NOTE — Telephone Encounter (Signed)
Pt moved 12-23 to 12-12-12

## 2012-12-01 ENCOUNTER — Ambulatory Visit: Payer: Self-pay

## 2012-12-01 ENCOUNTER — Ambulatory Visit: Payer: Self-pay | Admitting: Hematology & Oncology

## 2012-12-01 ENCOUNTER — Other Ambulatory Visit: Payer: Self-pay | Admitting: Lab

## 2012-12-11 ENCOUNTER — Ambulatory Visit (INDEPENDENT_AMBULATORY_CARE_PROVIDER_SITE_OTHER): Payer: Medicare Other | Admitting: Ophthalmology

## 2012-12-11 DIAGNOSIS — H43819 Vitreous degeneration, unspecified eye: Secondary | ICD-10-CM

## 2012-12-11 DIAGNOSIS — I10 Essential (primary) hypertension: Secondary | ICD-10-CM

## 2012-12-11 DIAGNOSIS — H35039 Hypertensive retinopathy, unspecified eye: Secondary | ICD-10-CM

## 2012-12-11 DIAGNOSIS — H3530 Unspecified macular degeneration: Secondary | ICD-10-CM

## 2012-12-12 ENCOUNTER — Encounter: Payer: Self-pay | Admitting: Internal Medicine

## 2012-12-12 ENCOUNTER — Other Ambulatory Visit (HOSPITAL_BASED_OUTPATIENT_CLINIC_OR_DEPARTMENT_OTHER): Payer: MEDICARE | Admitting: Lab

## 2012-12-12 ENCOUNTER — Ambulatory Visit (HOSPITAL_BASED_OUTPATIENT_CLINIC_OR_DEPARTMENT_OTHER): Payer: MEDICARE | Admitting: Hematology & Oncology

## 2012-12-12 ENCOUNTER — Ambulatory Visit: Payer: Medicare Other

## 2012-12-12 VITALS — BP 133/61 | HR 68 | Temp 98.3°F | Resp 14 | Ht 61.0 in | Wt 111.0 lb

## 2012-12-12 DIAGNOSIS — D649 Anemia, unspecified: Secondary | ICD-10-CM

## 2012-12-12 DIAGNOSIS — D472 Monoclonal gammopathy: Secondary | ICD-10-CM

## 2012-12-12 LAB — CBC WITH DIFFERENTIAL (CANCER CENTER ONLY)
Eosinophils Absolute: 0.3 10*3/uL (ref 0.0–0.5)
HCT: 35.7 % (ref 34.8–46.6)
LYMPH#: 1.3 10*3/uL (ref 0.9–3.3)
LYMPH%: 19.3 % (ref 14.0–48.0)
MCV: 96 fL (ref 81–101)
MONO#: 0.5 10*3/uL (ref 0.1–0.9)
NEUT%: 68.2 % (ref 39.6–80.0)
RBC: 3.72 10*6/uL (ref 3.70–5.32)
WBC: 6.5 10*3/uL (ref 3.9–10.0)

## 2012-12-12 LAB — CHCC SATELLITE - SMEAR

## 2012-12-13 NOTE — Progress Notes (Signed)
This office note has been dictated.

## 2012-12-13 NOTE — Progress Notes (Signed)
CC:   [name] Arbour, MD  DIAGNOSIS:  Monoclonal gammopathy, likely MGUS (monoclonal gammopathy of undetermined significance).  HISTORY OF PRESENT ILLNESS:  [name] [name] is a very nice 79 year old white female.  She is originally from [place].  She lives at I think [facility].  She comes in in a wheelchair.  She has pretty bad arthritis.  [place]  Her son and daughter-in-law brought her in.  She is followed by Dr. Rodena Medin. She is on a few medications.  Dr. Rodena Medin did start her on some Megace.  This has helped her appetite quite a bit from what her son says.  Dr. Rodena Medin has noted that she has had problems with anemia.  Going through the chart, back in I think early October, her hemoglobin was 8.3.  Three days prior to this it was 10.8.  I do not know if she had any bleeding.  She did have an elevated white cell count.  She may have had an infection at the time.  She I think was admitted to the hospital back in August.  She had scans done of her head and cervical spine.  There were no bony lesions noted that were consistent with a plasma cell disorder.  She did have exaggerated cervical kyphosis.  She had stable degenerative changes. She had osteophyte formation.  She had pelvic x-rays done back in April.  These only showed some osteoporosis.  Again, no lytic lesions were noted.  Dr. Rodena Medin did an SPEP on her.  This was done in early December.  She had a monoclonal spike of 1.27 g/dL.  This was not further characterized.  She had a markedly elevated sedimentation rate I think of over 100.  She was referred to the [facility] because of these abnormalities.  Again, she is at a nursing home.  She seems to be doing okay over there.  She really cannot give a lot in the way of history.  She is quite frail. She is as sweet as can be, however.  She has not noted any obvious bleeding.  She has had no fever.  She  has had no obvious change in bowel or bladder habits.  She has had occasional leg swelling.  Again, once she started Megace, this helped her appetite quite a bit.  PAST MEDICAL HISTORY: 1. Hypothyroidism. 2. Hypertension. 3. Chronic renal insufficiency. 4. Clostridium difficile diarrhea. 5. Depression with anxiety.  ALLERGIES: 1. Codeine. 2. Doxycycline. 3. Fentanyl. 4. Lisinopril.  MEDICATIONS: 1. Norvasc 5 mg p.o. daily. 2. Tenormin 50 mg p.o. daily. 3. Flonase nasal spray 2 sprays daily. 4. Lasix 20 mg p.o. daily. 5. Neurontin 300 mg p.o. daily. 6. Synthroid 0.088 mg p.o. daily. 7. Megace elixir 20 cc daily. 8. Zoloft 100 mg p.o. daily. 9. Requip I think 1 mg p.o. I think t.i.d.  SOCIAL HISTORY:  Negative for tobacco or alcohol use.  She has no obvious occupational exposures.  FAMILY HISTORY:  Relatively noncontributory.  PHYSICAL EXAMINATION:  General:  This is a petite, elderly white female in no obvious distress.  She seems to be debilitated by arthritic issues.  Vital signs:  Show a temperature of 98.3, pulse 68, respiratory rate 18, blood pressure 133/61.  Weight is 111 pounds.  Head and neck: Shows a normocephalic, atraumatic skull.  There are no ocular or oral lesions.  There are no palpable cervical or supraclavicular lymph nodes. She has limited mobility of her  neck.  There is no scleral icterus. Thyroid is nonpalpable.  Lungs:  Clear bilaterally.  Cardiac:  Regular rate and rhythm with a normal S1, S2.  She has a 1/6 systolic ejection murmur.  Abdomen:  Soft with good bowel sounds.  She has no fluid wave. She has no guarding or rebound tenderness.  No palpable hepatosplenomegaly.  Back:  Shows kyphosis.  She has arthritic changes in her spine.  There is no obvious tenderness over the spine. Extremities:  Show osteoarthritic changes in her joints.  She has some slight swelling in her legs.  Neurological:  Shows no focal neurological deficit.  LABORATORY  STUDIES:  Show a white cell count of 6.5, hemoglobin 11.7, hematocrit 35.7, platelet count 268.  BUN 19, creatinine 0.89.  Calcium is 8.9 with an albumin of 3.5.  Ferritin is 97.  Iron saturation is 31%. IgG is 1950 mg/dL.  IgM is 469 mg/dL.  Peripheral smear shows a normochromic, normocytic population of red blood cells.  There are no target cells.  I see no rouleaux formation. She has no nucleated red blood cells.  There are no inclusion bodies. She has no polychromasia.  White cells appear normal in morphology and maturation.  There are no hypersegmented polys.  There are no immature myeloid or lymphoid forms.  I see no atypical lymphocytes.  Platelets are adequate in number and size.  IMPRESSION:  [name] [name] is a very charming 79 year old white female. Her performance status is ECOG 3 at best.  She has a monoclonal spike.  I do not have the rest of the lab work back.  I suspect, however, that she in all likelihood has an MGUS (monoclonal gammopathy of undetermined significance).  This MGUS could be based on her age.  An MGUS could be based on her underlying arthritis.  She had scans just done 3 or 4 months ago.  The scans do not show any lytic lesions in her bones.  She has no renal insufficiency.  She has no hypercalcemia.  I could not imagine that she has an underlying myeloma.  She clearly does not need to be treated.  She does not need to have a bone marrow biopsy done.  The anemia that she had resolved nicely.  This may have been a nutritional type of anemia.  Her blood smear certainly looks unremarkable.  I just do not see that we need to intervene with [name] [name].  She has a quite poor performance status.  I think that supportive care should be the goal.  I had a very nice time with her.  Her son and daughter-in-law came with her and they are very, very nice.  I did give [name] [name] a prayer blanket.  I also gave her some dark chocolate which she likes  quite a bit.  I just do not see that we need to get [name] [name] back to the office. It is tough for her to come see [place].  Her blood counts look good.  Again, I have to believe that she has an MGUS.  I suppose that she may have smoldering myeloma but again, I do not see that any intervention is necessary as from my point of view she is asymptomatic.  I spent a good hour or so with her today.  Addendum:  I failed to mention that she is on the Megace.  I do not see any problem with her staying on Megace.  This is helping her appetite. The increased appetite is improving  her quality of life and again I think this is what the goal should be.    ______________________________ Josph Macho, M.D. PRE/MEDQ  D:  12/13/2012  T:  12/13/2012  Job:  [id number]

## 2012-12-16 LAB — IRON AND TIBC
%SAT: 31 % (ref 20–55)
Iron: 102 ug/dL (ref 42–145)
TIBC: 334 ug/dL (ref 250–470)
UIBC: 232 ug/dL (ref 125–400)

## 2012-12-16 LAB — KAPPA/LAMBDA LIGHT CHAINS
Kappa free light chain: 5.11 mg/dL — ABNORMAL HIGH (ref 0.33–1.94)
Kappa:Lambda Ratio: 1.46 (ref 0.26–1.65)

## 2012-12-16 LAB — PROTEIN ELECTROPHORESIS, SERUM, WITH REFLEX
Beta Globulin: 6.3 % (ref 4.7–7.2)
Gamma Globulin: 26 % — ABNORMAL HIGH (ref 11.1–18.8)
M-Spike, %: 1.2 g/dL
Total Protein, Serum Electrophoresis: 7.2 g/dL (ref 6.0–8.3)

## 2012-12-16 LAB — COMPREHENSIVE METABOLIC PANEL
AST: 31 U/L (ref 0–37)
Albumin: 3.5 g/dL (ref 3.5–5.2)
Alkaline Phosphatase: 331 U/L — ABNORMAL HIGH (ref 39–117)
BUN: 19 mg/dL (ref 6–23)
Glucose, Bld: 85 mg/dL (ref 70–99)
Potassium: 4.4 mEq/L (ref 3.5–5.3)
Total Bilirubin: 0.6 mg/dL (ref 0.3–1.2)

## 2012-12-16 LAB — IGG, IGA, IGM
IgA: 363 mg/dL (ref 69–380)
IgM, Serum: 469 mg/dL — ABNORMAL HIGH (ref 52–322)

## 2012-12-16 LAB — IFE INTERPRETATION

## 2012-12-18 ENCOUNTER — Ambulatory Visit: Payer: Self-pay | Admitting: Internal Medicine

## 2012-12-26 ENCOUNTER — Encounter: Payer: Self-pay | Admitting: Internal Medicine

## 2012-12-26 ENCOUNTER — Ambulatory Visit (INDEPENDENT_AMBULATORY_CARE_PROVIDER_SITE_OTHER): Payer: Medicare Other | Admitting: Internal Medicine

## 2012-12-26 VITALS — BP 143/71 | HR 62 | Temp 98.2°F | Resp 14 | Wt 113.0 lb

## 2012-12-26 DIAGNOSIS — S81802A Unspecified open wound, left lower leg, initial encounter: Secondary | ICD-10-CM

## 2012-12-26 DIAGNOSIS — R634 Abnormal weight loss: Secondary | ICD-10-CM

## 2012-12-26 DIAGNOSIS — S81809A Unspecified open wound, unspecified lower leg, initial encounter: Secondary | ICD-10-CM

## 2012-12-26 DIAGNOSIS — M2550 Pain in unspecified joint: Secondary | ICD-10-CM

## 2012-12-26 DIAGNOSIS — R700 Elevated erythrocyte sedimentation rate: Secondary | ICD-10-CM

## 2012-12-27 DIAGNOSIS — S81802A Unspecified open wound, left lower leg, initial encounter: Secondary | ICD-10-CM | POA: Insufficient documentation

## 2012-12-27 DIAGNOSIS — R700 Elevated erythrocyte sedimentation rate: Secondary | ICD-10-CM | POA: Insufficient documentation

## 2012-12-27 NOTE — Progress Notes (Signed)
  Subjective:    Patient ID: [name] [name], female    DOB: 1919/03/02, 79 y.o.   MRN: [id number]  HPI Pt presents to clinic for followup of multiple medical problems. Returns today with improve wt with megace. Now s/p hematology evaluation with anemia and abn spep thought to have mgus. Did review significantly elevated esr >100. Had recent fall at nursing facility with superficial injury to LLE. HH nursing is assisting with wound care.  Past Medical History  Diagnosis Date  . Arthritis   . History of chicken pox   . Depression     husband died 09/09/11  . Glaucoma(365)   . Thyroid disease   . Hypertension   . Hypokalemia   . Renal disorder   . Altered mental state   . Blind   . Macular degeneration    Past Surgical History  Procedure Date  . Revision total knee arthroplasty     right knee  . Back surgery   . Cataract extraction, bilateral   . Thyroidectomy   . Abdominal hysterectomy     reports that she has never smoked. She has never used smokeless tobacco. She reports that she does not drink alcohol or use illicit drugs. family history includes Hypertension in an unspecified family member. Allergies  Allergen Reactions  . Codeine Other (See Comments)    insomnia  . Doxycycline Diarrhea and Other (See Comments)    Hallucinations  . Fentanyl Hives and Rash  . Lisinopril Nausea And Vomiting, Rash and Hypertension      Review of Systems see hpi     Objective:   Physical Exam  Nursing note and vitals reviewed. Constitutional: She appears well-developed and well-nourished. No distress.  HENT:  Head: Normocephalic and atraumatic.  Right Ear: External ear normal.  Left Ear: External ear normal.  Skin: Skin is warm. She is not diaphoretic.       Left ant lower leg- superfical wound ~4-5cm. Serosanguinous drainage without purulence noted. No warmth.          Assessment & Plan:

## 2012-12-27 NOTE — Assessment & Plan Note (Signed)
Improving with megace

## 2012-12-27 NOTE — Assessment & Plan Note (Signed)
Discussed differential dx of significantly elevated esr. Obtain ana, rheumatoid factor and uric acid. Did discuss possibility of malignancy and both myself and family agree that an aggressive evaluation at this point would be counterproductive. Agree to focus on comfort.

## 2012-12-27 NOTE — Assessment & Plan Note (Signed)
No current evidence for infection. Continue wound care and observation

## 2012-12-29 LAB — ANA: Anti Nuclear Antibody(ANA): NEGATIVE

## 2013-01-02 ENCOUNTER — Telehealth: Payer: Self-pay | Admitting: *Deleted

## 2013-01-02 ENCOUNTER — Encounter: Payer: Self-pay | Admitting: Family Medicine

## 2013-01-02 ENCOUNTER — Ambulatory Visit (INDEPENDENT_AMBULATORY_CARE_PROVIDER_SITE_OTHER): Payer: Medicare Other | Admitting: Family Medicine

## 2013-01-02 VITALS — BP 160/82 | Temp 98.0°F | Wt 114.0 lb

## 2013-01-02 DIAGNOSIS — I10 Essential (primary) hypertension: Secondary | ICD-10-CM

## 2013-01-02 DIAGNOSIS — K219 Gastro-esophageal reflux disease without esophagitis: Secondary | ICD-10-CM

## 2013-01-02 DIAGNOSIS — B029 Zoster without complications: Secondary | ICD-10-CM | POA: Insufficient documentation

## 2013-01-02 MED ORDER — VALACYCLOVIR HCL 1 G PO TABS: 1000.0000 mg | ORAL_TABLET | Freq: Three times a day (TID) | ORAL | Status: DC

## 2013-01-02 MED ORDER — TRIAMCINOLONE ACETONIDE 0.1 % EX OINT: TOPICAL_OINTMENT | Freq: Two times a day (BID) | CUTANEOUS | Status: DC

## 2013-01-02 MED ORDER — OMEPRAZOLE 20 MG PO CPDR: 20.0000 mg | DELAYED_RELEASE_CAPSULE | Freq: Every day | ORAL | Status: DC

## 2013-01-02 NOTE — Telephone Encounter (Signed)
Spoke with nurse at [facility]. She states pt started having right side pain around the rib area yesterday afternoon. Appears to have a slightly red rash in the same area. She states she will assess pt now and call me back with further information. States that her regular pain medication does not seem to be helping. Pt was seen in the [place] office today.

## 2013-01-02 NOTE — Progress Notes (Signed)
  Subjective:    Patient ID: [name] [name], female    DOB: [birth year], 79 y.o.   MRN: [id number]  HPI Rash- 'very bad pain'.  2 days ago had pain from shoulder to hip.  Reports she has a mark on breast.  Family was told by ALF that she has a rash, possibly early shingles.  No hx of shingles.  Acid- 'please give me something for stomach acid'.  Reports that she will have frequent heartburn, particularly w/ taking medicines.  Not currently listed on problem list and not taking any meds for this.  HTN- 'it's never been such a number'.  Denies CP, SOB, HAs, edema.  In a lot of pain from her 'rash'.   Review of Systems For ROS see HPI     Objective:   Physical Exam  Vitals reviewed. Constitutional: She appears well-developed and well-nourished. No distress.  Cardiovascular: Normal rate, regular rhythm, normal heart sounds and intact distal pulses.   Pulmonary/Chest: Effort normal and breath sounds normal. No respiratory distress. She has no wheezes. She has no rales.  Skin: Skin is warm and dry. Rash (vesicular dermatomal rash on L flank) noted.          Assessment & Plan:

## 2013-01-02 NOTE — Patient Instructions (Addendum)
This is shingles Start the Valtrex- 3x/day Use the steroid cream to help w/ discomfort Do not wear your bra Call with any questions or concerns Hang in there!

## 2013-01-04 DIAGNOSIS — K219 Gastro-esophageal reflux disease without esophagitis: Secondary | ICD-10-CM | POA: Insufficient documentation

## 2013-01-04 NOTE — Assessment & Plan Note (Signed)
Deteriorated.  Suspect this is due to current level of pain.  No med changes.  Will follow at future visits.

## 2013-01-04 NOTE — Assessment & Plan Note (Signed)
New.  Start low dose PPI due to pt's complaint of current sxs.

## 2013-01-04 NOTE — Assessment & Plan Note (Signed)
New.  Start valtrex.  Steroid cream for itching.  Reviewed supportive care and red flags that should prompt return.  Pt expressed understanding and is in agreement w/ plan.

## 2013-01-08 ENCOUNTER — Telehealth: Payer: Self-pay | Admitting: Family Medicine

## 2013-01-08 NOTE — Telephone Encounter (Signed)
Patient Information:  Caller Name: Bev  Phone: [phone]  Patient: Jacilyn, [place]  Gender: Female  DOB: Dec 09, 1919  Age: 79 Years  PCP: Sheliah Hatch.  Office Follow Up:  Does the office need to follow up with this patient?: Yes  Instructions For The Office: OFFICE CAN ADDITIONAL MEDICATION BE CALLED IN FOR PT OR DOES PT NEED TO COME BACK IN FOR AN OFFICE VISIT?  PLEASE FOLLOW UP   Symptoms  Reason For Call & Symptoms: shingles... pt was seen on 01/02/13.  Caller states that rash is still present and it is still very painful.  Reviewed Health History In EMR: Yes  Reviewed Medications In EMR: Yes  Reviewed Allergies In EMR: Yes  Reviewed Surgeries / Procedures: Yes  Date of Onset of Symptoms: 01/02/2013  Treatments Tried: valtrex, steroid cream  Treatments Tried Worked: No  Guideline(s) Used:  No Protocol Available - Sick Adult  Disposition Per Guideline:   Discuss with PCP and Callback by Nurse Today  Reason For Disposition Reached:   Nursing judgment  Advice Given:  N/A

## 2013-01-08 NOTE — Telephone Encounter (Signed)
Please advise on note from caller nurse.//AB/CMA 

## 2013-01-08 NOTE — Telephone Encounter (Signed)
No need for additional Valtrex- it will not do any good.  Rash needs to run its course (it's a virus).  Should take the hydrocodone that she has available- she can have a refill if needed.  Please also correct that I am NOT her PCP.  Saw her as an acute only since there were no available appts at [facility]

## 2013-01-09 ENCOUNTER — Telehealth: Payer: Self-pay | Admitting: Internal Medicine

## 2013-01-09 MED ORDER — HYDROCODONE-ACETAMINOPHEN 5-325 MG PO TABS: 0.5000 | ORAL_TABLET | Freq: Two times a day (BID) | ORAL | Status: DC | PRN

## 2013-01-09 MED ORDER — HYDROCODONE-ACETAMINOPHEN 5-325 MG PO TABS: 0.5000 | ORAL_TABLET | Freq: Two times a day (BID) | ORAL | Status: DC

## 2013-01-09 NOTE — Telephone Encounter (Signed)
Pts son states that he doesn't believe his mom had any issues with the hydrocodone..  Please send to [facility] on [address]

## 2013-01-09 NOTE — Telephone Encounter (Signed)
So I see in her chart she has had Hydrocodone in past, did she tolerate that OK if so and she finds it helpful I can call some in for her

## 2013-01-09 NOTE — Telephone Encounter (Signed)
Patients daughter in law, Bev called stating that patient was seen last week at our [facility] location and was diagnosed with shingles. Bev states that patient took last valtrex pill today and is also still taking the cream, but patient is still complaining of pain. Bev would like to know if the doctor could prescribe anything for pain?

## 2013-01-09 NOTE — Telephone Encounter (Signed)
Spoke with the pt's son and informed him that Dr. Beverely Low stated that the pt did not need an refill on the Valtrex because it will not do any good.  The rash needs to run its course (it's a virus).  Told him she has the Hydrocodone and she can take that, and if she needs a refill on the med we can refill it.  Informed the son if the pt's continues to have problems she should see her PCP.  The son understood and agreed and asked if we would please refill the Hydrocodone.  Rx refilled and  sent to the pharmacy by fax.//AB/CMA

## 2013-01-09 NOTE — Telephone Encounter (Signed)
Please advise 

## 2013-01-09 NOTE — Telephone Encounter (Signed)
OK then let's refill Norco 5/325, 1/2 tab po bid prn pain with food. Disp # 30, 1 rf

## 2013-01-12 ENCOUNTER — Other Ambulatory Visit: Payer: Self-pay

## 2013-01-12 MED ORDER — HYDROCODONE-ACETAMINOPHEN 5-325 MG PO TABS: 0.5000 | ORAL_TABLET | Freq: Two times a day (BID) | ORAL | Status: DC | PRN

## 2013-01-12 MED ORDER — HYDROCODONE-ACETAMINOPHEN 5-325 MG PO TABS: 1.0000 | ORAL_TABLET | Freq: Every day | ORAL | Status: DC | PRN

## 2013-01-12 NOTE — Telephone Encounter (Signed)
[facility] sent a fax stating that the pharmacy [facility] fax# [phone]) is requesting a hardscript for Hydrocodone5-325 1/2 tab bid. They are also asking for a PRN order for Hydrocodone 5 mg/325 mg 1 whole tab? Please advise?

## 2013-01-12 NOTE — Telephone Encounter (Signed)
OK to write the Norco as requested 1/2 tab po bid prn, disp #30, 2 rf. OK to write a second prescription Norco 5/325 tab 1 tab po daily prn severe pain #30, 2 rf

## 2013-01-19 ENCOUNTER — Ambulatory Visit (INDEPENDENT_AMBULATORY_CARE_PROVIDER_SITE_OTHER): Payer: Medicare Other | Admitting: Family Medicine

## 2013-01-19 ENCOUNTER — Encounter: Payer: Self-pay | Admitting: Family Medicine

## 2013-01-19 VITALS — BP 110/58 | HR 60 | Temp 97.9°F

## 2013-01-19 DIAGNOSIS — R1013 Epigastric pain: Secondary | ICD-10-CM | POA: Insufficient documentation

## 2013-01-19 NOTE — Patient Instructions (Addendum)
We'll notify you of your lab results and make any changes if needed Continue the Omeprazole daily for possible reflux Start prunes daily! Our goal is a good BM Try and reposition in the wheel chair to take pressure off the back STOP the hydrocodone as soon as possible- this is slowing her bowels even more Call or go to the ER if her symptoms change or worsen Hang in there!!!

## 2013-01-19 NOTE — Progress Notes (Signed)
  Subjective:    Patient ID: [name] [name], female    DOB: 10-Nov-1919, 79 y.o.   MRN: [id number]  HPI abd pain- sxs started 3 days ago, described as 'sharp pain' in the 'belly button area'.  Pain is worse w/ movement.  Pain improves w/ rest.  No nausea or vomiting.  Recently started on Omeprazole for epigastric pain.  Pain is short lived, intermittent.  No relation to food.  + constipation but this is not new.  Pt is difficult historian and family not able to contribute much b/c pt lives in nursing home.   Review of Systems For ROS see HPI     Objective:   Physical Exam  Vitals reviewed. Constitutional: No distress.  Frail, elderly woman slumped in wheel chair  Cardiovascular: Normal rate and regular rhythm.   Pulmonary/Chest: Effort normal and breath sounds normal. No respiratory distress. She has no wheezes. She has no rales.  Abdominal: Soft. Bowel sounds are normal. She exhibits distension (mild). There is tenderness (mild TTP over epigastrum). There is no rebound and no guarding.  Musculoskeletal:  Slumped to the R in her wheelchair  Skin: Skin is warm and dry. Rash (well healing shingles rash) noted.          Assessment & Plan:

## 2013-01-20 ENCOUNTER — Ambulatory Visit: Payer: Self-pay | Admitting: Family

## 2013-01-20 LAB — CBC WITH DIFFERENTIAL/PLATELET
Basophils Absolute: 0 10*3/uL (ref 0.0–0.1)
Eosinophils Absolute: 0.2 10*3/uL (ref 0.0–0.7)
Eosinophils Relative: 2.5 % (ref 0.0–5.0)
MCV: 91.7 fl (ref 78.0–100.0)
Monocytes Absolute: 0.6 10*3/uL (ref 0.1–1.0)
Neutrophils Relative %: 70.1 % (ref 43.0–77.0)
Platelets: 184 10*3/uL (ref 150.0–400.0)
RDW: 14.5 % (ref 11.5–14.6)
WBC: 6.4 10*3/uL (ref 4.5–10.5)

## 2013-01-20 LAB — BASIC METABOLIC PANEL
Calcium: 8.8 mg/dL (ref 8.4–10.5)
GFR: 53.97 mL/min — ABNORMAL LOW (ref >60.00)
Glucose, Bld: 102 mg/dL — ABNORMAL HIGH (ref 70–99)
Potassium: 3.9 mEq/L (ref 3.5–5.1)
Sodium: 138 mEq/L (ref 135–145)

## 2013-01-20 LAB — HEPATIC FUNCTION PANEL
AST: 27 U/L (ref 0–37)
Alkaline Phosphatase: 253 U/L — ABNORMAL HIGH (ref 39–117)
Bilirubin, Direct: 0.2 mg/dL (ref 0.0–0.3)
Total Bilirubin: 0.5 mg/dL (ref 0.3–1.2)

## 2013-01-20 LAB — LIPASE: Lipase: 29 U/L (ref 11.0–59.0)

## 2013-01-25 NOTE — Assessment & Plan Note (Addendum)
New.  Pt's family called last week asking for Omeprazole for possible GERD.  Pt continues to have sharp, fleeting epigastric pain that doesn't appear to have relation to eating.  Check labs to r/o H pylori, biliary abnormality, infxn.  Discussed possibility of pain coming from shingles, constipation, poor positioning in wheel chair- pt slumps to R.  Encouraged family to stop narcotics as this is worsening constipation.  Will determine next steps based on lab results.  Family expressed agreement and understaning.

## 2013-01-26 ENCOUNTER — Telehealth: Payer: Self-pay | Admitting: *Deleted

## 2013-01-26 ENCOUNTER — Telehealth: Payer: Self-pay | Admitting: Internal Medicine

## 2013-01-26 ENCOUNTER — Telehealth: Payer: Self-pay

## 2013-01-26 DIAGNOSIS — R748 Abnormal levels of other serum enzymes: Secondary | ICD-10-CM

## 2013-01-26 NOTE — Telephone Encounter (Signed)
Received call from Bev (daughter in law) requesting status of request and appt to be seen due to the "knots" on pt's abdomen. Advised her xray and dulcolax request have been forwarded to Provider for review and recommendation; will call her once response is received. She requests that we leave a detailed message on [phone] if we get voicemail.  Please advise [id number] below and fax.

## 2013-01-26 NOTE — Telephone Encounter (Signed)
Please advise re: below request.

## 2013-01-26 NOTE — Telephone Encounter (Signed)
would like results of labs for this pt from 2.10.14 if available  LM on triage line 12:39pm

## 2013-01-26 NOTE — Telephone Encounter (Signed)
I am not aware of the KUB results in the office yet. If no BM in 2 days, have them try 4 oz of warm prune juice mixed with 2 tbls of Milk of Magnesium and may repeat in 4 hours once if no results. Call if no results for further instructions

## 2013-01-26 NOTE — Telephone Encounter (Signed)
Called [facility] and spoke with [name], Charity fundraiser. She states she will pull xray  [facility] Triage Call Report Triage Record Num: [id number] Operator: Hillary Bow Patient Name: [name] [name] Call Date & Time: 01/25/2013 1:18:41PM Patient Phone: [phone] PCP: Marguarite Arbour Patient Gender: Female PCP Fax : [phone] Patient DOB: 06-21-1919 Practice Name: [facility] [place] [facility] Reason for Call: Caller: Christine/Med Tech;[facility], PCP: Marguarite Arbour (Adults only); CB#: [phone]; Call regarding Abd distended w/ pain and 2 soft lumps noticed at [facility]. Last bowel movement was 2-15. KUB ordere requested. Afebrile. All emergent symptoms ruled out per Abdominal distention protocol, see in 72 hrs due to new lumps or swelling in abdomen. Consulted w/ Dr Para March, verbal order for KUB, results will be called into office. Med Tech verbalized understanding. Protocol(s) Used: Abdominal Distention Recommended Outcome per Protocol: See Provider within 72 Hours Reason for Outcome: New or increasing mass, lump or localized swelling in abdomen Care Advice:

## 2013-01-26 NOTE — Telephone Encounter (Signed)
Spoke with the pt's son Franky Macho) and informed him of the pt's recent lab results and note.  Franky Macho understood and agreed, and he will call back on Tues.(01-27-13) and schedule an lab appt for the pt.  Future labs have been put in.//AB/CMA

## 2013-01-26 NOTE — Telephone Encounter (Signed)
Patients daughter (Bev) left a message stating she would like an order sent to [facility] for dulcolax prn. Pts daughter stated in the message that her mom has been constipated off and on for over a week? Please advise?

## 2013-01-26 NOTE — Telephone Encounter (Signed)
Message copied by Verdie Shire on Mon Jan 26, 2013  5:21 PM ------      Message from: Sheliah Hatch      Created: Sat Jan 24, 2013  1:41 PM       Labs look good w/ exception of elevated alk phos- this has been elevated for 5 months and is continuing to improve.  (down from 502 --> 253 in 5 months).  She has not had a work up done for this- need to return for lab visit (GGT, dx elevated alk phos) ------

## 2013-01-26 NOTE — Telephone Encounter (Signed)
Start Docusate 100 mg cap daily and for acute constipation seen on KUB give 4 oz warm prune juice with 2 tablespoons of milk of magnesia by mouth and a Dulcolax suppository by rectum simultaneously. If no results repeat in 4 hours. If no results call for further instructions.

## 2013-01-26 NOTE — Telephone Encounter (Signed)
See additional phone note dated 01/26/13.

## 2013-01-26 NOTE — Telephone Encounter (Signed)
Do not see that we have received KUB result. Called [facility] and spoke with [name] to see if further instructions were needed. She states she will pull the xray and fax it to [place]. Xray result received. [name] reports that pt continues drinking prune juice 2-3 times daily. Still thinks abdomen remains distended and pt continues to hurt around navel area. States that bowel movements are not regular. Hydrocodone has been stopped on a regular basis but [facility] states that pt still receives it on a limited basis if pt/family requests it due to severe pain. MAR is not available but she will verify last dose of hydrocodone. States that pt is not receiving anything other than prune juice at this time and wants to know if there are any further recommendations? Please advise.  [facility]  Triage Call Report  Triage Record Num: [id number] Operator: Hillary Bow  Patient Name: [name] [name] Call Date & Time: 01/25/2013 1:18:41PM  Patient Phone: [phone] PCP: Marguarite Arbour  Patient Gender: Female PCP Fax : [phone]  Patient DOB: [birth year] Practice Name: [facility] [place]  Reason for Call:  Caller: Christine/Med Tech;[facility], PCP: Marguarite Arbour (Adults only); CB#:  [phone]; Call regarding Abd distended w/ pain and 2 soft lumps noticed at [facility]. Last  bowel movement was 2-15. KUB ordere requested. Afebrile. All emergent symptoms ruled  out per Abdominal distention protocol, see in 72 hrs due to new lumps or swelling in  abdomen. Consulted w/ Dr Para March, verbal order for KUB, results will be called into office.  Med Tech verbalized understanding.  Protocol(s) Used: Abdominal Distention  Recommended Outcome per Protocol: See Provider within 72 Hours  Reason for Outcome: New or increasing mass, lump or localized  swelling in abdomen

## 2013-01-26 NOTE — Telephone Encounter (Signed)
Notified Music therapist at [facility]. Orders faxed to [phone]. Per verbal from Provider, if there is no resolution of raised areas around navel after bowels respond to treatment below then pt should be evaluated in the office as she feels these may be due to excess stool in the colon. [name] states that they offered pt milk of magnesia earlier and pt refused. Notified pt's son and asked assistance in explaining to pt need to complete instructions below before appt or further testing can be done. He voices understanding.

## 2013-01-28 ENCOUNTER — Other Ambulatory Visit (INDEPENDENT_AMBULATORY_CARE_PROVIDER_SITE_OTHER): Payer: Medicare Other

## 2013-01-28 ENCOUNTER — Telehealth: Payer: Self-pay | Admitting: *Deleted

## 2013-01-28 LAB — GAMMA GT: GGT: 637 U/L — ABNORMAL HIGH (ref 7–51)

## 2013-01-28 NOTE — Telephone Encounter (Signed)
Message copied by Verdie Shire on Wed Jan 28, 2013  5:13 PM ------      Message from: Sheliah Hatch      Created: Wed Jan 28, 2013  4:32 PM       Pt's GGT is markedly elevated.  This correlates w/ her abnormal alk phos.  Based on this, pt needs a RUQ [place] to assess her liver- especially in the setting of abdominal pain. ------

## 2013-01-28 NOTE — Telephone Encounter (Signed)
LM  @ (5:15pm) asking the pt's son(Luke) to RTC.//AB/CMA

## 2013-01-29 NOTE — Telephone Encounter (Signed)
VM left stating that she has been calling since Monday about her mom test result and has yet to get a call back. Return call,Left message to call office. Call was to inform Bridgett Collins due to HIPPAA we are unable to disclose any of Pt health info to her without updated DPR from Pt giving [place] permission to speak with her. Per Angie she has been speaking with Pt son who is on her DPR.

## 2013-01-30 ENCOUNTER — Other Ambulatory Visit (HOSPITAL_BASED_OUTPATIENT_CLINIC_OR_DEPARTMENT_OTHER): Payer: Self-pay

## 2013-01-30 ENCOUNTER — Telehealth: Payer: Self-pay | Admitting: *Deleted

## 2013-01-30 ENCOUNTER — Ambulatory Visit (HOSPITAL_BASED_OUTPATIENT_CLINIC_OR_DEPARTMENT_OTHER)
Admission: RE | Admit: 2013-01-30 | Discharge: 2013-01-30 | Disposition: A | Discharge status: Home / Self Care | Payer: Medicare Other | Source: Ambulatory Visit | Attending: Family Medicine | Admitting: Family Medicine

## 2013-01-30 DIAGNOSIS — R1011 Right upper quadrant pain: Secondary | ICD-10-CM | POA: Insufficient documentation

## 2013-01-30 DIAGNOSIS — R748 Abnormal levels of other serum enzymes: Secondary | ICD-10-CM | POA: Insufficient documentation

## 2013-01-30 NOTE — Telephone Encounter (Signed)
Message copied by Verdie Shire on Fri Jan 30, 2013 11:54 AM ------      Message from: Sheliah Hatch      Created: Wed Jan 28, 2013  4:32 PM       Pt's GGT is markedly elevated.  This correlates w/ her abnormal alk phos.  Based on this, pt needs a RUQ [place] to assess her liver- especially in the setting of abdominal pain. ------

## 2013-01-30 NOTE — Telephone Encounter (Signed)
Informed pt's son(Luke) of the pt's recent lab results and note.  He understood and agreed.  [place] was set to be ordered.//AB/CMA

## 2013-01-31 ENCOUNTER — Encounter (HOSPITAL_COMMUNITY): Payer: Self-pay | Admitting: Emergency Medicine

## 2013-01-31 ENCOUNTER — Emergency Department (HOSPITAL_COMMUNITY)
Admission: EM | Admit: 2013-01-31 | Discharge: 2013-01-31 | Disposition: A | Discharge status: Home / Self Care | Payer: Medicare Other | Attending: Emergency Medicine | Admitting: Emergency Medicine

## 2013-01-31 DIAGNOSIS — Z87828 Personal history of other (healed) physical injury and trauma: Secondary | ICD-10-CM | POA: Insufficient documentation

## 2013-01-31 DIAGNOSIS — Z8619 Personal history of other infectious and parasitic diseases: Secondary | ICD-10-CM | POA: Insufficient documentation

## 2013-01-31 DIAGNOSIS — S59909A Unspecified injury of unspecified elbow, initial encounter: Secondary | ICD-10-CM | POA: Insufficient documentation

## 2013-01-31 DIAGNOSIS — Z8739 Personal history of other diseases of the musculoskeletal system and connective tissue: Secondary | ICD-10-CM | POA: Insufficient documentation

## 2013-01-31 DIAGNOSIS — H543 Unqualified visual loss, both eyes: Secondary | ICD-10-CM | POA: Insufficient documentation

## 2013-01-31 DIAGNOSIS — Z79899 Other long term (current) drug therapy: Secondary | ICD-10-CM | POA: Insufficient documentation

## 2013-01-31 DIAGNOSIS — R5381 Other malaise: Secondary | ICD-10-CM | POA: Insufficient documentation

## 2013-01-31 DIAGNOSIS — Z8639 Personal history of other endocrine, nutritional and metabolic disease: Secondary | ICD-10-CM | POA: Insufficient documentation

## 2013-01-31 DIAGNOSIS — Y92009 Unspecified place in unspecified non-institutional (private) residence as the place of occurrence of the external cause: Secondary | ICD-10-CM | POA: Insufficient documentation

## 2013-01-31 DIAGNOSIS — F329 Major depressive disorder, single episode, unspecified: Secondary | ICD-10-CM | POA: Insufficient documentation

## 2013-01-31 DIAGNOSIS — Y9389 Activity, other specified: Secondary | ICD-10-CM | POA: Insufficient documentation

## 2013-01-31 DIAGNOSIS — Z862 Personal history of diseases of the blood and blood-forming organs and certain disorders involving the immune mechanism: Secondary | ICD-10-CM | POA: Insufficient documentation

## 2013-01-31 DIAGNOSIS — R296 Repeated falls: Secondary | ICD-10-CM | POA: Insufficient documentation

## 2013-01-31 DIAGNOSIS — Z8669 Personal history of other diseases of the nervous system and sense organs: Secondary | ICD-10-CM | POA: Insufficient documentation

## 2013-01-31 DIAGNOSIS — S6990XA Unspecified injury of unspecified wrist, hand and finger(s), initial encounter: Secondary | ICD-10-CM | POA: Insufficient documentation

## 2013-01-31 DIAGNOSIS — E079 Disorder of thyroid, unspecified: Secondary | ICD-10-CM | POA: Insufficient documentation

## 2013-01-31 DIAGNOSIS — IMO0002 Reserved for concepts with insufficient information to code with codable children: Secondary | ICD-10-CM | POA: Insufficient documentation

## 2013-01-31 DIAGNOSIS — I10 Essential (primary) hypertension: Secondary | ICD-10-CM | POA: Insufficient documentation

## 2013-01-31 DIAGNOSIS — F3289 Other specified depressive episodes: Secondary | ICD-10-CM | POA: Insufficient documentation

## 2013-01-31 NOTE — ED Notes (Signed)
JYN:[id number]<AO> Expected date:01/31/13<BR> Expected time: 5:48 PM<BR> Means of arrival:<BR> Comments:<BR> Fall

## 2013-01-31 NOTE — ED Provider Notes (Signed)
History     CSN: [id number]  Arrival date & time 01/31/13  1752   First MD Initiated Contact with Patient 01/31/13 1847      Chief Complaint  Patient presents with  . Fall    (Consider location/radiation/quality/duration/timing/severity/associated sxs/prior treatment) HPI This 79 year old female lives at assisted living, at baseline she has generalized weakness and uses a walker usually needs assistance to go to the bathroom, she did go to the bathroom today, she called for assistance, assistance did not arrive she tried to go to the bathroom herself with her walker, she is typically too weak to walk alone with her walker so she fell and scraped her left forearm causing a skin tear, she is no pain, she is no head or neck injury, there is no amnesia for the event, she is no new neck pain back pain, she is no chest pain or abdominal pain today although she has a history of chronic abdominal pain for last few months it is not bothering her today, she is no shortness of breath, she is no new extremity pains, she is no new focal weakness or numbness just a baseline generalized weakness, she states her tetanus shot is up-to-date. Her only complaint is a skin tear to her left forearm. She has tissue paper thin friable fragile skin. Past Medical History  Diagnosis Date  . Arthritis   . History of chicken pox   . Depression     husband died 09-04-2011  . Glaucoma(365)   . Thyroid disease   . Hypertension   . Hypokalemia   . Renal disorder   . Altered mental state   . Blind   . Macular degeneration   . Abrasion of arm, left 02/04/2013    Past Surgical History  Procedure Laterality Date  . Revision total knee arthroplasty      right knee  . Back surgery    . Cataract extraction, bilateral    . Thyroidectomy    . Abdominal hysterectomy      Family History  Problem Relation Age of Onset  . Hypertension      History  Substance Use Topics  . Smoking status: Never Smoker   . Smokeless  tobacco: Never Used  . Alcohol Use: No    OB History   Grav Para Term Preterm Abortions TAB SAB Ect Mult Living                  Review of Systems 10 Systems reviewed and are negative for acute change except as noted in the HPI. Allergies  Codeine; Doxycycline; Fentanyl; and Lisinopril  Home Medications   Current Outpatient Rx  Name  Route  Sig  Dispense  Refill  . amLODipine (NORVASC) 5 MG tablet   Oral   Take 5 mg by mouth daily.         Marland Kitchen atenolol (TENORMIN) 50 MG tablet   Oral   Take 50 mg by mouth daily.         . fluticasone (FLONASE) 50 MCG/ACT nasal spray   Nasal   Place 2 sprays into the nose daily.         . furosemide (LASIX) 20 MG tablet   Oral   Take 20 mg by mouth daily.         Marland Kitchen gabapentin (NEURONTIN) 300 MG capsule   Oral   Take 300 mg by mouth at bedtime.         Marland Kitchen HYDROcodone-acetaminophen (NORCO/VICODIN) 5-325 MG per tablet  Oral   Take 0.5 tablets by mouth 2 (two) times daily as needed for pain.   30 tablet   2   . levothyroxine (SYNTHROID, LEVOTHROID) 88 MCG tablet   Oral   Take 88 mcg by mouth daily.          Marland Kitchen loperamide (IMODIUM) 2 MG capsule   Oral   Take 2 mg by mouth 4 (four) times daily as needed. For diarrhea.         . magnesium oxide (MAG-OX 400) 400 MG tablet   Oral   Take 1 tablet (400 mg total) by mouth daily.   30 tablet   11   . Multiple Vitamins-Minerals (PRESERVISION AREDS 2 PO)   Oral   Take 1 tablet by mouth daily.         Marland Kitchen omeprazole (PRILOSEC) 20 MG capsule   Oral   Take 1 capsule (20 mg total) by mouth daily.   90 capsule   0   . rOPINIRole (REQUIP) 1 MG tablet   Oral   Take 1-2 mg by mouth 4 (four) times daily. 1mg  at 2AM; 2mg  at 8AM; 1mg  at [facility]; 2mg  at 8PM         . sertraline (ZOLOFT) 100 MG tablet   Oral   Take 1 tablet (100 mg total) by mouth daily.   30 tablet   6   . triamcinolone ointment (KENALOG) 0.1 %   Topical   Apply topically 2 (two) times daily.   90 g    1   . vitamin B-12 (CYANOCOBALAMIN) 1000 MCG tablet   Oral   Take 1,000 mcg by mouth daily.         Marland Kitchen docusate sodium (COLACE) 100 MG capsule   Oral   Take 1 capsule (100 mg total) by mouth daily as needed for constipation. Take if no BM at day two despite taking daily 1 Docusate   30 capsule   2   . docusate sodium (COLACE) 100 MG capsule   Oral   Take 1 capsule (100 mg total) by mouth daily.   30 capsule   3   . hyoscyamine (LEVSIN SL) 0.125 MG SL tablet   Sublingual   Place 1 tablet (0.125 mg total) under the tongue daily as needed for cramping.   30 tablet   0     BP 156/46  Pulse 65  Temp(Src) 97.7 F (36.5 C) (Oral)  Resp 18  SpO2 98%  Physical Exam  Nursing note and vitals reviewed. Constitutional: She is oriented to person, place, and time.  Awake, alert, nontoxic appearance with baseline speech for patient.  HENT:  Head: Atraumatic.  Mouth/Throat: No oropharyngeal exudate.  Eyes: EOM are normal. Pupils are equal, round, and reactive to light. Right eye exhibits no discharge. Left eye exhibits no discharge.  Neck: Neck supple.  Cervical spine nontender  Cardiovascular: Normal rate and regular rhythm.   Murmur heard. Pulmonary/Chest: Effort normal and breath sounds normal. No stridor. No respiratory distress. She has no wheezes. She has no rales. She exhibits no tenderness.  Abdominal: Soft. Bowel sounds are normal. She exhibits no mass. There is no tenderness. There is no rebound.  Musculoskeletal: She exhibits no edema and no tenderness.  Baseline ROM, moves extremities with no obvious new focal weakness. Left forearm has 2 superficial skin tears with tissue paper thin friable skin with wounds not amenable to primary closure, no foreign body noted, no significant deep structure involvement noted, no bony tenderness  or joint tenderness to her left arm, she has capillary refill less than 2 seconds to her left hand.  Lymphadenopathy:    She has no cervical  adenopathy.  Neurological: She is alert and oriented to person, place, and time.  Awake, alert, cooperative and aware of situation; motor strength bilaterally; sensation normal to light touch bilaterally; peripheral visual fields full to confrontation; no facial asymmetry; tongue midline; major cranial nerves appear intact; no pronator drift, normal finger to nose bilaterally  Skin: No rash noted.  Psychiatric: She has a normal mood and affect.    ED Course  Procedures (including critical care time)  Labs Reviewed - No data to display No results found.   1. Skin tear of forearm without complication, left, initial encounter       MDM  Pt stable in ED with no significant deterioration in condition.  Patient / Family / Caregiver informed of clinical course, understand medical decision-making process, and agree with plan.  I doubt any other EMC precluding discharge at this time.        Hurman Horn, MD 02/09/13 [phone]

## 2013-01-31 NOTE — ED Notes (Signed)
Pt from [facility].  Had a fall.  No LOC.  Skin tear noted to lt forearm and lt elbow.  Bleeding controlled.  Not c/o pain.  No deformity.  Pelvis stable.

## 2013-02-02 ENCOUNTER — Ambulatory Visit (INDEPENDENT_AMBULATORY_CARE_PROVIDER_SITE_OTHER): Payer: Medicare Other | Admitting: Family Medicine

## 2013-02-02 ENCOUNTER — Encounter: Payer: Self-pay | Admitting: Family Medicine

## 2013-02-02 VITALS — BP 110/60 | HR 52 | Temp 97.7°F | Ht 61.0 in | Wt 110.1 lb

## 2013-02-02 DIAGNOSIS — K5900 Constipation, unspecified: Secondary | ICD-10-CM

## 2013-02-02 DIAGNOSIS — D638 Anemia in other chronic diseases classified elsewhere: Secondary | ICD-10-CM

## 2013-02-02 DIAGNOSIS — I10 Essential (primary) hypertension: Secondary | ICD-10-CM

## 2013-02-02 DIAGNOSIS — R1013 Epigastric pain: Secondary | ICD-10-CM

## 2013-02-02 DIAGNOSIS — T148XXA Other injury of unspecified body region, initial encounter: Secondary | ICD-10-CM

## 2013-02-02 DIAGNOSIS — IMO0002 Reserved for concepts with insufficient information to code with codable children: Secondary | ICD-10-CM

## 2013-02-02 MED ORDER — HYOSCYAMINE SULFATE 0.125 MG SL SUBL: 0.1250 mg | SUBLINGUAL_TABLET | Freq: Every day | SUBLINGUAL | Status: DC | PRN

## 2013-02-02 MED ORDER — DOCUSATE SODIUM 100 MG PO CAPS: 100.0000 mg | ORAL_CAPSULE | Freq: Every day | ORAL | Status: DC

## 2013-02-02 MED ORDER — DOCUSATE SODIUM 100 MG PO CAPS: 100.0000 mg | ORAL_CAPSULE | Freq: Every day | ORAL | Status: DC | PRN

## 2013-02-02 NOTE — Patient Instructions (Signed)

## 2013-02-03 ENCOUNTER — Telehealth: Payer: Self-pay | Admitting: Internal Medicine

## 2013-02-03 NOTE — Telephone Encounter (Signed)
She lives at a facility, have them start miralax 17 gm in 8 oz of fluid po daily prn constipation and administer a Dulcolax supp at the same time, that will usually clean people out

## 2013-02-03 NOTE — Telephone Encounter (Signed)
Patient seen Dr.Blythe on 02/02/13, message states Dr.Tabori is patient's PCP

## 2013-02-03 NOTE — Telephone Encounter (Signed)
Pt is a [facility] pt- will forward to Dr Abner Greenspan

## 2013-02-03 NOTE — Telephone Encounter (Signed)
Information sent.//AB/CMA

## 2013-02-03 NOTE — Telephone Encounter (Signed)
[facility] Triage Call Report Triage Record Num: [id number] Operator: Rene Kocher Patient Name: [name] [name] Call Date & Time: 01/30/2013 9:50:19PM Patient Phone: [phone] PCP: Marguarite Arbour Patient Gender: Female PCP Fax : [phone] Patient DOB: 09-18-1919 Practice Name: [facility] Reason for Call: Caller: Bev/Other; PCP: Sheliah Hatch.; CB#: [phone]; Call regarding Calling for sonogram results; Patient has had problems with constipation for the last 2 weeks. 01/30/13 a sonogram was ordered at 1700 at the [facility]. States belly is distended and in intense pain internally and externally. Wants results. Estevan Oaks was able to look up results and states the radiologist says no acute findings for the patients symptoms. Advised Bev of Mother-in-laws results and offered to triage but refused. States has an appointment scheduled on 02/02/13 and if worse will take her to the ED. Protocol(s) Used: Office Note Recommended Outcome per Protocol: Information Noted and Sent to Office Reason for Outcome: Caller information to office Care Advice: ~

## 2013-02-04 ENCOUNTER — Encounter: Payer: Self-pay | Admitting: Family Medicine

## 2013-02-04 DIAGNOSIS — S40812A Abrasion of left upper arm, initial encounter: Secondary | ICD-10-CM | POA: Insufficient documentation

## 2013-02-04 HISTORY — DX: Abrasion of left upper arm, initial encounter: S40.812A

## 2013-02-04 NOTE — Assessment & Plan Note (Signed)
Had a recent fall and is not suffering with any pain but has 2 small abrasions. Will need daily dressing changes.

## 2013-02-04 NOTE — Telephone Encounter (Signed)
Thanks for catching that. Disregard orders for Miralax etc

## 2013-02-04 NOTE — Assessment & Plan Note (Signed)
Well controlled, no changes 

## 2013-02-04 NOTE — Assessment & Plan Note (Signed)
Likely constipation. Start fiber and probiotic,s stool softener. If no bm day 2-3 then try MOM and prune juice and dulcolax supp prn.

## 2013-02-04 NOTE — Telephone Encounter (Signed)
You addressed constipation at her appt after this call a nurse was done. Do you still want the other two medications sent to facility?

## 2013-02-04 NOTE — Assessment & Plan Note (Signed)
Mild, will continue to monitor. 

## 2013-02-04 NOTE — Progress Notes (Signed)
Patient ID: [name], female   DOB: February 28, 1919, 79 y.o.   MRN: [id number] [name] [name] [id number] Jun 17, 1919 02/04/2013      Progress Note-Follow Up  Subjective  Chief Complaint  Chief Complaint  Patient presents with  . Abdominal Pain    HPI  Patient is a 79 year old Caucasian female who is in today for ER follow up after falling. Is not having any residual pain but did have an abrasion on her arm. She was struggling with abdominal pain this past week mid abdomen so she was seen and ultrasound lab request on. No acute findings identified and she is feeling somewhat better. No nausea or vomiting no fevers or chills. No abdominal pain today. No constipation or diarrhea at present although she has been struggling intermittently with some low-grade constipation and straining. No bloody stool. No chest pain or palpitations.  Past Medical History  Diagnosis Date  . Arthritis   . History of chicken pox   . Depression     husband died 09-09-2011  . Glaucoma(365)   . Thyroid disease   . Hypertension   . Hypokalemia   . Renal disorder   . Altered mental state   . Blind   . Macular degeneration   . Abrasion of arm, left 02/04/2013    Past Surgical History  Procedure Laterality Date  . Revision total knee arthroplasty      right knee  . Back surgery    . Cataract extraction, bilateral    . Thyroidectomy    . Abdominal hysterectomy      Family History  Problem Relation Age of Onset  . Hypertension      History   Social History  . Marital Status: Widowed    Spouse Name: N/A    Number of Children: N/A  . Years of Education: N/A   Occupational History  . Not on file.   Social History Main Topics  . Smoking status: Never Smoker   . Smokeless tobacco: Never Used  . Alcohol Use: No  . Drug Use: No  . Sexually Active: No   Other Topics Concern  . Not on file   Social History Narrative  . No narrative on file    Current Outpatient Prescriptions on File Prior  to Visit  Medication Sig Dispense Refill  . amLODipine (NORVASC) 5 MG tablet Take 5 mg by mouth daily.      Marland Kitchen atenolol (TENORMIN) 50 MG tablet Take 50 mg by mouth daily.      . fluticasone (FLONASE) 50 MCG/ACT nasal spray Place 2 sprays into the nose daily.      . furosemide (LASIX) 20 MG tablet Take 20 mg by mouth daily.      Marland Kitchen gabapentin (NEURONTIN) 300 MG capsule Take 300 mg by mouth at bedtime.      Marland Kitchen HYDROcodone-acetaminophen (NORCO/VICODIN) 5-325 MG per tablet Take 0.5 tablets by mouth 2 (two) times daily as needed for pain.  30 tablet  2  . levothyroxine (SYNTHROID, LEVOTHROID) 88 MCG tablet Take 88 mcg by mouth daily.       Marland Kitchen loperamide (IMODIUM) 2 MG capsule Take 2 mg by mouth 4 (four) times daily as needed. For diarrhea.      . magnesium oxide (MAG-OX 400) 400 MG tablet Take 1 tablet (400 mg total) by mouth daily.  30 tablet  11  . Multiple Vitamins-Minerals (PRESERVISION AREDS 2 PO) Take 1 tablet by mouth daily.      Marland Kitchen omeprazole (PRILOSEC) 20 MG capsule  Take 1 capsule (20 mg total) by mouth daily.  90 capsule  0  . rOPINIRole (REQUIP) 1 MG tablet Take 1-2 mg by mouth 4 (four) times daily. 1mg  at 2AM; 2mg  at 8AM; 1mg  at [facility]; 2mg  at 8PM      . sertraline (ZOLOFT) 100 MG tablet Take 1 tablet (100 mg total) by mouth daily.  30 tablet  6  . triamcinolone ointment (KENALOG) 0.1 % Apply topically 2 (two) times daily.  90 g  1  . vitamin B-12 (CYANOCOBALAMIN) 1000 MCG tablet Take 1,000 mcg by mouth daily.       No current facility-administered medications on file prior to visit.    Allergies  Allergen Reactions  . Codeine Other (See Comments)    insomnia  . Doxycycline Diarrhea and Other (See Comments)    Hallucinations  . Fentanyl Hives and Rash  . Lisinopril Nausea And Vomiting, Rash and Hypertension    Review of Systems  Review of Systems  Constitutional: Negative for fever and malaise/fatigue.  HENT: Negative for congestion.   Eyes: Negative for discharge.  Respiratory:  Negative for shortness of breath.   Cardiovascular: Negative for chest pain, palpitations and leg swelling.  Gastrointestinal: Negative for nausea, abdominal pain and diarrhea.  Genitourinary: Negative for dysuria.  Musculoskeletal: Negative for falls.  Skin: Negative for rash.  Neurological: Negative for loss of consciousness and headaches.  Endo/Heme/Allergies: Negative for polydipsia.  Psychiatric/Behavioral: Negative for depression and suicidal ideas. The patient is not nervous/anxious and does not have insomnia.     Objective  BP 110/60  Pulse 52  Temp(Src) 97.7 F (36.5 C) (Oral)  Ht 5\' 1"  (1.549 m)  Wt 110 lb 1.3 oz (49.932 kg)  BMI 20.81 kg/m2  SpO2 95%  Physical Exam  Physical Exam  Constitutional: She is oriented to person, place, and time and well-developed, well-nourished, and in no distress. No distress.  frail  HENT:  Head: Normocephalic and atraumatic.  Eyes: Conjunctivae are normal.  Neck: Neck supple. No thyromegaly present.  Cardiovascular: Normal rate, regular rhythm and normal heart sounds.   Pulmonary/Chest: Effort normal and breath sounds normal. She has no wheezes.  Abdominal: She exhibits no distension and no mass.  Musculoskeletal: She exhibits no edema.  Lymphadenopathy:    She has no cervical adenopathy.  Neurological: She is alert and oriented to person, place, and time.  Skin: Skin is warm and dry. No rash noted. She is not diaphoretic.  Abrasions left arm covered by bandages. No surrounding erythema or tenderness or swelling  Psychiatric: Memory, affect and judgment normal.    Lab Results  Component Value Date   TSH 0.848 09/11/2012   Lab Results  Component Value Date   WBC 6.4 01/19/2013   HGB 11.7* 01/19/2013   HCT 33.8* 01/19/2013   MCV 91.7 01/19/2013   PLT 184.0 01/19/2013   Lab Results  Component Value Date   CREATININE 1.0 01/19/2013   BUN 20 01/19/2013   NA 138 01/19/2013   K 3.9 01/19/2013   CL 101 01/19/2013   CO2 29 01/19/2013    Lab Results  Component Value Date   ALT 19 01/19/2013   AST 27 01/19/2013   ALKPHOS 253* 01/19/2013   BILITOT 0.5 01/19/2013   No results found for this basename: CHOL    Assessment & Plan  Abdominal pain, epigastric Likely constipation. Start fiber and probiotic,s stool softener. If no bm day 2-3 then try MOM and prune juice and dulcolax supp prn.   HTN (  hypertension) Well controlled, no changes.   Abrasion of arm, left Had a recent fall and is not suffering with any pain but has 2 small abrasions. Will need daily dressing changes.  Anemia of chronic disease Mild, will continue to monitor

## 2013-02-09 ENCOUNTER — Telehealth: Payer: Self-pay

## 2013-02-09 ENCOUNTER — Ambulatory Visit: Payer: Self-pay | Admitting: Family Medicine

## 2013-02-09 NOTE — Telephone Encounter (Signed)
Please advise 

## 2013-02-09 NOTE — Telephone Encounter (Signed)
Have them try Carafate 1g/74ml, 1 gm po qid with meals and qhs x 30 days and see if that helps, then can switch to prn

## 2013-02-09 NOTE — Telephone Encounter (Signed)
Message copied by Court Joy on Mon Feb 09, 2013  1:20 PM ------      Message from: [facility], KRISTY R      Created: Mon Feb 09, 2013  1:14 PM       [facility]ccre[facility] called and this patient was having pain from her throat down to her abdominal area. The nurse thinks its acid reflux doesn't know if you wants to see her or call something into the pharmacy. ------

## 2013-02-10 MED ORDER — SUCRALFATE 1 GM/10ML PO SUSP: 1.0000 g | ORAL | Status: DC

## 2013-02-10 NOTE — Telephone Encounter (Signed)
Disregard last question

## 2013-02-10 NOTE — Telephone Encounter (Signed)
Do you want this qid and qhs or tid and qhs?

## 2013-02-11 ENCOUNTER — Telehealth: Payer: Self-pay

## 2013-02-11 NOTE — Telephone Encounter (Signed)
Opened in error

## 2013-02-13 ENCOUNTER — Ambulatory Visit: Payer: Self-pay | Admitting: Family Medicine

## 2013-02-17 ENCOUNTER — Other Ambulatory Visit: Payer: Self-pay

## 2013-02-17 DIAGNOSIS — R269 Unspecified abnormalities of gait and mobility: Secondary | ICD-10-CM

## 2013-02-17 DIAGNOSIS — L8993 Pressure ulcer of unspecified site, stage 3: Secondary | ICD-10-CM

## 2013-02-17 DIAGNOSIS — L89109 Pressure ulcer of unspecified part of back, unspecified stage: Secondary | ICD-10-CM

## 2013-02-17 DIAGNOSIS — N390 Urinary tract infection, site not specified: Secondary | ICD-10-CM

## 2013-02-17 MED ORDER — HYDROCODONE-ACETAMINOPHEN 5-325 MG PO TABS: 0.5000 | ORAL_TABLET | Freq: Two times a day (BID) | ORAL | Status: DC | PRN

## 2013-02-17 NOTE — Telephone Encounter (Signed)
Verbal per md ok to refill

## 2013-02-19 ENCOUNTER — Encounter: Payer: Self-pay | Admitting: Family Medicine

## 2013-02-19 ENCOUNTER — Ambulatory Visit (INDEPENDENT_AMBULATORY_CARE_PROVIDER_SITE_OTHER): Payer: Medicare Other | Admitting: Family Medicine

## 2013-02-19 VITALS — BP 152/72 | HR 63 | Temp 98.1°F | Ht 61.0 in | Wt 114.0 lb

## 2013-02-19 DIAGNOSIS — I10 Essential (primary) hypertension: Secondary | ICD-10-CM

## 2013-02-19 DIAGNOSIS — R109 Unspecified abdominal pain: Secondary | ICD-10-CM

## 2013-02-19 DIAGNOSIS — R1013 Epigastric pain: Secondary | ICD-10-CM

## 2013-02-19 MED ORDER — PROBIOTIC PRODUCT PO CHEW: CHEWABLE_TABLET | ORAL | Status: DC

## 2013-02-19 NOTE — Patient Instructions (Addendum)
Back Pain, Adult Low back pain is very common. About 1 in 5 people have back pain.The cause of low back pain is rarely dangerous. The pain often gets better over time.About half of people with a sudden onset of back pain feel better in just 2 weeks. About 8 in 10 people feel better by 6 weeks.  CAUSES Some common causes of back pain include:  Strain of the muscles or ligaments supporting the spine.  Wear and tear (degeneration) of the spinal discs.  Arthritis.  Direct injury to the back. DIAGNOSIS Most of the time, the direct cause of low back pain is not known.However, back pain can be treated effectively even when the exact cause of the pain is unknown.Answering your caregiver's questions about your overall health and symptoms is one of the most accurate ways to make sure the cause of your pain is not dangerous. If your caregiver needs more information, he or she may order lab work or imaging tests (X-rays or MRIs).However, even if imaging tests show changes in your back, this usually does not require surgery. HOME CARE INSTRUCTIONS For many people, back pain returns.Since low back pain is rarely dangerous, it is often a condition that people can learn to manageon their own.   Remain active. It is stressful on the back to sit or stand in one place. Do not sit, drive, or stand in one place for more than 30 minutes at a time. Take short walks on level surfaces as soon as pain allows.Try to increase the length of time you walk each day.  Do not stay in bed.Resting more than 1 or 2 days can delay your recovery.  Do not avoid exercise or work.Your body is made to move.It is not dangerous to be active, even though your back may hurt.Your back will likely heal faster if you return to being active before your pain is gone.  Pay attention to your body when you bend and lift. Many people have less discomfortwhen lifting if they bend their knees, keep the load close to their bodies,and  avoid twisting. Often, the most comfortable positions are those that put less stress on your recovering back.  Find a comfortable position to sleep. Use a firm mattress and lie on your side with your knees slightly bent. If you lie on your back, put a pillow under your knees.  Only take over-the-counter or prescription medicines as directed by your caregiver. Over-the-counter medicines to reduce pain and inflammation are often the most helpful.Your caregiver may prescribe muscle relaxant drugs.These medicines help dull your pain so you can more quickly return to your normal activities and healthy exercise.  Put ice on the injured area.  Put ice in a plastic bag.  Place a towel between your skin and the bag.  Leave the ice on for 15 to 20 minutes, 3 to 4 times a day for the first 2 to 3 days. After that, ice and heat may be alternated to reduce pain and spasms.  Ask your caregiver about trying back exercises and gentle massage. This may be of some benefit.  Avoid feeling anxious or stressed.Stress increases muscle tension and can worsen back pain.It is important to recognize when you are anxious or stressed and learn ways to manage it.Exercise is a great option. SEEK MEDICAL CARE IF:  You have pain that is not relieved with rest or medicine.  You have pain that does not improve in 1 week.  You have new symptoms.  You are generally   not feeling well. SEEK IMMEDIATE MEDICAL CARE IF:   You have pain that radiates from your back into your legs.  You develop new bowel or bladder control problems.  You have unusual weakness or numbness in your arms or legs.  You develop nausea or vomiting.  You develop abdominal pain.  You feel faint. Document Released: 11/26/2005 Document Revised: 05/27/2012 Document Reviewed: 04/16/2011 Exit[employer] Patient Information 2013 [employer].  

## 2013-02-22 ENCOUNTER — Encounter: Payer: Self-pay | Admitting: Family Medicine

## 2013-02-22 NOTE — Progress Notes (Signed)
Patient ID: [name] [name], female   DOB: 05/30/1919, 79 y.o.   MRN: [id number] Shakeria Robinette [id number] 05-Jan-1919 02/22/2013      Progress Note-Follow Up  Subjective  Chief Complaint  Chief Complaint  Patient presents with  . Follow-up    HPI  79 year old Caucasian female who is in in by her son and daughter-in-law. She's had persistent complaints of abdominal pain since she was last seen here. On questioning in the office so her pain seems to subside yesterday and has not returned. Pain is always in the left flank radiating to epigastrium. It is worse with position changes most likely worse when she sits in her wheelchair, and time and is improved when she lies down. Does not seem to correlate with eating or not eating. Bowels are moving relatively well. No fevers or chills. No urinary complaints. No significant heartburn, chest pain, palpitations, shortness of breath.  Past Medical History  Diagnosis Date  . Arthritis   . History of chicken pox   . Depression     husband died 09/17/11  . Glaucoma(365)   . Thyroid disease   . Hypertension   . Hypokalemia   . Renal disorder   . Altered mental state   . Blind   . Macular degeneration   . Abrasion of arm, left 02/04/2013    Past Surgical History  Procedure Laterality Date  . Revision total knee arthroplasty      right knee  . Back surgery    . Cataract extraction, bilateral    . Thyroidectomy    . Abdominal hysterectomy      Family History  Problem Relation Age of Onset  . Hypertension      History   Social History  . Marital Status: Widowed    Spouse Name: N/A    Number of Children: N/A  . Years of Education: N/A   Occupational History  . Not on file.   Social History Main Topics  . Smoking status: Never Smoker   . Smokeless tobacco: Never Used  . Alcohol Use: No  . Drug Use: No  . Sexually Active: No   Other Topics Concern  . Not on file   Social History Narrative  . No narrative on file     Current Outpatient Prescriptions on File Prior to Visit  Medication Sig Dispense Refill  . amLODipine (NORVASC) 5 MG tablet Take 5 mg by mouth daily.      Marland Kitchen atenolol (TENORMIN) 50 MG tablet Take 50 mg by mouth daily.      Marland Kitchen docusate sodium (COLACE) 100 MG capsule Take 1 capsule (100 mg total) by mouth daily as needed for constipation. Take if no BM at day two despite taking daily 1 Docusate  30 capsule  2  . docusate sodium (COLACE) 100 MG capsule Take 1 capsule (100 mg total) by mouth daily.  30 capsule  3  . fluticasone (FLONASE) 50 MCG/ACT nasal spray Place 2 sprays into the nose daily.      . furosemide (LASIX) 20 MG tablet Take 20 mg by mouth daily.      Marland Kitchen gabapentin (NEURONTIN) 300 MG capsule Take 300 mg by mouth at bedtime.      Marland Kitchen HYDROcodone-acetaminophen (NORCO/VICODIN) 5-325 MG per tablet Take 0.5 tablets by mouth 2 (two) times daily as needed for pain.  30 tablet  2  . hyoscyamine (LEVSIN SL) 0.125 MG SL tablet Place 1 tablet (0.125 mg total) under the tongue daily as needed for cramping.  30 tablet  0  . levothyroxine (SYNTHROID, LEVOTHROID) 88 MCG tablet Take 88 mcg by mouth daily.       Marland Kitchen loperamide (IMODIUM) 2 MG capsule Take 2 mg by mouth 4 (four) times daily as needed. For diarrhea.      . magnesium oxide (MAG-OX 400) 400 MG tablet Take 1 tablet (400 mg total) by mouth daily.  30 tablet  11  . Multiple Vitamins-Minerals (PRESERVISION AREDS 2 PO) Take 1 tablet by mouth daily.      Marland Kitchen omeprazole (PRILOSEC) 20 MG capsule Take 1 capsule (20 mg total) by mouth daily.  90 capsule  0  . rOPINIRole (REQUIP) 1 MG tablet Take 1-2 mg by mouth 4 (four) times daily. 1mg  at 2AM; 2mg  at 8AM; 1mg  at [facility]; 2mg  at 8PM      . sertraline (ZOLOFT) 100 MG tablet Take 1 tablet (100 mg total) by mouth daily.  30 tablet  6  . sucralfate (CARAFATE) 1 GM/10ML suspension Take 10 mLs (1 g total) by mouth as directed. Qid w/meals and qhs  420 mL  0  . triamcinolone ointment (KENALOG) 0.1 % Apply topically  2 (two) times daily.  90 g  1  . vitamin B-12 (CYANOCOBALAMIN) 1000 MCG tablet Take 1,000 mcg by mouth daily.       No current facility-administered medications on file prior to visit.    Allergies  Allergen Reactions  . Codeine Other (See Comments)    insomnia  . Doxycycline Diarrhea and Other (See Comments)    Hallucinations  . Fentanyl Hives and Rash  . Lisinopril Nausea And Vomiting, Rash and Hypertension    Review of Systems  Review of Systems  Constitutional: Negative for fever and malaise/fatigue.  HENT: Negative for congestion.   Eyes: Negative for discharge.  Respiratory: Negative for shortness of breath.   Cardiovascular: Negative for chest pain, palpitations and leg swelling.  Gastrointestinal: Positive for abdominal pain. Negative for nausea and diarrhea.  Genitourinary: Negative for dysuria.  Musculoskeletal: Negative for falls.  Skin: Negative for rash.  Neurological: Negative for loss of consciousness and headaches.  Endo/Heme/Allergies: Negative for polydipsia.  Psychiatric/Behavioral: Negative for depression and suicidal ideas. The patient is not nervous/anxious and does not have insomnia.     Objective  BP 152/72  Pulse 63  Temp(Src) 98.1 F (36.7 C) (Oral)  Ht 5\' 1"  (1.549 m)  Wt 114 lb (51.71 kg)  BMI 21.55 kg/m2  SpO2 96%  Physical Exam  Physical Exam  Constitutional: She is oriented to person, place, and time and well-developed, well-nourished, and in no distress. No distress.  Frail, kyphotic slumped over in wheelchair  HENT:  Head: Normocephalic and atraumatic.  Eyes: Conjunctivae are normal.  Neck: Neck supple. No thyromegaly present.  Cardiovascular: Normal rate, regular rhythm and normal heart sounds.   No murmur heard. Pulmonary/Chest: Effort normal and breath sounds normal. She has no wheezes.  Abdominal: She exhibits no distension and no mass.  Musculoskeletal: She exhibits no edema.  Lymphadenopathy:    She has no cervical  adenopathy.  Neurological: She is alert and oriented to person, place, and time.  Skin: Skin is warm and dry. No rash noted. She is not diaphoretic.  Psychiatric: Memory, affect and judgment normal.    Lab Results  Component Value Date   TSH 0.848 09/11/2012   Lab Results  Component Value Date   WBC 6.4 01/19/2013   HGB 11.7* 01/19/2013   HCT 33.8* 01/19/2013   MCV 91.7 01/19/2013  PLT 184.0 01/19/2013   Lab Results  Component Value Date   CREATININE 1.0 01/19/2013   BUN 20 01/19/2013   NA 138 01/19/2013   K 3.9 01/19/2013   CL 101 01/19/2013   CO2 29 01/19/2013   Lab Results  Component Value Date   ALT 19 01/19/2013   AST 27 01/19/2013   ALKPHOS 253* 01/19/2013   BILITOT 0.5 01/19/2013     Assessment & Plan  HTN (hypertension) Mild elevation but would not change patient meds a this time,   Abdominal pain, epigastric No pain at this time. But overwhelming pain at times, patient denies any correlation with eatin g or not eating. Is position related, better when she lies down. Consider colon spasm vs muculoskeletal cause. Family very concerned due to the severity of her pain at this time. Will proceed with CT abd without IV contrast to r/o concerning pathology and then will treat symptoms. Given Hyoscyamine to use prn for possible colon spasm.  Encouraged to start daily probiotics

## 2013-02-22 NOTE — Assessment & Plan Note (Signed)
Mild elevation but would not change patient meds a this time,

## 2013-02-22 NOTE — Assessment & Plan Note (Addendum)
No pain at this time. But overwhelming pain at times, patient denies any correlation with eatin g or not eating. Is position related, better when she lies down. Consider colon spasm vs muculoskeletal cause. Family very concerned due to the severity of her pain at this time. Will proceed with CT abd without IV contrast to r/o concerning pathology and then will treat symptoms. Given Hyoscyamine to use prn for possible colon spasm.  Encouraged to start daily probiotics

## 2013-02-23 ENCOUNTER — Telehealth: Payer: Self-pay

## 2013-02-23 NOTE — Telephone Encounter (Signed)
[name] is sending a copy of pts medication list

## 2013-02-23 NOTE — Telephone Encounter (Signed)
Message copied by Court Joy on Mon Feb 23, 2013 11:53 AM ------      Message from: Danise Edge A      Created: Sun Feb 22, 2013  6:33 PM       Please have her facility send [place] a copy of her MAR so I can see everything they have given her in the past 2 weeks ------

## 2013-02-27 ENCOUNTER — Ambulatory Visit (HOSPITAL_BASED_OUTPATIENT_CLINIC_OR_DEPARTMENT_OTHER)
Admission: RE | Admit: 2013-02-27 | Discharge: 2013-02-27 | Disposition: A | Discharge status: Home / Self Care | Payer: Medicare Other | Source: Ambulatory Visit | Attending: Family Medicine | Admitting: Family Medicine

## 2013-02-27 DIAGNOSIS — R109 Unspecified abdominal pain: Secondary | ICD-10-CM

## 2013-02-27 DIAGNOSIS — R1032 Left lower quadrant pain: Secondary | ICD-10-CM | POA: Insufficient documentation

## 2013-03-03 ENCOUNTER — Telehealth: Payer: Self-pay | Admitting: Internal Medicine

## 2013-03-03 ENCOUNTER — Telehealth: Payer: Self-pay

## 2013-03-03 MED ORDER — SENNA 8.6 MG PO TABS: 1.0000 | ORAL_TABLET | Freq: Every day | ORAL | Status: DC

## 2013-03-03 NOTE — Telephone Encounter (Signed)
[facility] called in requesting results from test that was done last Friday

## 2013-03-03 NOTE — Telephone Encounter (Signed)
Per MD take only 1 Docusate daily and 1 Senna due to pts son saying the Barium cleaned pt out and isn't complaining about pain.  RX sent to [facility]

## 2013-03-03 NOTE — Progress Notes (Signed)
Quick Note:  Patient son informed and voiced understanding.  Go ahead and proceed with the referral per son ______

## 2013-03-03 NOTE — Telephone Encounter (Signed)
I spoke with pts son about this earlier and just informed [facility]

## 2013-03-05 ENCOUNTER — Ambulatory Visit: Payer: Self-pay | Admitting: Family Medicine

## 2013-03-16 ENCOUNTER — Encounter: Payer: Self-pay | Admitting: Family Medicine

## 2013-03-16 ENCOUNTER — Ambulatory Visit (INDEPENDENT_AMBULATORY_CARE_PROVIDER_SITE_OTHER): Payer: Medicare Other | Admitting: Family Medicine

## 2013-03-16 VITALS — BP 128/62 | HR 61 | Temp 98.0°F | Ht 61.0 in | Wt 114.0 lb

## 2013-03-16 DIAGNOSIS — I10 Essential (primary) hypertension: Secondary | ICD-10-CM

## 2013-03-16 DIAGNOSIS — J300 Vasomotor rhinitis: Secondary | ICD-10-CM

## 2013-03-16 DIAGNOSIS — J309 Allergic rhinitis, unspecified: Secondary | ICD-10-CM

## 2013-03-16 DIAGNOSIS — J069 Acute upper respiratory infection, unspecified: Secondary | ICD-10-CM

## 2013-03-16 DIAGNOSIS — R5381 Other malaise: Secondary | ICD-10-CM

## 2013-03-16 DIAGNOSIS — S22009A Unspecified fracture of unspecified thoracic vertebra, initial encounter for closed fracture: Secondary | ICD-10-CM

## 2013-03-16 DIAGNOSIS — J3489 Other specified disorders of nose and nasal sinuses: Secondary | ICD-10-CM

## 2013-03-16 DIAGNOSIS — R1013 Epigastric pain: Secondary | ICD-10-CM

## 2013-03-16 DIAGNOSIS — S22080A Wedge compression fracture of T11-T12 vertebra, initial encounter for closed fracture: Secondary | ICD-10-CM

## 2013-03-16 DIAGNOSIS — R0981 Nasal congestion: Secondary | ICD-10-CM

## 2013-03-16 MED ORDER — GUAIFENESIN ER 600 MG PO TB12: 600.0000 mg | ORAL_TABLET | Freq: Two times a day (BID) | ORAL | Status: DC

## 2013-03-16 MED ORDER — AZELASTINE HCL 0.1 % NA SOLN: 2.0000 | Freq: Every day | NASAL | Status: DC

## 2013-03-16 NOTE — Patient Instructions (Addendum)

## 2013-03-21 ENCOUNTER — Encounter: Payer: Self-pay | Admitting: Family Medicine

## 2013-03-21 DIAGNOSIS — R5381 Other malaise: Secondary | ICD-10-CM

## 2013-03-21 DIAGNOSIS — J300 Vasomotor rhinitis: Secondary | ICD-10-CM

## 2013-03-21 DIAGNOSIS — S22080A Wedge compression fracture of T11-T12 vertebra, initial encounter for closed fracture: Secondary | ICD-10-CM

## 2013-03-21 DIAGNOSIS — R7989 Other specified abnormal findings of blood chemistry: Secondary | ICD-10-CM | POA: Insufficient documentation

## 2013-03-21 DIAGNOSIS — R945 Abnormal results of liver function studies: Secondary | ICD-10-CM

## 2013-03-21 DIAGNOSIS — R0981 Nasal congestion: Secondary | ICD-10-CM

## 2013-03-21 HISTORY — DX: Abnormal results of liver function studies: R94.5

## 2013-03-21 HISTORY — DX: Wedge compression fracture of T11-T12 vertebra, initial encounter for closed fracture: S22.080A

## 2013-03-21 HISTORY — DX: Other malaise: R53.81

## 2013-03-21 HISTORY — DX: Other specified abnormal findings of blood chemistry: R79.89

## 2013-03-21 HISTORY — DX: Vasomotor rhinitis: J30.0

## 2013-03-21 HISTORY — DX: Nasal congestion: R09.81

## 2013-03-21 NOTE — Assessment & Plan Note (Signed)
Greatly improved with improved with BM, son is with her and notes she is no longer complaining. Continue same care.

## 2013-03-21 NOTE — Progress Notes (Signed)
Patient ID: [name] [name], female   DOB: [birth year], 79 y.o.   MRN: [id number] Tremaine Fuhriman [id number] Jul 07, 1919 03/21/2013      Progress Note-Follow Up  Subjective  Chief Complaint  Chief Complaint  Patient presents with  . Follow-up    6 week    HPI  Patient is a 79 year old female who is in today with her son. She is feeling better than at her last visit. We've been able to get her bowels moving and she's not had any further abdominal pain. Her complaints of flank pain and back pain have greatly improved. She's eating well and has not had any febrile illness or new patient to complaints. She is noting some increased nasal congestion with occasional clear rhinorrhea. No headaches or ear pain. No sore throat or cough. This nasal congestion appears to be random throughout the day  Past Medical History  Diagnosis Date  . Arthritis   . History of chicken pox   . Depression     husband died 09-20-11  . Glaucoma(365)   . Thyroid disease   . Hypertension   . Hypokalemia   . Renal disorder   . Altered mental state   . Blind   . Macular degeneration   . Abrasion of arm, left 02/04/2013  . Compression fracture of T12 vertebra 03/21/2013  . Debility 03/21/2013  . Nasal congestion 03/21/2013  . Vasomotor rhinitis 03/21/2013    Past Surgical History  Procedure Laterality Date  . Revision total knee arthroplasty      right knee  . Back surgery    . Cataract extraction, bilateral    . Thyroidectomy    . Abdominal hysterectomy      Family History  Problem Relation Age of Onset  . Hypertension      History   Social History  . Marital Status: Widowed    Spouse Name: N/A    Number of Children: N/A  . Years of Education: N/A   Occupational History  . Not on file.   Social History Main Topics  . Smoking status: Never Smoker   . Smokeless tobacco: Never Used  . Alcohol Use: No  . Drug Use: No  . Sexually Active: No   Other Topics Concern  . Not on file   Social  History Narrative  . No narrative on file    Current Outpatient Prescriptions on File Prior to Visit  Medication Sig Dispense Refill  . amLODipine (NORVASC) 5 MG tablet Take 5 mg by mouth daily.      Marland Kitchen atenolol (TENORMIN) 50 MG tablet Take 50 mg by mouth daily.      Marland Kitchen docusate sodium (COLACE) 100 MG capsule Take 1 capsule (100 mg total) by mouth daily as needed for constipation. Take if no BM at day two despite taking daily 1 Docusate  30 capsule  2  . docusate sodium (COLACE) 100 MG capsule Take 1 capsule (100 mg total) by mouth daily.  30 capsule  3  . fluticasone (FLONASE) 50 MCG/ACT nasal spray Place 2 sprays into the nose daily.      . furosemide (LASIX) 20 MG tablet Take 20 mg by mouth daily.      Marland Kitchen gabapentin (NEURONTIN) 300 MG capsule Take 300 mg by mouth at bedtime.      Marland Kitchen HYDROcodone-acetaminophen (NORCO/VICODIN) 5-325 MG per tablet Take 0.5 tablets by mouth 2 (two) times daily as needed for pain.  30 tablet  2  . hyoscyamine (LEVSIN SL) 0.125 MG SL tablet  Place 1 tablet (0.125 mg total) under the tongue daily as needed for cramping.  30 tablet  0  . levothyroxine (SYNTHROID, LEVOTHROID) 88 MCG tablet Take 88 mcg by mouth daily.       Marland Kitchen loperamide (IMODIUM) 2 MG capsule Take 2 mg by mouth 4 (four) times daily as needed. For diarrhea.      . magnesium oxide (MAG-OX 400) 400 MG tablet Take 1 tablet (400 mg total) by mouth daily.  30 tablet  11  . Multiple Vitamins-Minerals (PRESERVISION AREDS 2 PO) Take 1 tablet by mouth daily.      Marland Kitchen omeprazole (PRILOSEC) 20 MG capsule Take 1 capsule (20 mg total) by mouth daily.  90 capsule  0  . Probiotic Product (MISC INTESTINAL FLORA REGULAT) CHEW Digestive Advantage gummies 1 tab twice daily  120 tablet  5  . rOPINIRole (REQUIP) 1 MG tablet Take 1-2 mg by mouth 4 (four) times daily. 1mg  at 2AM; 2mg  at 8AM; 1mg  at [facility]; 2mg  at 8PM      . senna (SENOKOT) 8.6 MG TABS Take 1 tablet (8.6 mg total) by mouth daily.  120 each  1  . sertraline (ZOLOFT) 100  MG tablet Take 1 tablet (100 mg total) by mouth daily.  30 tablet  6  . sucralfate (CARAFATE) 1 GM/10ML suspension Take 10 mLs (1 g total) by mouth as directed. Qid w/meals and qhs  420 mL  0  . triamcinolone ointment (KENALOG) 0.1 % Apply topically 2 (two) times daily.  90 g  1  . vitamin B-12 (CYANOCOBALAMIN) 1000 MCG tablet Take 1,000 mcg by mouth daily.       No current facility-administered medications on file prior to visit.    Allergies  Allergen Reactions  . Codeine Other (See Comments)    insomnia  . Doxycycline Diarrhea and Other (See Comments)    Hallucinations  . Fentanyl Hives and Rash  . Lisinopril Nausea And Vomiting, Rash and Hypertension    Review of Systems  Review of Systems  Constitutional: Negative for fever and malaise/fatigue.  HENT: Positive for congestion. Negative for sore throat.   Eyes: Negative for discharge.  Respiratory: Positive for sputum production. Negative for cough and shortness of breath.   Cardiovascular: Negative for chest pain, palpitations and leg swelling.  Gastrointestinal: Positive for abdominal pain. Negative for nausea and diarrhea.  Genitourinary: Negative for dysuria.  Musculoskeletal: Positive for back pain. Negative for falls.  Skin: Negative for rash.  Neurological: Negative for loss of consciousness and headaches.  Endo/Heme/Allergies: Negative for polydipsia.  Psychiatric/Behavioral: Negative for depression and suicidal ideas. The patient is not nervous/anxious and does not have insomnia.     Objective  BP 128/62  Pulse 61  Temp(Src) 98 F (36.7 C) (Oral)  Ht 5\' 1"  (1.549 m)  Wt 114 lb (51.71 kg)  BMI 21.55 kg/m2  SpO2 96%  Physical Exam  Physical Exam  Constitutional: She is oriented to person, place, and time and well-developed, well-nourished, and in no distress. No distress.  Frail, caucasian female in wheelchair  HENT:  Head: Normocephalic and atraumatic.  Eyes: Conjunctivae are normal.  Neck: Neck  supple. No thyromegaly present.  Cardiovascular: Normal rate, regular rhythm and normal heart sounds.   No murmur heard. Pulmonary/Chest: Effort normal and breath sounds normal. She has no wheezes.  Abdominal: She exhibits no distension and no mass.  Musculoskeletal: She exhibits no edema.  Lymphadenopathy:    She has no cervical adenopathy.  Neurological: She is alert and  oriented to person, place, and time.  Skin: Skin is warm and dry. No rash noted. She is not diaphoretic.  Psychiatric: Memory, affect and judgment normal.    Lab Results  Component Value Date   TSH 0.848 09/11/2012   Lab Results  Component Value Date   WBC 6.4 01/19/2013   HGB 11.7* 01/19/2013   HCT 33.8* 01/19/2013   MCV 91.7 01/19/2013   PLT 184.0 01/19/2013   Lab Results  Component Value Date   CREATININE 1.0 01/19/2013   BUN 20 01/19/2013   NA 138 01/19/2013   K 3.9 01/19/2013   CL 101 01/19/2013   CO2 29 01/19/2013   Lab Results  Component Value Date   ALT 19 01/19/2013   AST 27 01/19/2013   ALKPHOS 253* 01/19/2013   BILITOT 0.5 01/19/2013     Assessment & Plan  Abdominal pain, epigastric Greatly improved with improved with BM, son is with her and notes she is no longer complaining. Continue same care.  Compression fracture of T12 vertebra Noted on recent xray but now asymptomatic, no referral needed at this time  Debility Family agrees to some PT in her home to see if we can improve her mobility  HTN (hypertension) Well controlled, no changes  Vasomotor rhinitis Start Astelin daily and see if there is any imrpovement

## 2013-03-21 NOTE — Assessment & Plan Note (Signed)
Start Astelin daily and see if there is any imrpovement

## 2013-03-21 NOTE — Assessment & Plan Note (Signed)
Noted on recent xray but now asymptomatic, no referral needed at this time

## 2013-03-21 NOTE — Assessment & Plan Note (Signed)
Family agrees to some PT in her home to see if we can improve her mobility

## 2013-03-21 NOTE — Assessment & Plan Note (Signed)
Well controlled, no changes 

## 2013-03-31 ENCOUNTER — Other Ambulatory Visit: Payer: Self-pay

## 2013-03-31 MED ORDER — HYDROCODONE-ACETAMINOPHEN 5-325 MG PO TABS: 0.5000 | ORAL_TABLET | Freq: Two times a day (BID) | ORAL | Status: DC | PRN

## 2013-04-27 ENCOUNTER — Ambulatory Visit (INDEPENDENT_AMBULATORY_CARE_PROVIDER_SITE_OTHER): Payer: Medicare Other | Admitting: Family Medicine

## 2013-04-27 ENCOUNTER — Encounter: Payer: Self-pay | Admitting: Family Medicine

## 2013-04-27 VITALS — BP 138/80 | HR 61 | Temp 97.7°F | Ht 61.0 in | Wt 118.0 lb

## 2013-04-27 DIAGNOSIS — S22080D Wedge compression fracture of T11-T12 vertebra, subsequent encounter for fracture with routine healing: Secondary | ICD-10-CM

## 2013-04-27 DIAGNOSIS — K219 Gastro-esophageal reflux disease without esophagitis: Secondary | ICD-10-CM

## 2013-04-27 DIAGNOSIS — M8448XD Pathological fracture, other site, subsequent encounter for fracture with routine healing: Secondary | ICD-10-CM

## 2013-04-27 DIAGNOSIS — J300 Vasomotor rhinitis: Secondary | ICD-10-CM

## 2013-04-27 DIAGNOSIS — J309 Allergic rhinitis, unspecified: Secondary | ICD-10-CM

## 2013-04-27 DIAGNOSIS — E039 Hypothyroidism, unspecified: Secondary | ICD-10-CM

## 2013-04-27 DIAGNOSIS — I10 Essential (primary) hypertension: Secondary | ICD-10-CM

## 2013-04-27 DIAGNOSIS — G2581 Restless legs syndrome: Secondary | ICD-10-CM

## 2013-04-27 MED ORDER — CETIRIZINE HCL 10 MG PO TABS: 10.0000 mg | ORAL_TABLET | Freq: Every day | ORAL | Status: DC

## 2013-04-27 MED ORDER — CETIRIZINE HCL 10 MG PO TABS: 10.0000 mg | ORAL_TABLET | Freq: Every day | ORAL | Status: AC

## 2013-04-27 MED ORDER — ROPINIROLE HCL 1 MG PO TABS: 1.0000 mg | ORAL_TABLET | Freq: Four times a day (QID) | ORAL | Status: DC

## 2013-04-27 NOTE — Progress Notes (Signed)
Patient ID: [name] [name], female   DOB: [birth year], 79 y.o.   MRN: [id number] Vernetta Dizdarevic [id number] 1919/03/06 04/27/2013      Progress Note-Follow Up  Subjective  Chief Complaint  Chief Complaint  Patient presents with  . Follow-up    6 week    HPI  Patient is a 79 year old Caucasian female who is here today accompanied by her son and daughter-in-law. They report she has less complaints of pain. Her bowels are moving well and she is eating adequately. Her back pain has resolved. Her major complaint is of persistent vasomotor rhinitis. Astelin has only been marginally helpful. No fevers or chills. No complaints of headache or sore throat. No cough, shortness of breath, GI or GU concerns identified today.  Past Medical History  Diagnosis Date  . Arthritis   . History of chicken pox   . Depression     husband died Aug 27, 2011  . Glaucoma(365)   . Thyroid disease   . Hypertension   . Hypokalemia   . Renal disorder   . Altered mental state   . Blind   . Macular degeneration   . Abrasion of arm, left 02/04/2013  . Compression fracture of T12 vertebra 03/21/2013  . Debility 03/21/2013  . Nasal congestion 03/21/2013  . Vasomotor rhinitis 03/21/2013    Past Surgical History  Procedure Laterality Date  . Revision total knee arthroplasty      right knee  . Back surgery    . Cataract extraction, bilateral    . Thyroidectomy    . Abdominal hysterectomy      Family History  Problem Relation Age of Onset  . Hypertension      History   Social History  . Marital Status: Widowed    Spouse Name: N/A    Number of Children: N/A  . Years of Education: N/A   Occupational History  . Not on file.   Social History Main Topics  . Smoking status: Never Smoker   . Smokeless tobacco: Never Used  . Alcohol Use: No  . Drug Use: No  . Sexually Active: No   Other Topics Concern  . Not on file   Social History Narrative  . No narrative on file    Current Outpatient  Prescriptions on File Prior to Visit  Medication Sig Dispense Refill  . amLODipine (NORVASC) 5 MG tablet Take 5 mg by mouth daily.      Marland Kitchen atenolol (TENORMIN) 50 MG tablet Take 50 mg by mouth daily.      Marland Kitchen azelastine (ASTELIN) 137 MCG/SPRAY nasal spray Place 2 sprays into the nose daily. Use in each nostril as directed  30 mL  12  . docusate sodium (COLACE) 100 MG capsule Take 1 capsule (100 mg total) by mouth daily as needed for constipation. Take if no BM at day two despite taking daily 1 Docusate  30 capsule  2  . docusate sodium (COLACE) 100 MG capsule Take 1 capsule (100 mg total) by mouth daily.  30 capsule  3  . fluticasone (FLONASE) 50 MCG/ACT nasal spray Place 2 sprays into the nose daily.      . furosemide (LASIX) 20 MG tablet Take 20 mg by mouth daily.      Marland Kitchen gabapentin (NEURONTIN) 300 MG capsule Take 300 mg by mouth at bedtime.      Marland Kitchen guaiFENesin (MUCINEX) 600 MG 12 hr tablet Take 1 tablet (600 mg total) by mouth 2 (two) times daily. X 10 days  20 tablet  0  . HYDROcodone-acetaminophen (NORCO/VICODIN) 5-325 MG per tablet Take 0.5 tablets by mouth 2 (two) times daily as needed for pain.  30 tablet  2  . hyoscyamine (LEVSIN SL) 0.125 MG SL tablet Place 1 tablet (0.125 mg total) under the tongue daily as needed for cramping.  30 tablet  0  . levothyroxine (SYNTHROID, LEVOTHROID) 88 MCG tablet Take 88 mcg by mouth daily.       Marland Kitchen loperamide (IMODIUM) 2 MG capsule Take 2 mg by mouth 4 (four) times daily as needed. For diarrhea.      . magnesium oxide (MAG-OX 400) 400 MG tablet Take 1 tablet (400 mg total) by mouth daily.  30 tablet  11  . Multiple Vitamins-Minerals (PRESERVISION AREDS 2 PO) Take 1 tablet by mouth daily.      Marland Kitchen omeprazole (PRILOSEC) 20 MG capsule Take 1 capsule (20 mg total) by mouth daily.  90 capsule  0  . Probiotic Product (MISC INTESTINAL FLORA REGULAT) CHEW Digestive Advantage gummies 1 tab twice daily  120 tablet  5  . senna (SENOKOT) 8.6 MG TABS Take 1 tablet (8.6 mg  total) by mouth daily.  120 each  1  . sertraline (ZOLOFT) 100 MG tablet Take 1 tablet (100 mg total) by mouth daily.  30 tablet  6  . sucralfate (CARAFATE) 1 GM/10ML suspension Take 10 mLs (1 g total) by mouth as directed. Qid w/meals and qhs  420 mL  0  . triamcinolone ointment (KENALOG) 0.1 % Apply topically 2 (two) times daily.  90 g  1  . vitamin B-12 (CYANOCOBALAMIN) 1000 MCG tablet Take 1,000 mcg by mouth daily.       No current facility-administered medications on file prior to visit.    Allergies  Allergen Reactions  . Codeine Other (See Comments)    insomnia  . Doxycycline Diarrhea and Other (See Comments)    Hallucinations  . Fentanyl Hives and Rash  . Lisinopril Nausea And Vomiting, Rash and Hypertension    Review of Systems  Review of Systems  Constitutional: Negative for fever and malaise/fatigue.  HENT: Negative for congestion.   Eyes: Negative for discharge.  Respiratory: Negative for shortness of breath.   Cardiovascular: Negative for chest pain, palpitations and leg swelling.  Gastrointestinal: Negative for nausea, abdominal pain and diarrhea.  Genitourinary: Negative for dysuria.  Musculoskeletal: Negative for falls.  Skin: Negative for rash.  Neurological: Negative for loss of consciousness and headaches.  Endo/Heme/Allergies: Negative for polydipsia.  Psychiatric/Behavioral: Negative for depression and suicidal ideas. The patient is not nervous/anxious and does not have insomnia.     Objective  BP 138/80  Pulse 61  Temp(Src) 97.7 F (36.5 C) (Oral)  Ht 5\' 1"  (1.549 m)  Wt 118 lb 0.6 oz (53.543 kg)  BMI 22.32 kg/m2  SpO2 97%  Physical Exam  Physical Exam  Constitutional: She is oriented to person, place, and time and well-developed, well-nourished, and in no distress. No distress.  HENT:  Head: Normocephalic and atraumatic.  Eyes: Conjunctivae are normal.  Neck: Neck supple. No thyromegaly present.  Cardiovascular: Normal rate and regular  rhythm.   Murmur heard. Pulmonary/Chest: Effort normal and breath sounds normal. She has no wheezes.  Abdominal: She exhibits no distension and no mass.  Musculoskeletal: She exhibits no edema.  Lymphadenopathy:    She has no cervical adenopathy.  Neurological: She is alert and oriented to person, place, and time.  Skin: Skin is warm and dry. No rash noted. She is not diaphoretic.  Psychiatric: Memory, affect and judgment normal.    Lab Results  Component Value Date   TSH 0.848 09/11/2012   Lab Results  Component Value Date   WBC 6.4 01/19/2013   HGB 11.7* 01/19/2013   HCT 33.8* 01/19/2013   MCV 91.7 01/19/2013   PLT 184.0 01/19/2013   Lab Results  Component Value Date   CREATININE 1.0 01/19/2013   BUN 20 01/19/2013   NA 138 01/19/2013   K 3.9 01/19/2013   CL 101 01/19/2013   CO2 29 01/19/2013   Lab Results  Component Value Date   ALT 19 01/19/2013   AST 27 01/19/2013   ALKPHOS 253* 01/19/2013   BILITOT 0.5 01/19/2013     Assessment & Plan  HTN (hypertension) Well controlled no changes  Vasomotor rhinitis Minimal response to Astelin will attempt a trial of Astepro.  Hypothyroidism Well treated on current dose of meds  Compression fracture of T12 vertebra Pain is much better, no new c/o pain today  GERD (gastroesophageal reflux disease) Omeprazole working well

## 2013-04-27 NOTE — Patient Instructions (Addendum)
Tongue blade daily Consider Zyrtec/Cetirizine 10 mg daily for runny nose. If no results after a month then stop   Labs at or prior to next visit. Cbc, tsh, renal, hepatic

## 2013-05-01 NOTE — Assessment & Plan Note (Signed)
Minimal response to Astelin will attempt a trial of Astepro.

## 2013-05-01 NOTE — Assessment & Plan Note (Signed)
Pain is much better, no new c/o pain today

## 2013-05-01 NOTE — Assessment & Plan Note (Signed)
Well controlled no changes 

## 2013-05-01 NOTE — Assessment & Plan Note (Signed)
Omeprazole working well.

## 2013-05-01 NOTE — Assessment & Plan Note (Signed)
Well treated on current dose of meds

## 2013-05-05 ENCOUNTER — Telehealth: Payer: Self-pay | Admitting: Family Medicine

## 2013-05-05 NOTE — Telephone Encounter (Signed)
For confusion would recommend [facility] draw renal, cbc, urinalysis with culture

## 2013-05-05 NOTE — Telephone Encounter (Signed)
Please advise 

## 2013-05-05 NOTE — Telephone Encounter (Signed)
Patient daughter n law  Bev   Called wanting to know if labs can be order for pts sodium level, pt is a little confused  Pt is resident @ [facility]

## 2013-05-06 NOTE — Telephone Encounter (Signed)
Order sent to [facility]

## 2013-05-07 ENCOUNTER — Telehealth: Payer: Self-pay

## 2013-05-07 DIAGNOSIS — R410 Disorientation, unspecified: Secondary | ICD-10-CM

## 2013-05-07 NOTE — Telephone Encounter (Signed)
[name] with [facility] states they did the UA on pt but there doctors won't draw blood. [name] wanted to know if we have anyone that could go there to draw pts blood? I informed [name] that we do not she would need to come in (doesn't need appt) to get this done.  Orders placed

## 2013-05-11 ENCOUNTER — Telehealth: Payer: Self-pay

## 2013-05-11 NOTE — Telephone Encounter (Signed)
pts daughter in law left a message stating the pt did a urine dip with the [facility] and was wandering if her mother in law needed an antibiotic.  Verbal per MD there were only slight abnormolities that were probably due to contamination and we would contact the [facility] if an antibiotic needs to be used when the culture comes back.  Pts daughter in law informed and voiced understanding

## 2013-05-13 ENCOUNTER — Telehealth: Payer: Self-pay

## 2013-05-13 NOTE — Telephone Encounter (Signed)
pts daughter in law informed that urine culture in negative.

## 2013-05-20 ENCOUNTER — Ambulatory Visit (INDEPENDENT_AMBULATORY_CARE_PROVIDER_SITE_OTHER): Payer: Medicare Other | Admitting: Ophthalmology

## 2013-05-20 DIAGNOSIS — H3530 Unspecified macular degeneration: Secondary | ICD-10-CM

## 2013-05-20 DIAGNOSIS — H01009 Unspecified blepharitis unspecified eye, unspecified eyelid: Secondary | ICD-10-CM

## 2013-05-20 DIAGNOSIS — I10 Essential (primary) hypertension: Secondary | ICD-10-CM

## 2013-05-20 DIAGNOSIS — H43819 Vitreous degeneration, unspecified eye: Secondary | ICD-10-CM

## 2013-05-20 DIAGNOSIS — H35039 Hypertensive retinopathy, unspecified eye: Secondary | ICD-10-CM

## 2013-06-02 ENCOUNTER — Encounter: Payer: Self-pay | Admitting: Family Medicine

## 2013-06-02 ENCOUNTER — Ambulatory Visit (HOSPITAL_BASED_OUTPATIENT_CLINIC_OR_DEPARTMENT_OTHER)
Admission: RE | Admit: 2013-06-02 | Discharge: 2013-06-02 | Disposition: A | Discharge status: Home / Self Care | Payer: Medicare Other | Source: Ambulatory Visit | Attending: Family Medicine | Admitting: Family Medicine

## 2013-06-02 ENCOUNTER — Ambulatory Visit (INDEPENDENT_AMBULATORY_CARE_PROVIDER_SITE_OTHER): Payer: Medicare Other | Admitting: Family Medicine

## 2013-06-02 VITALS — BP 140/70 | HR 59 | Temp 98.0°F | Ht 61.0 in | Wt 123.0 lb

## 2013-06-02 DIAGNOSIS — R7989 Other specified abnormal findings of blood chemistry: Secondary | ICD-10-CM

## 2013-06-02 DIAGNOSIS — S22080S Wedge compression fracture of T11-T12 vertebra, sequela: Secondary | ICD-10-CM

## 2013-06-02 DIAGNOSIS — K044 Acute apical periodontitis of pulpal origin: Secondary | ICD-10-CM

## 2013-06-02 DIAGNOSIS — R52 Pain, unspecified: Secondary | ICD-10-CM

## 2013-06-02 DIAGNOSIS — K047 Periapical abscess without sinus: Secondary | ICD-10-CM

## 2013-06-02 DIAGNOSIS — J300 Vasomotor rhinitis: Secondary | ICD-10-CM

## 2013-06-02 DIAGNOSIS — R945 Abnormal results of liver function studies: Secondary | ICD-10-CM

## 2013-06-02 DIAGNOSIS — I10 Essential (primary) hypertension: Secondary | ICD-10-CM

## 2013-06-02 DIAGNOSIS — IMO0002 Reserved for concepts with insufficient information to code with codable children: Secondary | ICD-10-CM

## 2013-06-02 DIAGNOSIS — J309 Allergic rhinitis, unspecified: Secondary | ICD-10-CM

## 2013-06-02 MED ORDER — CEFDINIR 300 MG PO CAPS: 300.0000 mg | ORAL_CAPSULE | Freq: Two times a day (BID) | ORAL | Status: DC

## 2013-06-02 MED ORDER — CEFDINIR 300 MG PO CAPS: 300.0000 mg | ORAL_CAPSULE | Freq: Two times a day (BID) | ORAL | Status: AC

## 2013-06-02 NOTE — Patient Instructions (Addendum)
°  Dental Abscess °A dental abscess is a collection of infected fluid (pus) from a bacterial infection in the inner part of the tooth (pulp). It usually occurs at the end of the tooth's root.  °CAUSES  °· Severe tooth decay. °· Trauma to the tooth that allows bacteria to enter into the pulp, such as a broken or chipped tooth. °SYMPTOMS  °· Severe pain in and around the infected tooth. °· Swelling and redness around the abscessed tooth or in the mouth or face. °· Tenderness. °· Pus drainage. °· Bad breath. °· Bitter taste in the mouth. °· Difficulty swallowing. °· Difficulty opening the mouth. °· Nausea. °· Vomiting. °· Chills. °· Swollen neck glands. °DIAGNOSIS  °· A medical and dental history will be taken. °· An examination will be performed by tapping on the abscessed tooth. °· X-rays may be taken of the tooth to identify the abscess. °TREATMENT °The goal of treatment is to eliminate the infection. You may be prescribed antibiotic medicine to stop the infection from spreading. A root canal may be performed to save the tooth. If the tooth cannot be saved, it may be pulled (extracted) and the abscess may be drained.  °HOME CARE INSTRUCTIONS °· Only take over-the-counter or prescription medicines for pain, fever, or discomfort as directed by your caregiver. °· Rinse your mouth (gargle) often with salt water (¼ tsp salt in 8 oz [250 ml] of warm water) to relieve pain or swelling. °· Do not drive after taking pain medicine (narcotics). °· Do not apply heat to the outside of your face. °· Return to your dentist for further treatment as directed. °SEEK MEDICAL CARE IF: °· Your pain is not helped by medicine. °· Your pain is getting worse instead of better. °SEEK IMMEDIATE MEDICAL CARE IF: °· You have a fever or persistent symptoms for more than 2 3 days. °· You have a fever and your symptoms suddenly get worse. °· You have chills or a very bad headache. °· You have problems breathing or swallowing. °· You have trouble  opening your mouth. °· You have swelling in the neck or around the eye. °Document Released: 11/26/2005 Document Revised: 08/20/2012 Document Reviewed: 03/06/2011 °[employer]® Patient Information ©2014 [employer]. ° ° °

## 2013-06-04 ENCOUNTER — Ambulatory Visit (HOSPITAL_BASED_OUTPATIENT_CLINIC_OR_DEPARTMENT_OTHER)
Admission: RE | Admit: 2013-06-04 | Discharge: 2013-06-04 | Disposition: A | Discharge status: Home / Self Care | Payer: Medicare Other | Source: Ambulatory Visit | Attending: Family Medicine | Admitting: Family Medicine

## 2013-06-04 DIAGNOSIS — M542 Cervicalgia: Secondary | ICD-10-CM | POA: Insufficient documentation

## 2013-06-04 DIAGNOSIS — M47812 Spondylosis without myelopathy or radiculopathy, cervical region: Secondary | ICD-10-CM | POA: Insufficient documentation

## 2013-06-04 DIAGNOSIS — M899 Disorder of bone, unspecified: Secondary | ICD-10-CM | POA: Insufficient documentation

## 2013-06-04 DIAGNOSIS — M4000 Postural kyphosis, site unspecified: Secondary | ICD-10-CM | POA: Insufficient documentation

## 2013-06-04 NOTE — Progress Notes (Signed)
Quick Note:  Patients daughter in law informed and voiced understanding ______ 

## 2013-06-04 NOTE — Progress Notes (Signed)
Quick Note:  Patients daughter in law informed and voiced understanding ______

## 2013-06-05 ENCOUNTER — Ambulatory Visit: Payer: Self-pay | Admitting: Family Medicine

## 2013-06-06 ENCOUNTER — Encounter: Payer: Self-pay | Admitting: Family Medicine

## 2013-06-06 DIAGNOSIS — K047 Periapical abscess without sinus: Secondary | ICD-10-CM | POA: Insufficient documentation

## 2013-06-06 HISTORY — DX: Periapical abscess without sinus: K04.7

## 2013-06-06 NOTE — Assessment & Plan Note (Signed)
Patient still frustrated with her congestion, offered reassurance that while it is frustrating it is not serious.

## 2013-06-06 NOTE — Progress Notes (Signed)
Patient ID: [name], female   DOB: [birth year], 79 y.o.   MRN: [id number] Devony Mcgrady [id number] 04-26-1919 06/06/2013      Progress Note-Follow Up  Subjective  Chief Complaint  Chief Complaint  Patient presents with  . swollen cheek    was left side now right side swollen X 2 weeks    HPI  Patient is a 79 year old Caucasian female in today complaining of some neck discomfort and shoulder pain. Most of the right side. No recent falls. No radicular symptoms. Has also been having some dental infections and is seeing a dentist to have some abscesses managed has had some swelling over her right check. No fevers or chills, no chest pain, palpitations, shortness or or GI or GU complaints noted   Past Medical History  Diagnosis Date  . Arthritis   . History of chicken pox   . Depression     husband died 2011-09-19  . Glaucoma   . Thyroid disease   . Hypertension   . Hypokalemia   . Renal disorder   . Altered mental state   . Blind   . Macular degeneration   . Abrasion of arm, left 02/04/2013  . Compression fracture of T12 vertebra 03/21/2013  . Debility 03/21/2013  . Nasal congestion 03/21/2013  . Vasomotor rhinitis 03/21/2013  . Abnormal LFTs 03/21/2013  . Dental abscess 06/06/2013    Past Surgical History  Procedure Laterality Date  . Revision total knee arthroplasty      right knee  . Back surgery    . Cataract extraction, bilateral    . Thyroidectomy    . Abdominal hysterectomy      Family History  Problem Relation Age of Onset  . Hypertension      History   Social History  . Marital Status: Widowed    Spouse Name: N/A    Number of Children: N/A  . Years of Education: N/A   Occupational History  . Not on file.   Social History Main Topics  . Smoking status: Never Smoker   . Smokeless tobacco: Never Used  . Alcohol Use: No  . Drug Use: No  . Sexually Active: No   Other Topics Concern  . Not on file   Social History Narrative  . No narrative on  file    Current Outpatient Prescriptions on File Prior to Visit  Medication Sig Dispense Refill  . amLODipine (NORVASC) 5 MG tablet Take 5 mg by mouth daily.      Marland Kitchen atenolol (TENORMIN) 50 MG tablet Take 50 mg by mouth daily.      Marland Kitchen azelastine (ASTELIN) 137 MCG/SPRAY nasal spray Place 2 sprays into the nose daily. Use in each nostril as directed  30 mL  12  . cetirizine (ZYRTEC) 10 MG tablet Take 1 tablet (10 mg total) by mouth daily.  30 tablet  0  . docusate sodium (COLACE) 100 MG capsule Take 1 capsule (100 mg total) by mouth daily as needed for constipation. Take if no BM at day two despite taking daily 1 Docusate  30 capsule  2  . docusate sodium (COLACE) 100 MG capsule Take 1 capsule (100 mg total) by mouth daily.  30 capsule  3  . fluticasone (FLONASE) 50 MCG/ACT nasal spray Place 2 sprays into the nose daily.      . furosemide (LASIX) 20 MG tablet Take 20 mg by mouth daily.      Marland Kitchen gabapentin (NEURONTIN) 300 MG capsule Take 300 mg by mouth  at bedtime.      Marland Kitchen guaiFENesin (MUCINEX) 600 MG 12 hr tablet Take 1 tablet (600 mg total) by mouth 2 (two) times daily. X 10 days  20 tablet  0  . HYDROcodone-acetaminophen (NORCO/VICODIN) 5-325 MG per tablet Take 0.5 tablets by mouth 2 (two) times daily as needed for pain.  30 tablet  2  . hyoscyamine (LEVSIN SL) 0.125 MG SL tablet Place 1 tablet (0.125 mg total) under the tongue daily as needed for cramping.  30 tablet  0  . levothyroxine (SYNTHROID, LEVOTHROID) 88 MCG tablet Take 88 mcg by mouth daily.       Marland Kitchen loperamide (IMODIUM) 2 MG capsule Take 2 mg by mouth 4 (four) times daily as needed. For diarrhea.      . magnesium oxide (MAG-OX 400) 400 MG tablet Take 1 tablet (400 mg total) by mouth daily.  30 tablet  11  . Multiple Vitamins-Minerals (PRESERVISION AREDS 2 PO) Take 1 tablet by mouth daily.      Marland Kitchen omeprazole (PRILOSEC) 20 MG capsule Take 1 capsule (20 mg total) by mouth daily.  90 capsule  0  . Probiotic Product (MISC INTESTINAL FLORA  REGULAT) CHEW Digestive Advantage gummies 1 tab twice daily  120 tablet  5  . rOPINIRole (REQUIP) 1 MG tablet Take 1-2 tablets (1-2 mg total) by mouth 4 (four) times daily. 1mg  at 2AM; 2mg  at 8AM; 1mg  at [facility]; 2mg  at 8PM  210 tablet  5  . senna (SENOKOT) 8.6 MG TABS Take 1 tablet (8.6 mg total) by mouth daily.  120 each  1  . sertraline (ZOLOFT) 100 MG tablet Take 1 tablet (100 mg total) by mouth daily.  30 tablet  6  . sucralfate (CARAFATE) 1 GM/10ML suspension Take 10 mLs (1 g total) by mouth as directed. Qid w/meals and qhs  420 mL  0  . triamcinolone ointment (KENALOG) 0.1 % Apply topically 2 (two) times daily.  90 g  1  . vitamin B-12 (CYANOCOBALAMIN) 1000 MCG tablet Take 1,000 mcg by mouth daily.       No current facility-administered medications on file prior to visit.    Allergies  Allergen Reactions  . Codeine Other (See Comments)    insomnia  . Doxycycline Diarrhea and Other (See Comments)    Hallucinations  . Fentanyl Hives and Rash  . Lisinopril Nausea And Vomiting, Rash and Hypertension    Review of Systems  Review of Systems  Constitutional: Positive for malaise/fatigue. Negative for fever.  HENT: Positive for congestion.        Mild swelling left cheek  Eyes: Negative for discharge.  Respiratory: Negative for shortness of breath.   Cardiovascular: Negative for chest pain, palpitations and leg swelling.  Gastrointestinal: Negative for nausea, abdominal pain and diarrhea.  Genitourinary: Negative for dysuria.  Musculoskeletal: Negative for falls.  Skin: Negative for rash.  Neurological: Negative for loss of consciousness and headaches.  Endo/Heme/Allergies: Negative for polydipsia.  Psychiatric/Behavioral: Negative for depression and suicidal ideas. The patient is not nervous/anxious and does not have insomnia.     Objective  BP 140/70  Pulse 59  Temp(Src) 98 F (36.7 C) (Oral)  Ht 5\' 1"  (1.549 m)  Wt 123 lb (55.792 kg)  BMI 23.25 kg/m2  SpO2  97%  Physical Exam  Physical Exam  Constitutional: She is oriented to person, place, and time and well-developed, well-nourished, and in no distress. No distress.  frail  HENT:  Head: Normocephalic and atraumatic.  Edema and erythema  surrounding several decayed teeth. Mild swelling over left cheek  Eyes: Conjunctivae are normal.  Neck: Neck supple. No thyromegaly present.  Cardiovascular: Normal rate, regular rhythm and normal heart sounds.   No murmur heard. Pulmonary/Chest: Effort normal and breath sounds normal. She has no wheezes.  Abdominal: She exhibits no distension and no mass.  Musculoskeletal: She exhibits no edema.  kyphosis  Lymphadenopathy:    She has no cervical adenopathy.  Neurological: She is alert and oriented to person, place, and time.  Skin: Skin is warm and dry. No rash noted. She is not diaphoretic.  Psychiatric: Memory, affect and judgment normal.    Lab Results  Component Value Date   TSH 0.848 09/11/2012   Lab Results  Component Value Date   WBC 6.4 01/19/2013   HGB 11.7* 01/19/2013   HCT 33.8* 01/19/2013   MCV 91.7 01/19/2013   PLT 184.0 01/19/2013   Lab Results  Component Value Date   CREATININE 1.0 01/19/2013   BUN 20 01/19/2013   NA 138 01/19/2013   K 3.9 01/19/2013   CL 101 01/19/2013   CO2 29 01/19/2013   Lab Results  Component Value Date   ALT 19 01/19/2013   AST 27 01/19/2013   ALKPHOS 253* 01/19/2013   BILITOT 0.5 01/19/2013    Assessment & Plan  Abnormal LFTs Repeat labs at next visit.   Vasomotor rhinitis Patient still frustrated with her congestion, offered reassurance that while it is frustrating it is not serious.   HTN (hypertension) Well controlled no changes.   Dental abscess Started on antibiotics and probiotics. Has appt with dentist this week.   Compression fracture of T12 vertebra C/o right shoulder pain, xray negative. May try topical treaments such as Salon Pas.

## 2013-06-06 NOTE — Assessment & Plan Note (Signed)
Started on antibiotics and probiotics. Has appt with dentist this week.

## 2013-06-06 NOTE — Assessment & Plan Note (Signed)
Well controlled no changes 

## 2013-06-06 NOTE — Assessment & Plan Note (Signed)
C/o right shoulder pain, xray negative. May try topical treaments such as Salon Pas.

## 2013-06-06 NOTE — Assessment & Plan Note (Signed)
Repeat labs at next visit

## 2013-06-29 ENCOUNTER — Emergency Department (HOSPITAL_COMMUNITY): Payer: Medicare Other

## 2013-06-29 ENCOUNTER — Emergency Department (HOSPITAL_COMMUNITY)
Admission: EM | Admit: 2013-06-29 | Discharge: 2013-06-30 | Disposition: A | Discharge status: Skilled Nursing Facility | Payer: Medicare Other | Attending: Emergency Medicine | Admitting: Emergency Medicine

## 2013-06-29 ENCOUNTER — Encounter (HOSPITAL_COMMUNITY): Payer: Self-pay | Admitting: *Deleted

## 2013-06-29 DIAGNOSIS — I10 Essential (primary) hypertension: Secondary | ICD-10-CM | POA: Insufficient documentation

## 2013-06-29 DIAGNOSIS — Z8669 Personal history of other diseases of the nervous system and sense organs: Secondary | ICD-10-CM | POA: Insufficient documentation

## 2013-06-29 DIAGNOSIS — Z87448 Personal history of other diseases of urinary system: Secondary | ICD-10-CM | POA: Insufficient documentation

## 2013-06-29 DIAGNOSIS — M129 Arthropathy, unspecified: Secondary | ICD-10-CM | POA: Insufficient documentation

## 2013-06-29 DIAGNOSIS — H409 Unspecified glaucoma: Secondary | ICD-10-CM | POA: Insufficient documentation

## 2013-06-29 DIAGNOSIS — Z87828 Personal history of other (healed) physical injury and trauma: Secondary | ICD-10-CM | POA: Insufficient documentation

## 2013-06-29 DIAGNOSIS — Z8781 Personal history of (healed) traumatic fracture: Secondary | ICD-10-CM | POA: Insufficient documentation

## 2013-06-29 DIAGNOSIS — Z8619 Personal history of other infectious and parasitic diseases: Secondary | ICD-10-CM | POA: Insufficient documentation

## 2013-06-29 DIAGNOSIS — F3289 Other specified depressive episodes: Secondary | ICD-10-CM | POA: Insufficient documentation

## 2013-06-29 DIAGNOSIS — F329 Major depressive disorder, single episode, unspecified: Secondary | ICD-10-CM | POA: Insufficient documentation

## 2013-06-29 DIAGNOSIS — Y9210 Unspecified residential institution as the place of occurrence of the external cause: Secondary | ICD-10-CM | POA: Insufficient documentation

## 2013-06-29 DIAGNOSIS — W19XXXA Unspecified fall, initial encounter: Secondary | ICD-10-CM

## 2013-06-29 DIAGNOSIS — W1809XA Striking against other object with subsequent fall, initial encounter: Secondary | ICD-10-CM | POA: Insufficient documentation

## 2013-06-29 DIAGNOSIS — Z8639 Personal history of other endocrine, nutritional and metabolic disease: Secondary | ICD-10-CM | POA: Insufficient documentation

## 2013-06-29 DIAGNOSIS — Z8709 Personal history of other diseases of the respiratory system: Secondary | ICD-10-CM | POA: Insufficient documentation

## 2013-06-29 DIAGNOSIS — H543 Unqualified visual loss, both eyes: Secondary | ICD-10-CM | POA: Insufficient documentation

## 2013-06-29 DIAGNOSIS — F0390 Unspecified dementia without behavioral disturbance: Secondary | ICD-10-CM | POA: Insufficient documentation

## 2013-06-29 DIAGNOSIS — Z79899 Other long term (current) drug therapy: Secondary | ICD-10-CM | POA: Insufficient documentation

## 2013-06-29 DIAGNOSIS — Y9301 Activity, walking, marching and hiking: Secondary | ICD-10-CM | POA: Insufficient documentation

## 2013-06-29 DIAGNOSIS — Z862 Personal history of diseases of the blood and blood-forming organs and certain disorders involving the immune mechanism: Secondary | ICD-10-CM | POA: Insufficient documentation

## 2013-06-29 DIAGNOSIS — S0180XA Unspecified open wound of other part of head, initial encounter: Secondary | ICD-10-CM | POA: Insufficient documentation

## 2013-06-29 DIAGNOSIS — S0181XA Laceration without foreign body of other part of head, initial encounter: Secondary | ICD-10-CM

## 2013-06-29 MED ORDER — HYDROCODONE-ACETAMINOPHEN 5-325 MG PO TABS
1.0000 | ORAL_TABLET | Freq: Once | ORAL | Status: AC
  Administered 2013-06-29: 1 via ORAL
  Filled 2013-06-29: qty 1

## 2013-06-29 MED ORDER — TETANUS-DIPHTH-ACELL PERTUSSIS 5-2.5-18.5 LF-MCG/0.5 IM SUSP: 0.5000 mL | Freq: Once | INTRAMUSCULAR | Status: DC

## 2013-06-29 NOTE — ED Notes (Signed)
Pt was ready for transfer and she stated that she hurt in her right side / rib

## 2013-06-29 NOTE — ED Notes (Signed)
Patient transported to CT 

## 2013-06-29 NOTE — ED Notes (Signed)
Pt from [facility] and she fell approximately 30 minutes striking left side of head with one inch laceration controlled bleeding,  No LOC   Pt states she has neck pain but that is normal for her.

## 2013-06-29 NOTE — ED Notes (Signed)
Report re called to Aurther Loft at [facility]

## 2013-06-29 NOTE — ED Notes (Signed)
WNU:UV25<DG> Expected date:06/29/13<BR> Expected time: 8:11 PM<BR> Means of arrival:Ambulance<BR> Comments:<BR> Fall, head laceration  79 yo F

## 2013-06-29 NOTE — ED Notes (Signed)
Patient transported to X-ray 

## 2013-06-29 NOTE — ED Provider Notes (Signed)
History    CSN: [id number] Arrival date & time 06/29/13  2027  First MD Initiated Contact with Patient 06/29/13 2031     Chief Complaint  Patient presents with  . Fall  . Head Laceration   (Consider location/radiation/quality/duration/timing/severity/associated sxs/prior Treatment) The history is provided by the patient. History limited by: Level V caveat- dementia.  [name] [name] is a 79 y.o. female history of hypertension, dementia here presenting with fall. She was walking at the nursing home and then had a mechanical fall and fell and hit the side of a sink. Denies loss of consciousness or syncope. Denies any chest pain or any extremity pain. She has some neck pain but that is chronic. Tetanus status unknown.   Past Medical History  Diagnosis Date  . Arthritis   . History of chicken pox   . Depression     husband died 09/06/11  . Glaucoma   . Thyroid disease   . Hypertension   . Hypokalemia   . Renal disorder   . Altered mental state   . Blind   . Macular degeneration   . Abrasion of arm, left 02/04/2013  . Compression fracture of T12 vertebra 03/21/2013  . Debility 03/21/2013  . Nasal congestion 03/21/2013  . Vasomotor rhinitis 03/21/2013  . Abnormal LFTs 03/21/2013  . Dental abscess 06/06/2013   Past Surgical History  Procedure Laterality Date  . Revision total knee arthroplasty      right knee  . Back surgery    . Cataract extraction, bilateral    . Thyroidectomy    . Abdominal hysterectomy     Family History  Problem Relation Age of Onset  . Hypertension     History  Substance Use Topics  . Smoking status: Never Smoker   . Smokeless tobacco: Never Used  . Alcohol Use: No   OB History   Grav Para Term Preterm Abortions TAB SAB Ect Mult Living                 Review of Systems  Skin: Positive for wound.  All other systems reviewed and are negative.    Allergies  Codeine; Doxycycline; Fentanyl; and Lisinopril  Home Medications   Current  Outpatient Rx  Name  Route  Sig  Dispense  Refill  . amLODipine (NORVASC) 5 MG tablet   Oral   Take 5 mg by mouth daily.         Marland Kitchen atenolol (TENORMIN) 50 MG tablet   Oral   Take 50 mg by mouth daily.         Marland Kitchen azelastine (ASTELIN) 137 MCG/SPRAY nasal spray   Nasal   Place 2 sprays into the nose daily. Use in each nostril as directed   30 mL   12   . cetirizine (ZYRTEC) 10 MG tablet   Oral   Take 1 tablet (10 mg total) by mouth daily.   30 tablet   0   . docusate sodium (COLACE) 100 MG capsule   Oral   Take 1 capsule (100 mg total) by mouth daily as needed for constipation. Take if no BM at day two despite taking daily 1 Docusate   30 capsule   2   . fluticasone (FLONASE) 50 MCG/ACT nasal spray   Nasal   Place 2 sprays into the nose daily.         . furosemide (LASIX) 20 MG tablet   Oral   Take 20 mg by mouth daily.         Marland Kitchen  gabapentin (NEURONTIN) 300 MG capsule   Oral   Take 300 mg by mouth at bedtime.         Marland Kitchen HYDROcodone-acetaminophen (NORCO/VICODIN) 5-325 MG per tablet   Oral   Take 0.5 tablets by mouth 2 (two) times daily as needed for pain.   30 tablet   2   . hydroxypropyl methylcellulose (ISOPTO TEARS) 2.5 % ophthalmic solution   Both Eyes   Place 1 drop into both eyes 2 (two) times daily.         . hyoscyamine (LEVSIN SL) 0.125 MG SL tablet   Sublingual   Place 1 tablet (0.125 mg total) under the tongue daily as needed for cramping.   30 tablet   0   . loperamide (IMODIUM) 2 MG capsule   Oral   Take 2 mg by mouth 4 (four) times daily as needed. For diarrhea.         . magnesium oxide (MAG-OX) 400 MG tablet   Oral   Take 400 mg by mouth daily.         . Multiple Vitamins-Minerals (PRESERVISION AREDS 2 PO)   Oral   Take 1 tablet by mouth daily.         . Probiotic Product (MISC INTESTINAL FLORA REGULAT) CHEW      Digestive Advantage gummies 1 tab twice daily   120 tablet   5   . rOPINIRole (REQUIP) 1 MG tablet    Oral   Take 2 mg by mouth 2 (two) times daily. TAKE TWO TABLETS (2MG  TOTAL) BY MOUTH TWICE DAILY         . senna (SENOKOT) 8.6 MG TABS   Oral   Take 1 tablet (8.6 mg total) by mouth daily.   120 each   1   . sertraline (ZOLOFT) 100 MG tablet   Oral   Take 1 tablet (100 mg total) by mouth daily.   30 tablet   6   . vitamin B-12 (CYANOCOBALAMIN) 1000 MCG tablet   Oral   Take 1,000 mcg by mouth daily.          BP 187/71  Pulse 66  Temp(Src) 98.7 F (37.1 C) (Oral)  SpO2 97% Physical Exam  Nursing note and vitals reviewed. Constitutional:  Old, slightly demented, comfortable   HENT:  Head: Normocephalic.  Mouth/Throat: Oropharynx is clear and moist.  1 inch laceration L forehead.   Eyes: Conjunctivae are normal. Pupils are equal, round, and reactive to light.  Neck: Normal range of motion. Neck supple.  Cardiovascular: Normal rate, regular rhythm and normal heart sounds.   Pulmonary/Chest: Effort normal and breath sounds normal. No respiratory distress. She has no wheezes. She has no rales.  Abdominal: Soft. Bowel sounds are normal. She exhibits no distension. There is no tenderness. There is no rebound and no guarding.  Musculoskeletal:  Nl ROM bilateral hips and shoulders. No obvious extremity trauma.   Neurological: She is alert.  Demented, moving all extremities.   Skin: Skin is warm and dry.  Skin tear R 5th finger with nl ROM finger. 2+ pulses.   Psychiatric: She has a normal mood and affect. Her behavior is normal. Judgment and thought content normal.    ED Course  Procedures (including critical care time)  LACERATION REPAIR Performed by: Chaney Malling Authorized by: Chaney Malling Consent: Verbal consent obtained. Risks and benefits: risks, benefits and alternatives were discussed Consent given by: patient Patient identity confirmed: provided demographic data Prepped and Draped in normal sterile  fashion Wound explored  Laceration Location: L  forehead  Laceration Length: 3cm  No Foreign Bodies seen or palpated  Anesthesia: none  Irrigation method: syringe Amount of cleaning: standard  Skin closure: dermabond  Patient tolerance: Patient tolerated the procedure well with no immediate complications.     Labs Reviewed - No data to display Ct Head Wo Contrast  06/29/2013   *RADIOLOGY REPORT*  Clinical Data:  Fall with head laceration  CT HEAD WITHOUT CONTRAST CT CERVICAL SPINE WITHOUT CONTRAST  Technique:  Multidetector CT imaging of the head and cervical spine was performed following the standard protocol without intravenous contrast.  Multiplanar CT image reconstructions of the cervical spine were also generated.  Comparison:  Radiograph performed earlier on the same day  CT HEAD  Findings: There is no acute intracranial hemorrhage or infarct.  No extra-axial fluid collection.  Diffuse prominence of the CSF containing spaces was consistent with generalized atrophy. Scattered and confluent hypodensity within the periventricular white matter is consistent with chronic small vessel ischemic changes.  The calvarium is intact.  Soft tissue irregularity at the left frontal scalp was consistent with laceration.  Ciliary body calcifications are noted within the orbits.  The paranasal sinuses are clear. There is partial opacification of the right mastoid air cells.  IMPRESSION: 1.  Atrophy with chronic small vessel ischemic changes.  No acute intracranial process.  2. Left frontal scalp laceration  3. Right mastoid effusion.  CT CERVICAL SPINE  Findings: There is marked accentuation of the normal thoracic kyphosis.  Multilevel degenerative disc disease is seen throughout the visualized cervical spine as evidenced by degenerative intervertebral disc space narrowing, endplate sclerosis, and osteophytosis.  There is no acute fracture.  Grade 1 anterolisthesis of C4-C5 is likely chronic.  There is no prevertebral soft tissue swelling.  The normal  C1-2 articulations are intact.  Prominent atherosclerotic calcifications are noted.  IMPRESSION: 1. No acute fracture or listhesis within the cervical spine 2.  Multilevel degenerative disc disease as above.   Original Report Authenticated By: Rise Mu, M.D.   Ct Cervical Spine Wo Contrast  06/29/2013   *RADIOLOGY REPORT*  Clinical Data:  Fall with head laceration  CT HEAD WITHOUT CONTRAST CT CERVICAL SPINE WITHOUT CONTRAST  Technique:  Multidetector CT imaging of the head and cervical spine was performed following the standard protocol without intravenous contrast.  Multiplanar CT image reconstructions of the cervical spine were also generated.  Comparison:  Radiograph performed earlier on the same day  CT HEAD  Findings: There is no acute intracranial hemorrhage or infarct.  No extra-axial fluid collection.  Diffuse prominence of the CSF containing spaces was consistent with generalized atrophy. Scattered and confluent hypodensity within the periventricular white matter is consistent with chronic small vessel ischemic changes.  The calvarium is intact.  Soft tissue irregularity at the left frontal scalp was consistent with laceration.  Ciliary body calcifications are noted within the orbits.  The paranasal sinuses are clear. There is partial opacification of the right mastoid air cells.  IMPRESSION: 1.  Atrophy with chronic small vessel ischemic changes.  No acute intracranial process.  2. Left frontal scalp laceration  3. Right mastoid effusion.  CT CERVICAL SPINE  Findings: There is marked accentuation of the normal thoracic kyphosis.  Multilevel degenerative disc disease is seen throughout the visualized cervical spine as evidenced by degenerative intervertebral disc space narrowing, endplate sclerosis, and osteophytosis.  There is no acute fracture.  Grade 1 anterolisthesis of C4-C5 is likely chronic.  There  is no prevertebral soft tissue swelling.  The normal C1-2 articulations are intact.   Prominent atherosclerotic calcifications are noted.  IMPRESSION: 1. No acute fracture or listhesis within the cervical spine 2.  Multilevel degenerative disc disease as above.   Original Report Authenticated By: Rise Mu, M.D.   Dg Hand Complete Right  06/29/2013   *RADIOLOGY REPORT*  Clinical Data: Right hand pain after fall.  RIGHT HAND - COMPLETE 3+ VIEW  Comparison: None.  Findings: Diffuse bone demineralization.  Degenerative changes throughout the right hand and wrist.  No acute fracture or subluxation is suggested.  No focal bone lesion or bone destruction.  Trabecular architecture appears intact.  No radiopaque soft tissue foreign bodies.  IMPRESSION: Diffuse degenerative change throughout the right hand and wrist. No acute bony abnormalities appreciated.   Original Report Authenticated By: Burman Nieves, M.D.   No diagnosis found.  MDM  [name] [name] is a 79 y.o. female here with fall. CT head/neck showed no bleed. Tetanus updated. I dermabond the laceration on the face. Stable for d/c back to nursing home.    Richardean Canal, MD 06/29/13 2154

## 2013-06-30 NOTE — ED Notes (Signed)
[facility] called for transport.  

## 2013-07-27 ENCOUNTER — Encounter: Payer: Self-pay | Admitting: Family Medicine

## 2013-07-27 ENCOUNTER — Ambulatory Visit (INDEPENDENT_AMBULATORY_CARE_PROVIDER_SITE_OTHER): Payer: Medicare Other | Admitting: Family Medicine

## 2013-07-27 VITALS — BP 120/64 | HR 59 | Temp 98.0°F | Ht 61.0 in | Wt 125.0 lb

## 2013-07-27 DIAGNOSIS — B370 Candidal stomatitis: Secondary | ICD-10-CM

## 2013-07-27 DIAGNOSIS — K1370 Unspecified lesions of oral mucosa: Secondary | ICD-10-CM

## 2013-07-27 DIAGNOSIS — K1379 Other lesions of oral mucosa: Secondary | ICD-10-CM

## 2013-07-27 DIAGNOSIS — I10 Essential (primary) hypertension: Secondary | ICD-10-CM

## 2013-07-27 DIAGNOSIS — B49 Unspecified mycosis: Secondary | ICD-10-CM

## 2013-07-27 DIAGNOSIS — J300 Vasomotor rhinitis: Secondary | ICD-10-CM

## 2013-07-27 DIAGNOSIS — R634 Abnormal weight loss: Secondary | ICD-10-CM

## 2013-07-27 DIAGNOSIS — J309 Allergic rhinitis, unspecified: Secondary | ICD-10-CM

## 2013-07-27 DIAGNOSIS — R5381 Other malaise: Secondary | ICD-10-CM

## 2013-07-27 MED ORDER — NYSTATIN 100000 UNIT/ML MT SUSP: 500000.0000 [IU] | Freq: Three times a day (TID) | OROMUCOSAL | Status: DC

## 2013-07-27 MED ORDER — BENZOCAINE 10 % MT GEL: OROMUCOSAL | Status: DC | PRN

## 2013-07-27 NOTE — Patient Instructions (Addendum)
Thrush, Adult   Thrush is a yeast infection that develops in the mouth and throat and on the tongue. The medical term for this is oropharyngeal candidiasis, or OPC. Thrush is most common in older adults, but it can occur at any age. Thrush occurs when a yeast called candida grows out of control. Candida normally is present in small amounts in the mouth and on other mucous membranes. However, under certain circumstances, candida can grow rapidly, causing thrush. Thrush can be a recurring problem for people who have chronic illnesses or who take medications that limit the body's ability to fight infection (weakened immune system). Since these people have difficulty fighting infections, the fungus that causes thrush can spread throughout the body. This can cause life-threatening blood or organ infections.  CAUSES   Candida, the yeast that causes thrush, is normally present in small amounts in the mouth and on other mucous membranes. It usually causes no harm. However, when conditions are present that allow the yeast to grow uncontrolled, it invades surrounding tissues and becomes an infection. Thrush is most commonly caused by the yeast Candida albicans. Less often, other forms of candida can lead to thrush.  There are many types of bacteria in your mouth that normally control the growth of candida. Sometimes a new type of bacteria gets into your mouth and disrupts the balance of the germs already there. This can allow candida to overgrow. Other factors that increase your risk of developing thrush include:   An impaired ability to fight infection (weakened immune system). A normal immune system is usually strong enough to prevent candida from overgrowing.   Older adults are more likely to develop thrush because they may have weaker immune systems.   People with human immunodeficiency virus (HIV) infection have a high likelihood of developing thrush. About 90% of people with HIV develop thrush at some point during  the course of their disease.   People with diabetes are more likely to get thrush because high blood sugar levels promote overgrowth of the candida fungus.   A dry mouth (xerostomia). Dry mouth can result from overuse of mouthwashes or from certain conditions such as Sjgren's syndrome.   Pregnancy. Hormone changes during pregnancy can lead to thrush by altering the balance of bacteria in the mouth.   Poor dental care, especially in people who have false teeth.   The use of antibiotic medications. This may lead to thrush by changing the balance of bacteria in the mouth.  SYMPTOMS   Thrush can be a mild infection that causes no symptoms. If symptoms develop, they may include the following:   A burning feeling in the mouth and throat. This can occur at the start of a thrush infection.   White patches that adhere to the mouth and tongue. The tissue around the patches may be red, raw, and painful. If rubbed (during tooth brushing, for example), the patches and the tissue of the mouth may bleed easily.   A bad taste in the mouth or difficulty tasting foods.   Cottony feeling in the mouth.   Sometimes pain during eating and swallowing.  DIAGNOSIS   Your caregiver can usually diagnose thrush by exam. In addition to looking in your mouth, your caregiver will ask you questions about your health.  TREATMENT   Medications that help prevent the growth of fungi (antifungals) are the standard treatment for thrush. These medications are either applied directly to the affected area (topical) or swallowed (oral).  Mild thrush  In   adults, mild cases of thrush may clear up with simple treatment that can be done at home. This treatment usually involves using an antifungal mouth rinse or lozenges. Treatment usually lasts about 14 days.  Moderate to severe thrush   More severe thrush infections that have spread to the esophagus are treated with an oral antifungal medication. A topical antifungal medication may also be  used.   For some severe infections, a treatment period longer than 14 days may be needed.   Oral antifungal medications are almost never used during pregnancy because the fetus may be harmed. However, if a pregnant woman has a rare, severe thrush infection that has spread to her blood, oral antifungal medications may be used. In this case, the risk of harm to the mother and fetus from the severe thrush infection may be greater than the risk posed by the use of antifungal medications.  Persistent or recurrent thrush  Persistent (does not go away) or recurrent (keeps coming back) cases of thrush may:   Need to be treated twice as long as the symptoms last.   Require treatment with both oral and topical antifungal medications.   People with weakened immune systems can take an antifungal medication on a continuous basis to prevent thrush infections.  It is important to treat conditions that make you more likely to get thrush, such as diabetes, human immunodeficiency virus (HIV), or cancer.   HOME CARE INSTRUCTIONS    If you are breast-feeding, you should clean your nipples with an antifungal medication, such as nystatin (Mycostatin). Dry your nipples after breast-feeding. Applying lanolin-containing body lotion may help relieve nipple soreness.   If you wear dentures and get thrush, remove dentures before going to bed, brush them vigorously, and soak in a solution of chlorhexidine gluconate or a product such as Polident or Efferdent.   Eating plain, unflavored yogurt that contains live cultures (check the label) can also help cure thrush. Yogurt helps healthy bacteria grow in the mouth. These bacteria stop the growth of the yeast that causes thrush.   Adults can treat thrush at home with gentian violet (1%), a dye that kills bacteria and fungi. It is available without a prescription. If there is no known cause for the infection or if gentian violet does not cure the thrush, you need to see your  caregiver.  Comfort measures  Measures can be taken to reduce the discomfort of thrush:   Drink cold liquids such as water or iced tea. Eat flavored ice treats or frozen juices.   Eat foods that are easy to swallow such as gelatin, ice cream, or custard.   If the patches are painful, try drinking from a straw.   Rinse your mouth several times a day with a warm saltwater rinse. You can make the saltwater mixture with 1 tsp (5 g) of salt in 8 fl oz (0.2 L) of warm water.  PROGNOSIS    Most cases of thrush are mild and clear up with the use of an antifungal mouth rinse or lozenges. Very mild cases of thrush may clear up without medical treatment. It usually takes about 14 days of treatment with an oral antifungal medication to cure more severe thrush infections. In some cases, thrush may last several weeks even with treatment.   If thrush goes untreated and does not go away by itself, it can spread to other parts of the body.   Thrush can spread to the throat, the vagina, or the skin. It rarely spreads   to other organs of the body.  Thrush is more likely to recur (come back) in:   People who use inhaled corticosteroids to treat asthma.   People who take antibiotic medications for a long time.   People who have false teeth.   People who have a weakened immune system.  RISKS AND COMPLICATIONS  Complications related to thrush are rare in healthy people.  There are several factors that can increase your risk of developing thrush.  Age  Older adults, especially those who have serious health problems, are more likely to develop thrush because their immune systems are likely to be weaker.  Behavior   The yeast that causes thrush can be spread by oral sex.   Heavy smoking can lower the body's ability to fight off infections. This makes thrush more likely to develop.  Other conditions   False teeth (dentures), braces, or a retainer that irritates the mouth make it hard to keep the mouth clean. An unclean mouth is  more likely to develop thrush than a clean mouth.   People with a weakened immune system, such as those who have diabetes or human immunodeficiency virus (HIV) or who are undergoing chemotherapy, have an increased risk for developing thrush.  Medications  Some medications can allow the fungus that causes thrush to grow uncontrolled. Common ones are:   Antibiotics, especially those that kill a wide range of organisms (broad-spectrum antibiotics), such as tetracycline commonly can cause thrush.   Birth control pills (oral contraceptives).   Medications that weaken the body's immune system, such as corticosteroids.  Environment  Exposure over time to certain environmental chemicals, such as benzene and pesticides, can weaken the body's immune system. This increases your risk for developing infections, including thrush.  SEEK IMMEDIATE MEDICAL CARE IF:   Your symptoms are getting worse or are not improving within 7 days of starting treatment.   You have symptoms of spreading infection, such as white patches on the skin outside of the mouth.   You are nursing and you have redness and pain in the nipples in spite of home treatment or if you have burning pain in the nipple area when you nurse. Your baby's mouth should also be examined to determine whether thrush is causing your symptoms.  Document Released: 08/21/2004 Document Revised: 02/18/2012 Document Reviewed: 12/01/2008  [employer] Patient Information 2014 [employer]

## 2013-08-02 ENCOUNTER — Encounter: Payer: Self-pay | Admitting: Family Medicine

## 2013-08-02 DIAGNOSIS — B370 Candidal stomatitis: Secondary | ICD-10-CM

## 2013-08-02 HISTORY — DX: Candidal stomatitis: B37.0

## 2013-08-02 NOTE — Assessment & Plan Note (Signed)
Well controlled no changes today 

## 2013-08-02 NOTE — Assessment & Plan Note (Signed)
Nystatin liquid is prescribed, encouraged probiotics.

## 2013-08-02 NOTE — Progress Notes (Signed)
Patient ID: [name] [name], female   DOB: 19-Oct-1919, 79 y.o.   MRN: [id number] [name] [name] [id number] 1919/12/07 08/02/2013      Progress Note-Follow Up  Subjective  Chief Complaint  Chief Complaint  Patient presents with  . Follow-up    3 month    HPI  Patient is a 79 year old Caucasian female who is in today with her son. She continues to complain of neck pain but it is stable and tolerable. She is frustrated regarding her weakness but is transferring fine. No recent fall or injury. He is complaining that it feels like there's a sore in her mouth but none is seen on exam. She is eating better. Denies chest pain or palpitations. No shortness of breath GI or GU complaints noted  Past Medical History  Diagnosis Date  . Arthritis   . History of chicken pox   . Depression     husband died September 08, 2011  . Glaucoma   . Thyroid disease   . Hypertension   . Hypokalemia   . Renal disorder   . Altered mental state   . Blind   . Macular degeneration   . Abrasion of arm, left 02/04/2013  . Compression fracture of T12 vertebra 03/21/2013  . Debility 03/21/2013  . Nasal congestion 03/21/2013  . Vasomotor rhinitis 03/21/2013  . Abnormal LFTs 03/21/2013  . Dental abscess 06/06/2013  . Thrush 08/02/2013    Past Surgical History  Procedure Laterality Date  . Revision total knee arthroplasty      right knee  . Back surgery    . Cataract extraction, bilateral    . Thyroidectomy    . Abdominal hysterectomy      Family History  Problem Relation Age of Onset  . Hypertension      History   Social History  . Marital Status: Widowed    Spouse Name: N/A    Number of Children: N/A  . Years of Education: N/A   Occupational History  . Not on file.   Social History Main Topics  . Smoking status: Never Smoker   . Smokeless tobacco: Never Used  . Alcohol Use: No  . Drug Use: No  . Sexual Activity: No   Other Topics Concern  . Not on file   Social History Narrative  . No  narrative on file    Current Outpatient Prescriptions on File Prior to Visit  Medication Sig Dispense Refill  . amLODipine (NORVASC) 5 MG tablet Take 5 mg by mouth daily.      Marland Kitchen atenolol (TENORMIN) 50 MG tablet Take 50 mg by mouth daily.      Marland Kitchen azelastine (ASTELIN) 137 MCG/SPRAY nasal spray Place 2 sprays into the nose daily. Use in each nostril as directed  30 mL  12  . cetirizine (ZYRTEC) 10 MG tablet Take 1 tablet (10 mg total) by mouth daily.  30 tablet  0  . docusate sodium (COLACE) 100 MG capsule Take 1 capsule (100 mg total) by mouth daily as needed for constipation. Take if no BM at day two despite taking daily 1 Docusate  30 capsule  2  . fluticasone (FLONASE) 50 MCG/ACT nasal spray Place 2 sprays into the nose daily.      . furosemide (LASIX) 20 MG tablet Take 20 mg by mouth daily.      Marland Kitchen gabapentin (NEURONTIN) 300 MG capsule Take 300 mg by mouth at bedtime.      Marland Kitchen HYDROcodone-acetaminophen (NORCO/VICODIN) 5-325 MG per tablet Take 0.5 tablets by  mouth 2 (two) times daily as needed for pain.  30 tablet  2  . hydroxypropyl methylcellulose (ISOPTO TEARS) 2.5 % ophthalmic solution Place 1 drop into both eyes 2 (two) times daily.      . hyoscyamine (LEVSIN SL) 0.125 MG SL tablet Place 1 tablet (0.125 mg total) under the tongue daily as needed for cramping.  30 tablet  0  . loperamide (IMODIUM) 2 MG capsule Take 2 mg by mouth 4 (four) times daily as needed. For diarrhea.      . magnesium oxide (MAG-OX) 400 MG tablet Take 400 mg by mouth daily.      . Multiple Vitamins-Minerals (PRESERVISION AREDS 2 PO) Take 1 tablet by mouth daily.      . Probiotic Product (MISC INTESTINAL FLORA REGULAT) CHEW Digestive Advantage gummies 1 tab twice daily  120 tablet  5  . rOPINIRole (REQUIP) 1 MG tablet Take 2 mg by mouth 2 (two) times daily. TAKE TWO TABLETS (2MG  TOTAL) BY MOUTH TWICE DAILY      . senna (SENOKOT) 8.6 MG TABS Take 1 tablet (8.6 mg total) by mouth daily.  120 each  1  . sertraline (ZOLOFT)  100 MG tablet Take 1 tablet (100 mg total) by mouth daily.  30 tablet  6  . vitamin B-12 (CYANOCOBALAMIN) 1000 MCG tablet Take 1,000 mcg by mouth daily.       No current facility-administered medications on file prior to visit.    Allergies  Allergen Reactions  . Codeine Other (See Comments)    insomnia  . Doxycycline Diarrhea and Other (See Comments)    Hallucinations  . Fentanyl Hives and Rash  . Lisinopril Nausea And Vomiting, Rash and Hypertension    Review of Systems  Review of Systems  Constitutional: Negative for fever and malaise/fatigue.  HENT: Positive for congestion and neck pain.   Eyes: Negative for discharge.  Respiratory: Negative for shortness of breath.   Cardiovascular: Negative for chest pain, palpitations and leg swelling.  Gastrointestinal: Negative for nausea, abdominal pain and diarrhea.  Genitourinary: Negative for dysuria.  Musculoskeletal: Negative for falls.  Skin: Negative for rash.  Neurological: Positive for weakness. Negative for loss of consciousness and headaches.  Endo/Heme/Allergies: Negative for polydipsia.  Psychiatric/Behavioral: Negative for depression and suicidal ideas. The patient is not nervous/anxious and does not have insomnia.     Objective  BP 120/64  Pulse 59  Temp(Src) 98 F (36.7 C) (Oral)  Ht 5\' 1"  (1.549 m)  Wt 125 lb (56.7 kg)  BMI 23.63 kg/m2  SpO2 97%  Physical Exam  Physical Exam  Constitutional: She is oriented to person, place, and time and well-developed, well-nourished, and in no distress. No distress.  HENT:  Head: Normocephalic and atraumatic.  Dry mouth with white coating on tongue  Eyes: Conjunctivae are normal.  Neck: Neck supple. No thyromegaly present.  Cardiovascular: Normal rate, regular rhythm and normal heart sounds.   No murmur heard. Pulmonary/Chest: Effort normal and breath sounds normal. She has no wheezes.  Abdominal: She exhibits no distension and no mass.  Musculoskeletal: She  exhibits no edema.  Lymphadenopathy:    She has no cervical adenopathy.  Neurological: She is alert and oriented to person, place, and time.  Skin: Skin is warm and dry. No rash noted. She is not diaphoretic.  Psychiatric: Memory, affect and judgment normal.    Lab Results  Component Value Date   TSH 0.848 09/11/2012   Lab Results  Component Value Date   WBC 6.4  01/19/2013   HGB 11.7* 01/19/2013   HCT 33.8* 01/19/2013   MCV 91.7 01/19/2013   PLT 184.0 01/19/2013   Lab Results  Component Value Date   CREATININE 1.0 01/19/2013   BUN 20 01/19/2013   NA 138 01/19/2013   K 3.9 01/19/2013   CL 101 01/19/2013   CO2 29 01/19/2013   Lab Results  Component Value Date   ALT 19 01/19/2013   AST 27 01/19/2013   ALKPHOS 253* 01/19/2013   BILITOT 0.5 01/19/2013     Assessment & Plan  HTN (hypertension) Well controlled no changes today  Vasomotor rhinitis Patient continues to complain but has not responded to treatments. Can consider a referral to ENT if she continues to complain but they are warned there is likely not much that can be done  Thrush Nystatin liquid is prescribed, encouraged probiotics.   Weight loss Good weight gain over past couple of months. Appetite improved  Debility Frustrated with weakness, offered physical therapy but declines.

## 2013-08-02 NOTE — Assessment & Plan Note (Signed)
Patient continues to complain but has not responded to treatments. Can consider a referral to ENT if she continues to complain but they are warned there is likely not much that can be done

## 2013-08-02 NOTE — Assessment & Plan Note (Signed)
Frustrated with weakness, offered physical therapy but declines.

## 2013-08-02 NOTE — Assessment & Plan Note (Signed)
Good weight gain over past couple of months. Appetite improved

## 2013-09-10 ENCOUNTER — Encounter: Payer: Self-pay | Admitting: Family Medicine

## 2013-09-10 ENCOUNTER — Ambulatory Visit (INDEPENDENT_AMBULATORY_CARE_PROVIDER_SITE_OTHER): Payer: Medicare Other | Admitting: Family Medicine

## 2013-09-10 VITALS — BP 102/62 | HR 53 | Temp 98.3°F | Ht 61.0 in

## 2013-09-10 DIAGNOSIS — I10 Essential (primary) hypertension: Secondary | ICD-10-CM

## 2013-09-10 DIAGNOSIS — B370 Candidal stomatitis: Secondary | ICD-10-CM

## 2013-09-10 DIAGNOSIS — F329 Major depressive disorder, single episode, unspecified: Secondary | ICD-10-CM

## 2013-09-10 DIAGNOSIS — F341 Dysthymic disorder: Secondary | ICD-10-CM

## 2013-09-10 DIAGNOSIS — K1379 Other lesions of oral mucosa: Secondary | ICD-10-CM

## 2013-09-10 DIAGNOSIS — K1370 Unspecified lesions of oral mucosa: Secondary | ICD-10-CM

## 2013-09-10 MED ORDER — BENZOCAINE 10 % MT GEL: Freq: Four times a day (QID) | OROMUCOSAL | Status: DC | PRN

## 2013-09-10 MED ORDER — ESCITALOPRAM OXALATE 20 MG PO TABS: 20.0000 mg | ORAL_TABLET | Freq: Every day | ORAL | Status: DC

## 2013-09-10 NOTE — Patient Instructions (Addendum)

## 2013-09-13 ENCOUNTER — Encounter: Payer: Self-pay | Admitting: Family Medicine

## 2013-09-13 NOTE — Progress Notes (Signed)
Patient ID: [name], female   DOB: [birth year], 79 y.o.   MRN: [id number] [name] [name] [id number] [birth year] 09/13/2013      Progress Note-Follow Up  Subjective  Chief Complaint  Chief Complaint  Patient presents with  . mouth sores    HPI  Patient is a 79 year old Caucasian female who is brought today by her son. She is consistently complaining of oral pain. She changes her story somewhat to wear in her mouth her pain is generally that is below her tongue and in her left cheek. We'll try treating dental issues as well as thrush and she continues to complain of pain. She describes the pain is sharp and fleeting never lasting more than it occurs several times a day. She is eating well it has not affected her appetite. There's been no fevers no chills. No chest pain, palpitations or shortness of breath are noted.  Past Medical History  Diagnosis Date  . Arthritis   . History of chicken pox   . Depression     husband died September 19, 2011  . Glaucoma   . Thyroid disease   . Hypertension   . Hypokalemia   . Renal disorder   . Altered mental state   . Blind   . Macular degeneration   . Abrasion of arm, left 02/04/2013  . Compression fracture of T12 vertebra 03/21/2013  . Debility 03/21/2013  . Nasal congestion 03/21/2013  . Vasomotor rhinitis 03/21/2013  . Abnormal LFTs 03/21/2013  . Dental abscess 06/06/2013  . Thrush 08/02/2013    Past Surgical History  Procedure Laterality Date  . Revision total knee arthroplasty      right knee  . Back surgery    . Cataract extraction, bilateral    . Thyroidectomy    . Abdominal hysterectomy      Family History  Problem Relation Age of Onset  . Hypertension      History   Social History  . Marital Status: Widowed    Spouse Name: N/A    Number of Children: N/A  . Years of Education: N/A   Occupational History  . Not on file.   Social History Main Topics  . Smoking status: Never Smoker   . Smokeless tobacco: Never Used  .  Alcohol Use: No  . Drug Use: No  . Sexual Activity: No   Other Topics Concern  . Not on file   Social History Narrative  . No narrative on file    Current Outpatient Prescriptions on File Prior to Visit  Medication Sig Dispense Refill  . amLODipine (NORVASC) 5 MG tablet Take 5 mg by mouth daily.      Marland Kitchen atenolol (TENORMIN) 50 MG tablet Take 50 mg by mouth daily.      Marland Kitchen azelastine (ASTELIN) 137 MCG/SPRAY nasal spray Place 2 sprays into the nose daily. Use in each nostril as directed  30 mL  12  . benzocaine (ORAJEL) 10 % mucosal gel Use as directed in the mouth or throat every 3 (three) hours as needed for pain.  5.3 g  0  . cetirizine (ZYRTEC) 10 MG tablet Take 1 tablet (10 mg total) by mouth daily.  30 tablet  0  . docusate sodium (COLACE) 100 MG capsule Take 1 capsule (100 mg total) by mouth daily as needed for constipation. Take if no BM at day two despite taking daily 1 Docusate  30 capsule  2  . fluticasone (FLONASE) 50 MCG/ACT nasal spray Place 2 sprays into the nose daily.      Marland Kitchen  furosemide (LASIX) 20 MG tablet Take 20 mg by mouth daily.      Marland Kitchen gabapentin (NEURONTIN) 300 MG capsule Take 300 mg by mouth at bedtime.      Marland Kitchen HYDROcodone-acetaminophen (NORCO/VICODIN) 5-325 MG per tablet Take 0.5 tablets by mouth 2 (two) times daily as needed for pain.  30 tablet  2  . hydroxypropyl methylcellulose (ISOPTO TEARS) 2.5 % ophthalmic solution Place 1 drop into both eyes 2 (two) times daily.      . hyoscyamine (LEVSIN SL) 0.125 MG SL tablet Place 1 tablet (0.125 mg total) under the tongue daily as needed for cramping.  30 tablet  0  . loperamide (IMODIUM) 2 MG capsule Take 2 mg by mouth 4 (four) times daily as needed. For diarrhea.      . magnesium oxide (MAG-OX) 400 MG tablet Take 400 mg by mouth daily.      . Multiple Vitamins-Minerals (PRESERVISION AREDS 2 PO) Take 1 tablet by mouth daily.      Marland Kitchen nystatin (MYCOSTATIN) 100000 UNIT/ML suspension Take 5 mL (500,000 Units total) by mouth 3  (three) times daily. X 7 days then as needed after that, swish and spit  240 mL  1  . Probiotic Product (MISC INTESTINAL FLORA REGULAT) CHEW Digestive Advantage gummies 1 tab twice daily  120 tablet  5  . rOPINIRole (REQUIP) 1 MG tablet Take 2 mg by mouth 2 (two) times daily. TAKE TWO TABLETS (2MG  TOTAL) BY MOUTH TWICE DAILY      . senna (SENOKOT) 8.6 MG TABS Take 1 tablet (8.6 mg total) by mouth daily.  120 each  1  . vitamin B-12 (CYANOCOBALAMIN) 1000 MCG tablet Take 1,000 mcg by mouth daily.       No current facility-administered medications on file prior to visit.    Allergies  Allergen Reactions  . Codeine Other (See Comments)    insomnia  . Doxycycline Diarrhea and Other (See Comments)    Hallucinations  . Fentanyl Hives and Rash  . Lisinopril Nausea And Vomiting, Rash and Hypertension    Review of Systems  Review of Systems  Constitutional: Negative for fever and malaise/fatigue.  HENT: Negative for congestion.   Eyes: Negative for discharge.  Respiratory: Negative for shortness of breath.   Cardiovascular: Negative for chest pain, palpitations and leg swelling.  Gastrointestinal: Negative for nausea, abdominal pain and diarrhea.  Genitourinary: Negative for dysuria.  Musculoskeletal: Negative for falls.  Skin: Negative for rash.  Neurological: Negative for loss of consciousness and headaches.  Endo/Heme/Allergies: Negative for polydipsia.  Psychiatric/Behavioral: Negative for depression and suicidal ideas. The patient is not nervous/anxious and does not have insomnia.     Objective  BP 102/62  Pulse 53  Temp(Src) 98.3 F (36.8 C) (Oral)  Ht 5\' 1"  (1.549 m)  SpO2 98%  Physical Exam  Physical Exam  Constitutional: She is oriented to person, place, and time. No distress.  Frail, W female, well developed, kyphotic, no distress.  HENT:  Head: Normocephalic and atraumatic.  Nose: Nose normal.  Mouth/Throat: Oropharynx is clear and moist. No oropharyngeal  exudate.  No oral lesions seen  Eyes: Conjunctivae are normal.  Neck: Neck supple. No thyromegaly present.  Cardiovascular: Normal rate, regular rhythm and normal heart sounds.   No murmur heard. Pulmonary/Chest: Effort normal and breath sounds normal. She has no wheezes.  Abdominal: She exhibits no distension and no mass.  Musculoskeletal: She exhibits no edema.  Lymphadenopathy:    She has no cervical adenopathy.  Neurological: She  is alert and oriented to person, place, and time.  Skin: Skin is warm and dry. No rash noted. She is not diaphoretic.  Psychiatric: Memory, affect and judgment normal.    Lab Results  Component Value Date   TSH 0.848 09/11/2012   Lab Results  Component Value Date   WBC 6.4 01/19/2013   HGB 11.7* 01/19/2013   HCT 33.8* 01/19/2013   MCV 91.7 01/19/2013   PLT 184.0 01/19/2013   Lab Results  Component Value Date   CREATININE 1.0 01/19/2013   BUN 20 01/19/2013   NA 138 01/19/2013   K 3.9 01/19/2013   CL 101 01/19/2013   CO2 29 01/19/2013   Lab Results  Component Value Date   ALT 19 01/19/2013   AST 27 01/19/2013   ALKPHOS 253* 01/19/2013   BILITOT 0.5 01/19/2013     Assessment & Plan  HTN (hypertension) Well controlled, no changes.  Thrush No response to treatment of thrush, patient c/o pain under tongue and in left buccal surface. No obvious lesions and the pain is sharp but fleeting. Happens several times a day per patient. Her son is offered options and chooses to proceed with referral to offer his mother reassurance that there is nothing serious. They are instructed to use Orajel prn

## 2013-09-13 NOTE — Assessment & Plan Note (Signed)
No response to treatment of thrush, patient c/o pain under tongue and in left buccal surface. No obvious lesions and the pain is sharp but fleeting. Happens several times a day per patient. Her son is offered options and chooses to proceed with referral to offer his mother reassurance that there is nothing serious. They are instructed to use Orajel prn

## 2013-09-13 NOTE — Assessment & Plan Note (Signed)
Well controlled, no changes 

## 2013-10-01 IMAGING — CR DG CERVICAL SPINE WITH FLEX & EXTEND
5 series · 5 of 5 positions shown · non-contrast
Comparison: CT scan dated 08/06/2012

CLINICAL DATA: Neck pain.

CERVICAL SPINE COMPLETE WITH FLEXION AND EXTENSION VIEWS

[w c-spine lat *]
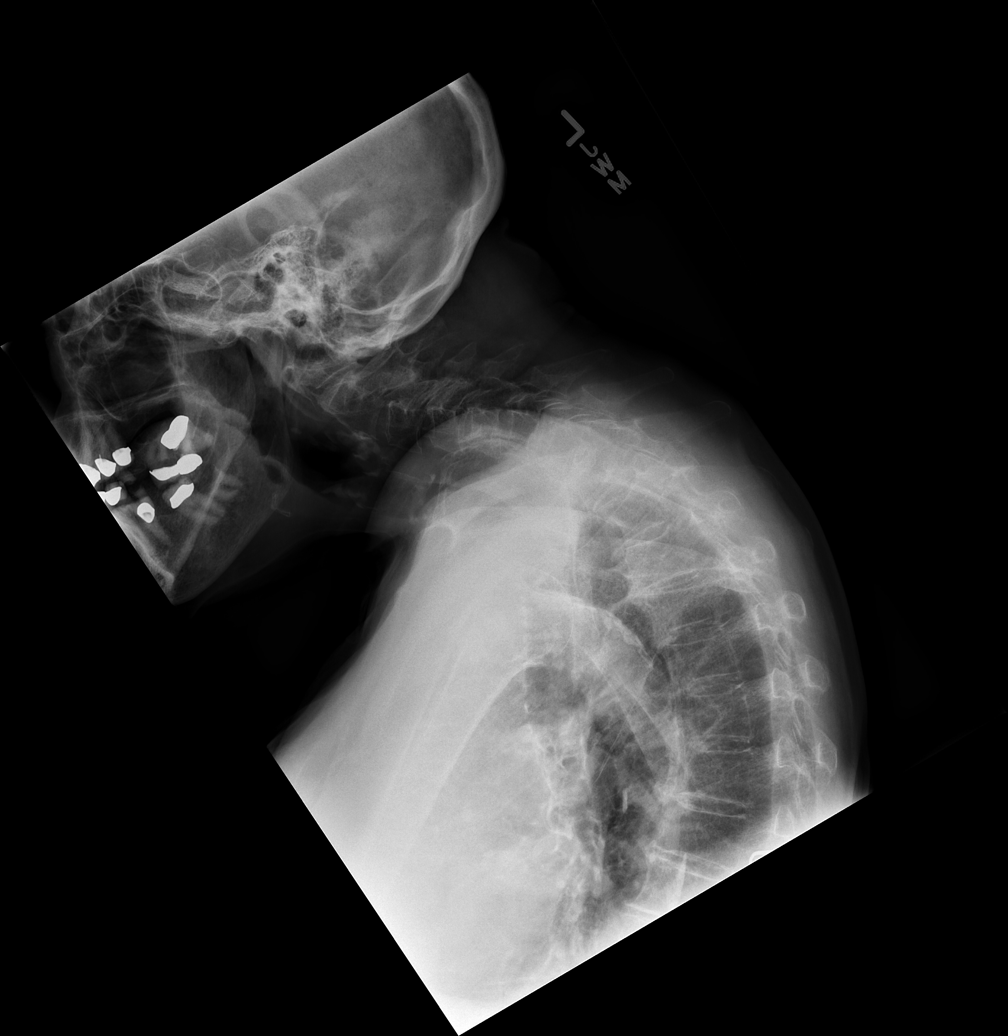

[w c-spine flexion *]
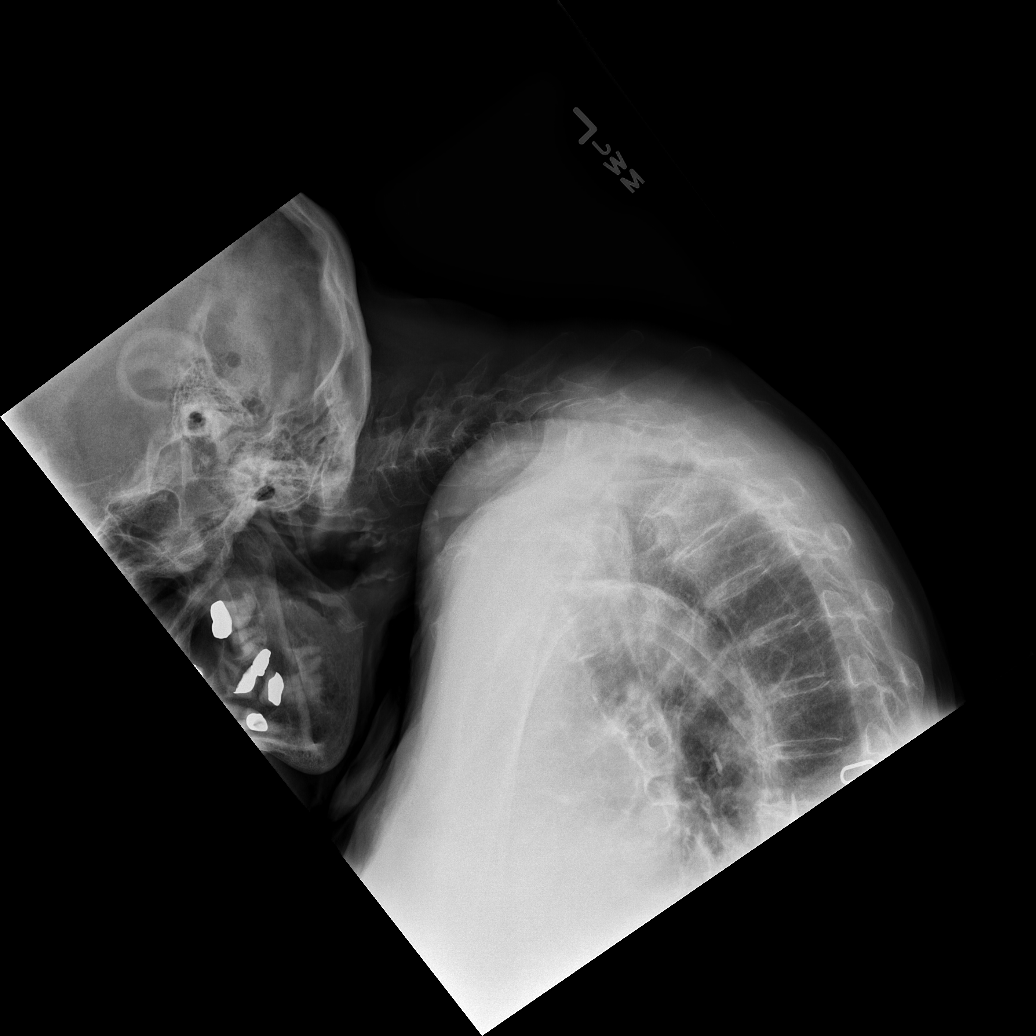

[w c-spine extension *]
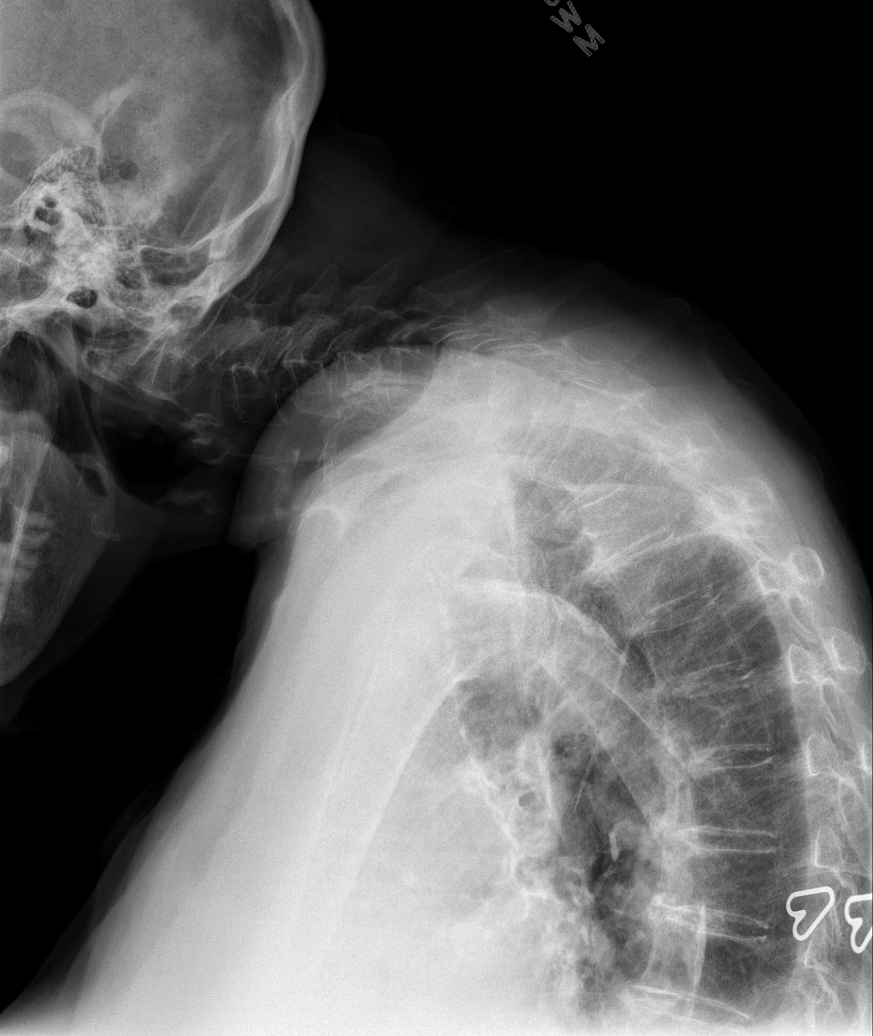

[w c-spine oblique * (1 of 2)]
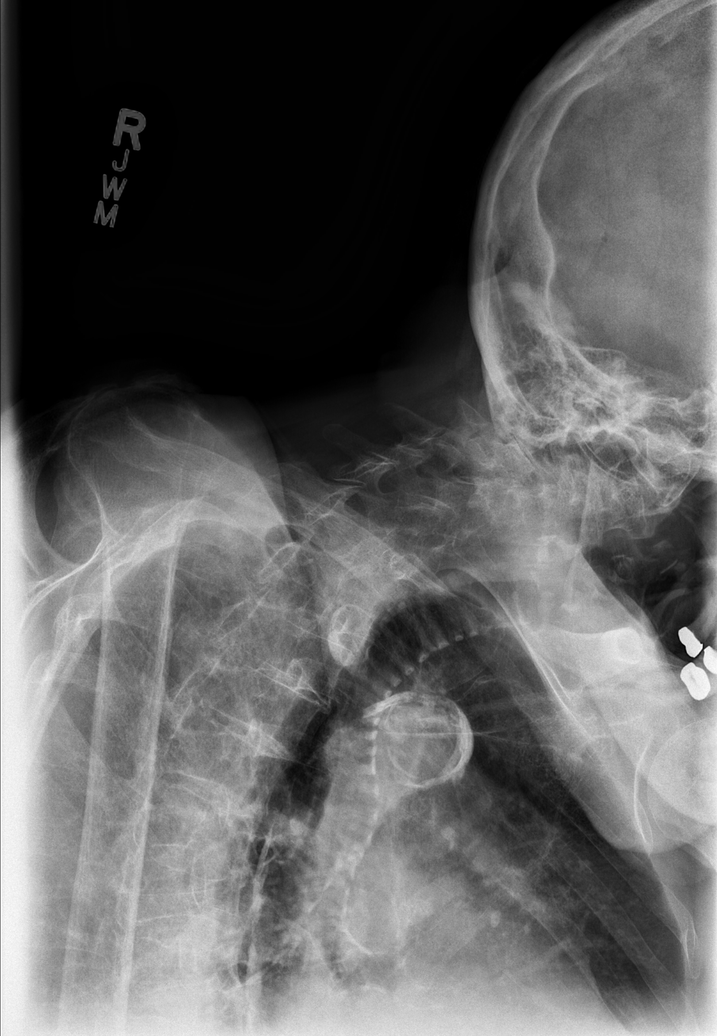

[w c-spine oblique * (2 of 2)]
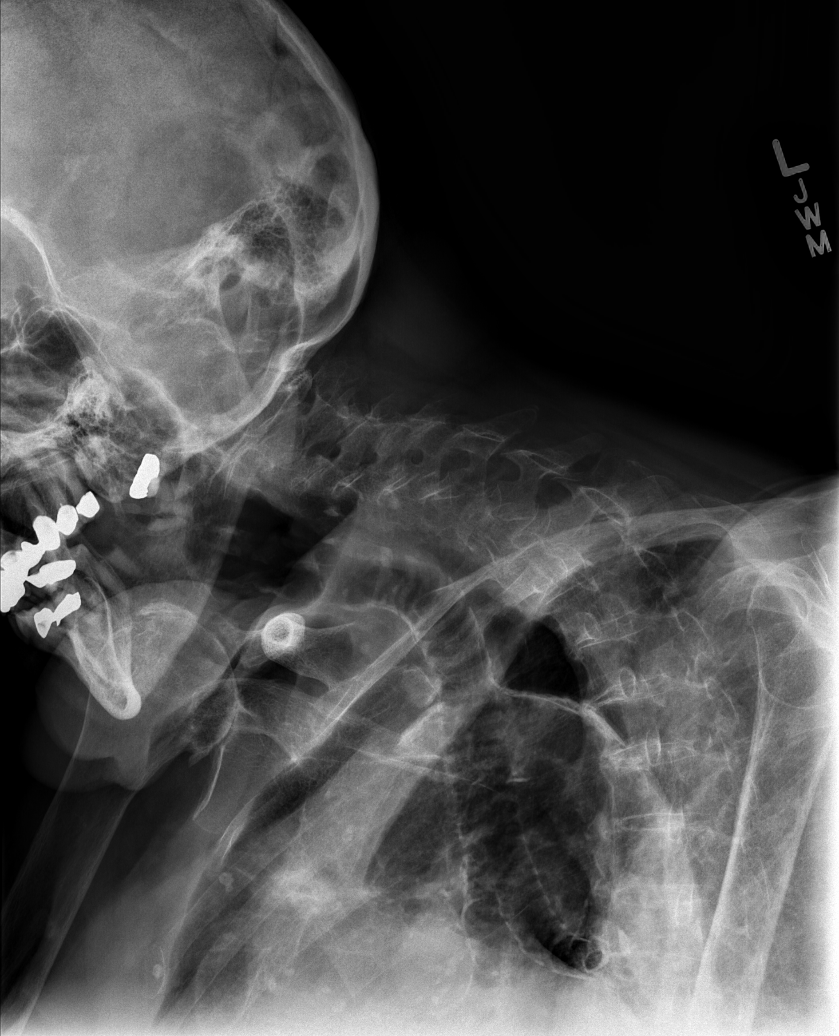

[5 of 5 positions shown; findings below may reference images not displayed]

FINDINGS: There is marked accentuation of the thoracic kyphosis.
There is diffuse osteopenia.  There is no fracture or acute
subluxation.  There is chronic slight anterolisthesis of C3 on C4
and C4 on C5 which is unchanged since the prior CT scan. Facet
arthritis is visible at C2-3 and C3-4 and C4-5.  There is no
foraminal narrowing.  No prevertebral soft tissue swelling.
IMPRESSION: No acute abnormality.  Mild degenerative facet arthritis in the
upper cervical spine, unchanged.  Chronic marked accentuation of
the upper thoracic kyphosis, unchanged.

## 2013-10-09 ENCOUNTER — Encounter: Payer: Self-pay | Admitting: Family Medicine

## 2013-10-09 ENCOUNTER — Ambulatory Visit (INDEPENDENT_AMBULATORY_CARE_PROVIDER_SITE_OTHER): Payer: Medicare Other | Admitting: Family Medicine

## 2013-10-09 VITALS — BP 102/64 | HR 54 | Temp 98.5°F | Ht 61.0 in | Wt 132.1 lb

## 2013-10-09 DIAGNOSIS — IMO0002 Reserved for concepts with insufficient information to code with codable children: Secondary | ICD-10-CM

## 2013-10-09 DIAGNOSIS — F329 Major depressive disorder, single episode, unspecified: Secondary | ICD-10-CM

## 2013-10-09 DIAGNOSIS — J309 Allergic rhinitis, unspecified: Secondary | ICD-10-CM

## 2013-10-09 DIAGNOSIS — F418 Other specified anxiety disorders: Secondary | ICD-10-CM

## 2013-10-09 DIAGNOSIS — L03012 Cellulitis of left finger: Secondary | ICD-10-CM

## 2013-10-09 DIAGNOSIS — I10 Essential (primary) hypertension: Secondary | ICD-10-CM

## 2013-10-09 DIAGNOSIS — J300 Vasomotor rhinitis: Secondary | ICD-10-CM

## 2013-10-09 DIAGNOSIS — K047 Periapical abscess without sinus: Secondary | ICD-10-CM

## 2013-10-09 DIAGNOSIS — F341 Dysthymic disorder: Secondary | ICD-10-CM

## 2013-10-09 MED ORDER — SERTRALINE HCL 50 MG PO TABS: 50.0000 mg | ORAL_TABLET | Freq: Every day | ORAL | Status: DC

## 2013-10-09 MED ORDER — CEPHALEXIN 500 MG PO CAPS: 500.0000 mg | ORAL_CAPSULE | Freq: Three times a day (TID) | ORAL | Status: DC

## 2013-10-09 MED ORDER — MUPIROCIN 2 % EX OINT: TOPICAL_OINTMENT | Freq: Two times a day (BID) | CUTANEOUS | Status: DC

## 2013-10-11 ENCOUNTER — Encounter: Payer: Self-pay | Admitting: Family Medicine

## 2013-10-11 DIAGNOSIS — IMO0002 Reserved for concepts with insufficient information to code with codable children: Secondary | ICD-10-CM

## 2013-10-11 HISTORY — DX: Reserved for concepts with insufficient information to code with codable children: IMO0002

## 2013-10-11 NOTE — Assessment & Plan Note (Addendum)
Overly controlled, d/c amlodipine

## 2013-10-11 NOTE — Assessment & Plan Note (Signed)
Soak in H2O2 bid and started on keflex, nail is so curled may need to be removed.

## 2013-10-11 NOTE — Assessment & Plan Note (Addendum)
Family feels this is worsening. Stop Lexapro and restart Zoloft which they feel worked better

## 2013-10-11 NOTE — Progress Notes (Signed)
Patient ID: [name] [name], female   DOB: 1919-06-05, 79 y.o.   MRN: [id number] [name] [name] [id number] 1919-03-03 10/11/2013      Progress Note-Follow Up  Subjective  Chief Complaint  Chief Complaint  Patient presents with  . Ingrown Toenail    left hand- pointy finger    HPI  Patient is a 79 year old Caucasian female who is in today with her son and Daughter in Social worker. She continues to complain of some started discomfort in her mouth despite having all of her dental work done at an [facility] and [facility] there is no lesion. She is complaining of some pain in her left second finger at the moment. She continues to have clear rhinorrhea despite medications. No fevers or chills. Her family believes her depression is worsening. No chest pain, palpitations, shortness of breath  Past Medical History  Diagnosis Date  . Arthritis   . History of chicken pox   . Depression     husband died Sep 02, 2011  . Glaucoma   . Thyroid disease   . Hypertension   . Hypokalemia   . Renal disorder   . Altered mental state   . Blind   . Macular degeneration   . Abrasion of arm, left 02/04/2013  . Compression fracture of T12 vertebra 03/21/2013  . Debility 03/21/2013  . Nasal congestion 03/21/2013  . Vasomotor rhinitis 03/21/2013  . Abnormal LFTs 03/21/2013  . Dental abscess 06/06/2013  . Thrush 08/02/2013  . Paronychia 10/11/2013    Past Surgical History  Procedure Laterality Date  . Revision total knee arthroplasty      right knee  . Back surgery    . Cataract extraction, bilateral    . Thyroidectomy    . Abdominal hysterectomy      Family History  Problem Relation Age of Onset  . Hypertension      History   Social History  . Marital Status: Widowed    Spouse Name: N/A    Number of Children: N/A  . Years of Education: N/A   Occupational History  . Not on file.   Social History Main Topics  . Smoking status: Never Smoker   . Smokeless tobacco: Never Used  . Alcohol Use: No  . Drug  Use: No  . Sexual Activity: No   Other Topics Concern  . Not on file   Social History Narrative  . No narrative on file    Current Outpatient Prescriptions on File Prior to Visit  Medication Sig Dispense Refill  . atenolol (TENORMIN) 50 MG tablet Take 50 mg by mouth daily.      Marland Kitchen azelastine (ASTELIN) 137 MCG/SPRAY nasal spray Place 2 sprays into the nose daily. Use in each nostril as directed  30 mL  12  . cetirizine (ZYRTEC) 10 MG tablet Take 1 tablet (10 mg total) by mouth daily.  30 tablet  0  . docusate sodium (COLACE) 100 MG capsule Take 1 capsule (100 mg total) by mouth daily as needed for constipation. Take if no BM at day two despite taking daily 1 Docusate  30 capsule  2  . furosemide (LASIX) 20 MG tablet Take 20 mg by mouth daily.      Marland Kitchen gabapentin (NEURONTIN) 300 MG capsule Take 300 mg by mouth at bedtime.      Marland Kitchen HYDROcodone-acetaminophen (NORCO/VICODIN) 5-325 MG per tablet Take 0.5 tablets by mouth 2 (two) times daily as needed for pain.  30 tablet  2  . hydroxypropyl methylcellulose (ISOPTO TEARS) 2.5 %  ophthalmic solution Place 1 drop into both eyes 2 (two) times daily.      . hyoscyamine (LEVSIN SL) 0.125 MG SL tablet Place 1 tablet (0.125 mg total) under the tongue daily as needed for cramping.  30 tablet  0  . loperamide (IMODIUM) 2 MG capsule Take 2 mg by mouth 4 (four) times daily as needed. For diarrhea.      . magnesium oxide (MAG-OX) 400 MG tablet Take 400 mg by mouth daily.      . Multiple Vitamins-Minerals (PRESERVISION AREDS 2 PO) Take 1 tablet by mouth daily.      Marland Kitchen nystatin (MYCOSTATIN) 100000 UNIT/ML suspension Take 5 mL (500,000 Units total) by mouth 3 (three) times daily. X 7 days then as needed after that, swish and spit  240 mL  1  . Probiotic Product (MISC INTESTINAL FLORA REGULAT) CHEW Digestive Advantage gummies 1 tab twice daily  120 tablet  5  . rOPINIRole (REQUIP) 1 MG tablet Take 2 mg by mouth 2 (two) times daily. TAKE TWO TABLETS (2MG  TOTAL) BY MOUTH  TWICE DAILY      . senna (SENOKOT) 8.6 MG TABS Take 1 tablet (8.6 mg total) by mouth daily.  120 each  1  . vitamin B-12 (CYANOCOBALAMIN) 1000 MCG tablet Take 1,000 mcg by mouth daily.       No current facility-administered medications on file prior to visit.    Allergies  Allergen Reactions  . Codeine Other (See Comments)    insomnia  . Doxycycline Diarrhea and Other (See Comments)    Hallucinations  . Fentanyl Hives and Rash  . Lisinopril Nausea And Vomiting, Rash and Hypertension    Review of Systems  Review of Systems  Constitutional: Positive for malaise/fatigue. Negative for fever.  HENT: Positive for congestion.   Eyes: Negative for discharge.  Respiratory: Negative for shortness of breath.   Cardiovascular: Negative for chest pain, palpitations and leg swelling.  Gastrointestinal: Positive for constipation. Negative for nausea, abdominal pain and diarrhea.  Genitourinary: Negative for dysuria.  Musculoskeletal: Negative for falls.  Skin: Negative for rash.  Neurological: Negative for loss of consciousness and headaches.  Endo/Heme/Allergies: Negative for polydipsia.  Psychiatric/Behavioral: Positive for depression. Negative for suicidal ideas. The patient is nervous/anxious. The patient does not have insomnia.     Objective  BP 102/64  Pulse 54  Temp(Src) 98.5 F (36.9 C) (Oral)  Ht 5\' 1"  (1.549 m)  Wt 132 lb 1.3 oz (59.911 kg)  BMI 24.97 kg/m2  SpO2 96%  Physical Exam  Physical Exam  Constitutional: She is oriented to person, place, and time and well-developed, well-nourished, and in no distress. No distress.  HENT:  Head: Normocephalic and atraumatic.  Eyes: Conjunctivae are normal.  Neck: Neck supple. No thyromegaly present.  Cardiovascular: Normal rate, regular rhythm and normal heart sounds.   No murmur heard. Pulmonary/Chest: Effort normal and breath sounds normal. She has no wheezes.  Abdominal: She exhibits no distension and no mass.   Musculoskeletal: She exhibits no edema.  Lymphadenopathy:    She has no cervical adenopathy.  Neurological: She is alert and oriented to person, place, and time.  Skin: Skin is warm and dry. No rash noted. She is not diaphoretic.  Left second finger, red and swollen around the nail bed. Curled nail  Psychiatric: Memory, affect and judgment normal.    Lab Results  Component Value Date   TSH 0.848 09/11/2012   Lab Results  Component Value Date   WBC 6.4 01/19/2013  HGB 11.7* 01/19/2013   HCT 33.8* 01/19/2013   MCV 91.7 01/19/2013   PLT 184.0 01/19/2013   Lab Results  Component Value Date   CREATININE 1.0 01/19/2013   BUN 20 01/19/2013   NA 138 01/19/2013   K 3.9 01/19/2013   CL 101 01/19/2013   CO2 29 01/19/2013   Lab Results  Component Value Date   ALT 19 01/19/2013   AST 27 01/19/2013   ALKPHOS 253* 01/19/2013   BILITOT 0.5 01/19/2013     Assessment & Plan  HTN (hypertension) Overly controlled, d/c amlodipine  Dental abscess Still c/o mouth pain? Despite dental work and consultation with oral surgeon, no obvious lesion today  Vasomotor rhinitis They are going to stop Flonase to see if her rhinitis worsens  Paronychia Soak in H2O2 bid and started on keflex, nail is so curled may need to be removed.  Depression with anxiety Family feels this is worsening. Stop Lexapro and restart Zoloft which they feel worked better

## 2013-10-11 NOTE — Assessment & Plan Note (Signed)
Still c/o mouth pain? Despite dental work and consultation with oral surgeon, no obvious lesion today

## 2013-10-11 NOTE — Assessment & Plan Note (Signed)
They are going to stop Flonase to see if her rhinitis worsens

## 2013-10-12 ENCOUNTER — Ambulatory Visit: Payer: Self-pay | Admitting: Family Medicine

## 2013-10-19 ENCOUNTER — Telehealth: Payer: Self-pay | Admitting: Family Medicine

## 2013-10-19 NOTE — Telephone Encounter (Signed)
Please Advise

## 2013-10-19 NOTE — Telephone Encounter (Signed)
Patient son states that patient's ingrown toe nail is not getting any better. He would like Dr. Abner Greenspan to refer her out for this.

## 2013-10-20 ENCOUNTER — Telehealth: Payer: Self-pay | Admitting: *Deleted

## 2013-10-20 ENCOUNTER — Other Ambulatory Visit: Payer: Self-pay | Admitting: Family Medicine

## 2013-10-20 DIAGNOSIS — L600 Ingrowing nail: Secondary | ICD-10-CM

## 2013-10-20 MED ORDER — HYDROCODONE-ACETAMINOPHEN 5-325 MG PO TABS: 0.5000 | ORAL_TABLET | Freq: Two times a day (BID) | ORAL | Status: DC | PRN

## 2013-10-20 NOTE — Telephone Encounter (Signed)
Faxed refill request received from [facility] pharmacy for Hydrocodone-APAP Last filled by MD on 04.22.14, #30x2 Last AEX - 10.31.14 Next AEX - 4-wks Per provider, Rx printed for #60x0 d/t change in drug class status [must be picked up & signed out now] for convenience to pt & family members  LMOM with contact name and number for return call with or son and/or daughter-in-law RE: Rx and as to who would be picking up the prescription for Hydrocodone, as it's drug class has changed/SLS

## 2013-10-21 NOTE — Telephone Encounter (Signed)
Spoke with daughter-in-law and advised that someone will need to pick up Rx to take to [facility] due to new classification of hydrocodone (can no longer be faxed or called to pharmacy). Rx placed at front desk for pick up.

## 2013-11-20 ENCOUNTER — Telehealth: Payer: Self-pay

## 2013-11-20 MED ORDER — HYDROCODONE-ACETAMINOPHEN 5-325 MG PO TABS: 0.5000 | ORAL_TABLET | Freq: Two times a day (BID) | ORAL | Status: DC | PRN

## 2013-11-20 NOTE — Telephone Encounter (Signed)
Fax sent to [facility] stating RX is at front desk and someone needs to come and sign for it

## 2013-12-15 ENCOUNTER — Telehealth: Payer: Self-pay | Admitting: Family Medicine

## 2013-12-15 NOTE — Telephone Encounter (Signed)
[facility] called stating that patient is out of hydrocodone. I explained to her that we could no longer fax this rx, that some one will have to pickup this prescription.  Prescription dated for 11.11.14 was never picked up. I put fax in shredder.

## 2014-01-12 ENCOUNTER — Encounter: Payer: Self-pay | Admitting: Family Medicine

## 2014-01-12 ENCOUNTER — Ambulatory Visit (INDEPENDENT_AMBULATORY_CARE_PROVIDER_SITE_OTHER): Payer: Medicare Other | Admitting: Family Medicine

## 2014-01-12 VITALS — BP 112/64 | HR 54 | Temp 97.7°F | Ht 61.0 in | Wt 135.0 lb

## 2014-01-12 DIAGNOSIS — I10 Essential (primary) hypertension: Secondary | ICD-10-CM

## 2014-01-12 DIAGNOSIS — M19079 Primary osteoarthritis, unspecified ankle and foot: Secondary | ICD-10-CM

## 2014-01-12 DIAGNOSIS — E039 Hypothyroidism, unspecified: Secondary | ICD-10-CM

## 2014-01-12 DIAGNOSIS — G2581 Restless legs syndrome: Secondary | ICD-10-CM

## 2014-01-12 DIAGNOSIS — D649 Anemia, unspecified: Secondary | ICD-10-CM

## 2014-01-12 DIAGNOSIS — R252 Cramp and spasm: Secondary | ICD-10-CM

## 2014-01-12 DIAGNOSIS — Z79899 Other long term (current) drug therapy: Secondary | ICD-10-CM

## 2014-01-12 DIAGNOSIS — R945 Abnormal results of liver function studies: Secondary | ICD-10-CM

## 2014-01-12 DIAGNOSIS — K047 Periapical abscess without sinus: Secondary | ICD-10-CM

## 2014-01-12 DIAGNOSIS — A0472 Enterocolitis due to Clostridium difficile, not specified as recurrent: Secondary | ICD-10-CM

## 2014-01-12 DIAGNOSIS — R7989 Other specified abnormal findings of blood chemistry: Secondary | ICD-10-CM

## 2014-01-12 HISTORY — DX: Primary osteoarthritis, unspecified ankle and foot: M19.079

## 2014-01-12 HISTORY — DX: Restless legs syndrome: G25.81

## 2014-01-12 LAB — RENAL FUNCTION PANEL
ALBUMIN: 3.4 g/dL — AB (ref 3.5–5.2)
BUN: 20 mg/dL (ref 6–23)
CALCIUM: 9.2 mg/dL (ref 8.4–10.5)
CO2: 29 meq/L (ref 19–32)
Chloride: 104 mEq/L (ref 96–112)
Creat: 0.99 mg/dL (ref 0.50–1.10)
GLUCOSE: 76 mg/dL (ref 70–99)
Phosphorus: 3.7 mg/dL (ref 2.3–4.6)
Potassium: 3.9 mEq/L (ref 3.5–5.3)
Sodium: 141 mEq/L (ref 135–145)

## 2014-01-12 LAB — HEPATIC FUNCTION PANEL
ALT: 9 U/L (ref 0–35)
AST: 14 U/L (ref 0–37)
Albumin: 3.4 g/dL — ABNORMAL LOW (ref 3.5–5.2)
Alkaline Phosphatase: 134 U/L — ABNORMAL HIGH (ref 39–117)
Bilirubin, Direct: <0.1 mg/dL (ref 0.0–0.3)
TOTAL PROTEIN: 7 g/dL (ref 6.0–8.3)
Total Bilirubin: 0.4 mg/dL (ref 0.2–1.2)

## 2014-01-12 LAB — CBC
HCT: 36.2 % (ref 36.0–46.0)
Hemoglobin: 12.2 g/dL (ref 12.0–15.0)
MCH: 31 pg (ref 26.0–34.0)
MCHC: 33.7 g/dL (ref 30.0–36.0)
MCV: 92.1 fL (ref 78.0–100.0)
Platelets: 217 10*3/uL (ref 150–400)
RBC: 3.93 MIL/uL (ref 3.87–5.11)
RDW: 14.6 % (ref 11.5–15.5)
WBC: 5.1 10*3/uL (ref 4.0–10.5)

## 2014-01-12 LAB — MAGNESIUM: MAGNESIUM: 2.2 mg/dL (ref 1.5–2.5)

## 2014-01-12 MED ORDER — ROPINIROLE HCL 1 MG PO TABS: 1.0000 mg | ORAL_TABLET | Freq: Two times a day (BID) | ORAL | Status: DC

## 2014-01-12 NOTE — Assessment & Plan Note (Signed)
Check hepatic panel today

## 2014-01-12 NOTE — Assessment & Plan Note (Signed)
Just seen by dentist no concerns identified, unfortunately she continues to complain about a soreness in her mouth and being able to pull something out of her mouth between her teeth, in the visit she tries to show [place] something but nothing is visible. Her son confirms he has never seen anything. May try gargling with mouth wash or salt water, will check some labs but no other concerns on exam

## 2014-01-12 NOTE — Assessment & Plan Note (Signed)
Well controlled, no changes 

## 2014-01-12 NOTE — Assessment & Plan Note (Signed)
C/o stiffness. Encouraged warm soaks, activity as tolerated and massage with Aspercreme

## 2014-01-12 NOTE — Progress Notes (Signed)
Patient ID: [name] [name], female   DOB: [birth year], 80 y.o.   MRN: [id number] [name] [name] [id number] Dec 26, 1918 01/12/2014      Progress Note-Follow Up  Subjective  Chief Complaint  Chief Complaint  Patient presents with  . Follow-up    HPI  Patient is a 80 year old female who is in today accompanied by her son. She continues to complain of a sensation of something being between her teeth and being able to expel something at times. Does not complain so much pain today. Son reports no new concerns. No chest pain, palpitations or shortness of breath. No GI or GU concerns. Patient's other main complaint is of stiffness in bilateral ankles and bilateral toes. Minimal discomfort and no swelling or warmth is noted.  Past Medical History  Diagnosis Date  . Arthritis   . History of chicken pox   . Depression     husband died 08-16-11  . Glaucoma   . Thyroid disease   . Hypertension   . Hypokalemia   . Renal disorder   . Altered mental state   . Blind   . Macular degeneration   . Abrasion of arm, left 02/04/2013  . Compression fracture of T12 vertebra 03/21/2013  . Debility 03/21/2013  . Nasal congestion 03/21/2013  . Vasomotor rhinitis 03/21/2013  . Abnormal LFTs 03/21/2013  . Dental abscess 06/06/2013  . Thrush 08/02/2013  . Paronychia 10/11/2013    Past Surgical History  Procedure Laterality Date  . Revision total knee arthroplasty      right knee  . Back surgery    . Cataract extraction, bilateral    . Thyroidectomy    . Abdominal hysterectomy      Family History  Problem Relation Age of Onset  . Hypertension      History   Social History  . Marital Status: Widowed    Spouse Name: N/A    Number of Children: N/A  . Years of Education: N/A   Occupational History  . Not on file.   Social History Main Topics  . Smoking status: Never Smoker   . Smokeless tobacco: Never Used  . Alcohol Use: No  . Drug Use: No  . Sexual Activity: No   Other Topics Concern  .  Not on file   Social History Narrative  . No narrative on file    Current Outpatient Prescriptions on File Prior to Visit  Medication Sig Dispense Refill  . atenolol (TENORMIN) 50 MG tablet Take 50 mg by mouth daily.      Marland Kitchen azelastine (ASTELIN) 137 MCG/SPRAY nasal spray Place 2 sprays into the nose daily. Use in each nostril as directed  30 mL  12  . cetirizine (ZYRTEC) 10 MG tablet Take 1 tablet (10 mg total) by mouth daily.  30 tablet  0  . docusate sodium (COLACE) 100 MG capsule Take 1 capsule (100 mg total) by mouth daily as needed for constipation. Take if no BM at day two despite taking daily 1 Docusate  30 capsule  2  . furosemide (LASIX) 20 MG tablet Take 20 mg by mouth daily.      Marland Kitchen gabapentin (NEURONTIN) 300 MG capsule Take 300 mg by mouth at bedtime.      Marland Kitchen HYDROcodone-acetaminophen (NORCO/VICODIN) 5-325 MG per tablet Take 0.5 tablets by mouth 2 (two) times daily as needed.  60 tablet  0  . hydroxypropyl methylcellulose (ISOPTO TEARS) 2.5 % ophthalmic solution Place 1 drop into both eyes 2 (two) times daily.      Marland Kitchen  hyoscyamine (LEVSIN SL) 0.125 MG SL tablet Place 1 tablet (0.125 mg total) under the tongue daily as needed for cramping.  30 tablet  0  . loperamide (IMODIUM) 2 MG capsule Take 2 mg by mouth 4 (four) times daily as needed. For diarrhea.      . magnesium oxide (MAG-OX) 400 MG tablet Take 400 mg by mouth daily.      . Multiple Vitamins-Minerals (PRESERVISION AREDS 2 PO) Take 1 tablet by mouth daily.      . mupirocin ointment (BACTROBAN) 2 % Apply topically 2 (two) times daily. X 7 days to affected finger on left hand  22 g  0  . nystatin (MYCOSTATIN) 100000 UNIT/ML suspension Take 5 mL (500,000 Units total) by mouth 3 (three) times daily. X 7 days then as needed after that, swish and spit  240 mL  1  . Probiotic Product (MISC INTESTINAL FLORA REGULAT) CHEW Digestive Advantage gummies 1 tab twice daily  120 tablet  5  . rOPINIRole (REQUIP) 1 MG tablet Take 2 mg by mouth 2  (two) times daily. TAKE TWO TABLETS (2MG  TOTAL) BY MOUTH TWICE DAILY      . senna (SENOKOT) 8.6 MG TABS Take 1 tablet (8.6 mg total) by mouth daily.  120 each  1  . sertraline (ZOLOFT) 50 MG tablet Take 1 tablet (50 mg total) by mouth daily.  30 tablet  3  . vitamin B-12 (CYANOCOBALAMIN) 1000 MCG tablet Take 1,000 mcg by mouth daily.       No current facility-administered medications on file prior to visit.    Allergies  Allergen Reactions  . Codeine Other (See Comments)    insomnia  . Doxycycline Diarrhea and Other (See Comments)    Hallucinations  . Fentanyl Hives and Rash  . Lisinopril Nausea And Vomiting, Rash and Hypertension    Review of Systems  Review of Systems  Constitutional: Negative for fever and malaise/fatigue.  HENT: Negative for congestion.   Eyes: Negative for discharge.  Respiratory: Negative for shortness of breath.   Cardiovascular: Negative for chest pain, palpitations and leg swelling.  Gastrointestinal: Negative for nausea, abdominal pain and diarrhea.  Genitourinary: Negative for dysuria.  Musculoskeletal: Positive for joint pain. Negative for falls.       B/l ankles feel stiff as do toes. Right hip feels unstable, she always uses a walker.   Skin: Negative for rash.  Neurological: Positive for tremors. Negative for seizures, loss of consciousness and headaches.  Endo/Heme/Allergies: Negative for polydipsia.  Psychiatric/Behavioral: Negative for depression and suicidal ideas. The patient is not nervous/anxious and does not have insomnia.     Objective  BP 112/64  Pulse 54  Temp(Src) 97.7 F (36.5 C) (Oral)  Ht 5\' 1"  (1.549 m)  Wt 135 lb 0.6 oz (61.254 kg)  BMI 25.53 kg/m2  SpO2 94%  Physical Exam  Physical Exam  Constitutional: She is oriented to person, place, and time and well-developed, well-nourished, and in no distress. No distress.  HENT:  Head: Normocephalic and atraumatic.  No oral lesions   Eyes: Conjunctivae are normal.  Neck:  Neck supple. No thyromegaly present.  Cardiovascular: Normal rate and regular rhythm.   Murmur heard. Pulmonary/Chest: Effort normal and breath sounds normal. She has no wheezes.  Abdominal: She exhibits no distension and no mass.  Musculoskeletal: She exhibits no edema.  Lymphadenopathy:    She has no cervical adenopathy.  Neurological: She is alert and oriented to person, place, and time.  Skin: Skin is warm  and dry. No rash noted. She is not diaphoretic.  Psychiatric: Memory, affect and judgment normal.    Lab Results  Component Value Date   TSH 0.848 09/11/2012   Lab Results  Component Value Date   WBC 6.4 01/19/2013   HGB 11.7* 01/19/2013   HCT 33.8* 01/19/2013   MCV 91.7 01/19/2013   PLT 184.0 01/19/2013   Lab Results  Component Value Date   CREATININE 1.0 01/19/2013   BUN 20 01/19/2013   NA 138 01/19/2013   K 3.9 01/19/2013   CL 101 01/19/2013   CO2 29 01/19/2013   Lab Results  Component Value Date   ALT 19 01/19/2013   AST 27 01/19/2013   ALKPHOS 253* 01/19/2013   BILITOT 0.5 01/19/2013     Assessment & Plan  HTN (hypertension) Well controlled, no changes  Hypothyroidism Check tsh today  C. difficile diarrhea resolved  Dental abscess Just seen by dentist no concerns identified, unfortunately she continues to complain about a soreness in her mouth and being able to pull something out of her mouth between her teeth, in the visit she tries to show [place] something but nothing is visible. Her son confirms he has never seen anything. May try gargling with mouth wash or salt water, will check some labs but no other concerns on exam  Abnormal LFTs Check hepatic panel today

## 2014-01-12 NOTE — Assessment & Plan Note (Signed)
Patient notes increased symptoms when her facility misses a dose, she asks for higher strength but she is already on a high dose. Would not increase

## 2014-01-12 NOTE — Progress Notes (Signed)
Pre visit review using our clinic review tool, if applicable. No additional management support is needed unless otherwise documented below in the visit note. 

## 2014-01-12 NOTE — Assessment & Plan Note (Signed)
Check tsh today 

## 2014-01-12 NOTE — Assessment & Plan Note (Signed)
resolved 

## 2014-01-12 NOTE — Patient Instructions (Addendum)
Foot bath or massage feet with Aspercreme, Icy Hot or Salon Pas For mouth gargle with mouthwash or salt water 3-5 x a day   Osteoarthritis Osteoarthritis is a disease that causes soreness and swelling (inflammation) of a joint. It occurs when the cartilage at the affected joint wears down. Cartilage acts as a cushion, covering the ends of bones where they meet to form a joint. Osteoarthritis is the most common form of arthritis. It often occurs in older people. The joints affected most often by this condition include those in the:  Ends of the fingers.  Thumbs.  Neck.  Lower back.  Knees.  Hips. CAUSES  Over time, the cartilage that covers the ends of bones begins to wear away. This causes bone to rub on bone, producing pain and stiffness in the affected joints.  RISK FACTORS Certain factors can increase your chances of having osteoarthritis, including:  Older age.  Excessive body weight.  Overuse of joints. SIGNS AND SYMPTOMS   Pain, swelling, and stiffness in the joint.  Over time, the joint may lose its normal shape.  Small deposits of bone (osteophytes) may grow on the edges of the joint.  Bits of bone or cartilage can break off and float inside the joint space. This may cause more pain and damage. DIAGNOSIS  Your health care provider will do a physical exam and ask about your symptoms. Various tests may be ordered, such as:  X-rays of the affected joint.  An MRI scan.  Blood tests to rule out other types of arthritis.  Joint fluid tests. This involves using a needle to draw fluid from the joint and examining the fluid under a microscope. TREATMENT  Goals of treatment are to control pain and improve joint function. Treatment plans may include:  A prescribed exercise program that allows for rest and joint relief.  A weight control plan.  Pain relief techniques, such as:  Properly applied heat and cold.  Electric pulses delivered to nerve endings under the  skin (transcutaneous electrical nerve stimulation, TENS).  Massage.  Certain nutritional supplements.  Medicines to control pain, such as:  Acetaminophen.  Nonsteroidal anti-inflammatory drugs (NSAIDs), such as naproxen.  Narcotic or central-acting agents, such as tramadol.  Corticosteroids. These can be given orally or as an injection.  Surgery to reposition the bones and relieve pain (osteotomy) or to remove loose pieces of bone and cartilage. Joint replacement may be needed in advanced states of osteoarthritis. HOME CARE INSTRUCTIONS   Only take over-the-counter or prescription medicines as directed by your health care provider. Take all medicines exactly as instructed.  Maintain a healthy weight. Follow your health care provider's instructions for weight control. This may include dietary instructions.  Exercise as directed. Your health care provider can recommend specific types of exercise. These may include:  Strengthening exercises These are done to strengthen the muscles that support joints affected by arthritis. They can be performed with weights or with exercise bands to add resistance.  Aerobic activities These are exercises, such as brisk walking or low-impact aerobics, that get your heart pumping.  Range-of-motion activities These keep your joints limber.  Balance and agility exercises These help you maintain daily living skills.  Rest your affected joints as directed by your health care provider.  Follow up with your health care provider as directed. SEEK MEDICAL CARE IF:   Your skin turns red.  You develop a rash in addition to your joint pain.  You have worsening joint pain. [place]  CARE IF:  You have a significant loss of weight or appetite.  You have a fever along with joint or muscle aches.  You have night sweats. [place] of Arthritis and Musculoskeletal and Skin Diseases: www.niams.SouthExposed.es [employer] on Aging: http://kim-miller.com/ American College of Rheumatology: www.rheumatology.org Document Released: 11/26/2005 Document Revised: 09/16/2013 Document Reviewed: 08/03/2013 [facility] Patient Information 2014 [place], [place].

## 2014-01-13 ENCOUNTER — Telehealth: Payer: Self-pay | Admitting: *Deleted

## 2014-01-13 ENCOUNTER — Telehealth: Payer: Self-pay | Admitting: Family Medicine

## 2014-01-13 DIAGNOSIS — E559 Vitamin D deficiency, unspecified: Secondary | ICD-10-CM

## 2014-01-13 LAB — VITAMIN D 25 HYDROXY (VIT D DEFICIENCY, FRACTURES): Vit D, 25-Hydroxy: 12 ng/mL — ABNORMAL LOW (ref 30–89)

## 2014-01-13 LAB — VITAMIN B12: VITAMIN B 12: 1261 pg/mL — AB (ref 211–911)

## 2014-01-13 LAB — TSH: TSH: 0.266 u[IU]/mL — ABNORMAL LOW (ref 0.350–4.500)

## 2014-01-13 NOTE — Telephone Encounter (Signed)
Relevant patient education mailed to patient.  

## 2014-01-13 NOTE — Telephone Encounter (Deleted)
Notified Bev of below results. She states pt still complains of sensation in her mouth that feels like pens and needles in her mouth. Wants to know what else should be checked or done for this? States pt has been to the dentist and cleared for oral causes. So far, all blood work has been unrevealing and family states that pt doesn't complain unless she is really having pain. Family is wanting answer or cause for pt's symptoms and are prepared to have any additional testing needed.

## 2014-01-13 NOTE — Telephone Encounter (Signed)
Nothing else I can check. I do not have an adequate explanation and I discussed this at length with her son yesterday. Nothing obvious on exam and the patient complained more of a substance in her mouth which was not visible. Any med that might treat a neuropathic pain (ie tingling) has too much risk to a patient her age. Sorry

## 2014-01-13 NOTE — Telephone Encounter (Signed)
I have no further thoughts on labs and oral surgeon was unable to find a cause. They are welcome to see another PCP for a second opinion.

## 2014-01-13 NOTE — Telephone Encounter (Signed)
Message copied by Ronny Flurry on Wed Jan 13, 2014  4:20 PM ------      Message from: [place], [place]: Wed Jan 13, 2014  1:48 PM       Notify anemia gone, vitamin B12 high but OK, mag normal, vitamin d is low 50000 IU tabs 1 tab po q wk x 12 weeks and recheck in 12 weeks ------

## 2014-01-13 NOTE — Telephone Encounter (Signed)
Notified Bev (daughter in law) of below results. She states pt still complains of sensation that feels like pens and needles in her mouth. Wants to know what else should be checked or done for this? States pt has been to the dentist and cleared for oral causes. So far, all blood work has been unrevealing and family states that pt doesn't complain unless she is really having pain. Family is wanting answer or cause for pt's symptoms and are prepared to have any additional testing needed. She is aware that Provider is out of the office x 1 week and will await response.  Please advise?   Verbal order called to Guerry Minors at [facility] and faxed to them at [phone]. Attached copy of lab results per Loretta's request. Lab order entered for April 29.

## 2014-01-15 NOTE — Telephone Encounter (Signed)
Left detailed message on voicemail and to call if any questions. 

## 2014-01-20 ENCOUNTER — Telehealth: Payer: Self-pay

## 2014-01-20 MED ORDER — VITAMIN D (ERGOCALCIFEROL) 1.25 MG (50000 UNIT) PO CAPS: 50000.0000 [IU] | ORAL_CAPSULE | ORAL | Status: DC

## 2014-01-20 NOTE — Telephone Encounter (Signed)
I sent RX for Vitamin D to [facility]

## 2014-02-16 ENCOUNTER — Telehealth: Payer: Self-pay

## 2014-02-16 ENCOUNTER — Other Ambulatory Visit: Payer: Self-pay | Admitting: Family Medicine

## 2014-02-16 DIAGNOSIS — R918 Other nonspecific abnormal finding of lung field: Secondary | ICD-10-CM

## 2014-02-16 NOTE — Telephone Encounter (Signed)
We received paperwork from [facility] stating pt had a Xray and has a Right sided lesion in lung. They are recommending a CT to be done the family wants. Dr Charlett Blake will put the referral in.  Left a message for patients daughter to return my call

## 2014-02-16 NOTE — Telephone Encounter (Signed)
I informed patients son and daughter in law. Patients son would like to proceed with the CT scan.

## 2014-02-18 ENCOUNTER — Ambulatory Visit (HOSPITAL_BASED_OUTPATIENT_CLINIC_OR_DEPARTMENT_OTHER): Payer: Medicare Other

## 2014-02-19 ENCOUNTER — Ambulatory Visit (HOSPITAL_BASED_OUTPATIENT_CLINIC_OR_DEPARTMENT_OTHER)
Admission: RE | Admit: 2014-02-19 | Discharge: 2014-02-19 | Disposition: A | Discharge status: Home / Self Care | Payer: Medicare Other | Source: Ambulatory Visit | Attending: Family Medicine | Admitting: Family Medicine

## 2014-02-19 DIAGNOSIS — I728 Aneurysm of other specified arteries: Secondary | ICD-10-CM | POA: Insufficient documentation

## 2014-02-19 DIAGNOSIS — I517 Cardiomegaly: Secondary | ICD-10-CM | POA: Insufficient documentation

## 2014-02-19 DIAGNOSIS — I700 Atherosclerosis of aorta: Secondary | ICD-10-CM | POA: Insufficient documentation

## 2014-02-19 DIAGNOSIS — J984 Other disorders of lung: Secondary | ICD-10-CM | POA: Insufficient documentation

## 2014-02-19 DIAGNOSIS — I77819 Aortic ectasia, unspecified site: Secondary | ICD-10-CM | POA: Insufficient documentation

## 2014-02-19 DIAGNOSIS — R918 Other nonspecific abnormal finding of lung field: Secondary | ICD-10-CM

## 2014-02-23 ENCOUNTER — Telehealth: Payer: Self-pay | Admitting: Family Medicine

## 2014-02-23 NOTE — Telephone Encounter (Signed)
pts son informed.

## 2014-02-23 NOTE — Telephone Encounter (Signed)
would like ct results and what the next step is

## 2014-03-02 ENCOUNTER — Telehealth: Payer: Self-pay | Admitting: Family Medicine

## 2014-03-02 ENCOUNTER — Other Ambulatory Visit: Payer: Self-pay | Admitting: Family Medicine

## 2014-03-02 DIAGNOSIS — R202 Paresthesia of skin: Secondary | ICD-10-CM

## 2014-03-02 DIAGNOSIS — G629 Polyneuropathy, unspecified: Secondary | ICD-10-CM

## 2014-03-02 NOTE — Telephone Encounter (Signed)
Wants referral to neurology as Becci feels like she has pins and needles in her mouth

## 2014-03-15 ENCOUNTER — Ambulatory Visit: Payer: Self-pay | Admitting: Family Medicine

## 2014-03-15 DIAGNOSIS — Z0289 Encounter for other administrative examinations: Secondary | ICD-10-CM

## 2014-04-20 ENCOUNTER — Encounter: Payer: Self-pay | Admitting: Neurology

## 2014-04-20 ENCOUNTER — Ambulatory Visit (INDEPENDENT_AMBULATORY_CARE_PROVIDER_SITE_OTHER): Payer: Medicare Other | Admitting: Neurology

## 2014-04-20 VITALS — BP 104/58 | HR 60 | Temp 98.0°F

## 2014-04-20 DIAGNOSIS — R269 Unspecified abnormalities of gait and mobility: Secondary | ICD-10-CM

## 2014-04-20 DIAGNOSIS — R441 Visual hallucinations: Secondary | ICD-10-CM

## 2014-04-20 DIAGNOSIS — H5316 Psychophysical visual disturbances: Secondary | ICD-10-CM

## 2014-04-20 NOTE — Progress Notes (Signed)
[facility] Clinic Note - Initial Visit   Date: 04/20/2014    [name] [name] MRN: [id number] DOB: [birth year]   Dear Dr Randel Pigg:  Thank you for your kind referral of [name] [name] for consultation of oral paresthesias. Although her history is well known to you, please allow [place] to reiterate it for the purpose of our medical record. The patient was accompanied to the clinic by son, daughter, and daughter-in-law who also provides collateral information.     History of Present Illness: [name] [name] is a 80 y.o. right-handed [place] female with history of severe macular degeneration (legally blind), deafness, RLS, osteoarthritis, and GERD presenting for evaluation of abnormal sensation of her mouth.    Since late 2014, she reports complaining of needles in her mouth and "gummy" sensation of the mouth.  She feels as if there are things between her teeth, there is no associated numbness/tingling of abnormal sensation of the tongue, palate, or inner cheek.  Symptoms are worse when she tries to clean her teeth, especially after eating, worse on there right side. Denies any alleviating symptoms.  She had an episode in the office at which times she tried to pull something out of her teeth and even tells her children "look, there it is, do you see it?".  There was nothing present.  She has seen her PCP, dentist, and oral surgeon with non-diagnostic evaluation.    Memory has been deteriorating lately, especially with long-term memory.  She has been in assisted living for two years.  She endorses vivid dreams. Denies any significant change in taste.  Out-side paper records, electronic medical record, and images have been reviewed where available and summarized as:  Lab Results  Component Value Date   TSH 0.266* 01/12/2014   Lab Results  Component Value Date   VITAMINB12 1261* 01/12/2014   CT head wo contrast 06/29/2013: 1. Atrophy with chronic small vessel ischemic  changes. No acute intracranial process.  2. Left frontal scalp laceration  3. Right mastoid effusion.  CT cervical spine wo contrast 06/29/2013: 1. No acute fracture or listhesis within the cervical spine  2. Multilevel degenerative disc disease as above.    Past Medical History  Diagnosis Date  . Arthritis   . History of chicken pox   . Depression     husband died 01-Sep-2011  . Glaucoma   . Thyroid disease   . Hypertension   . Hypokalemia   . Renal disorder   . Altered mental state   . Blind   . Macular degeneration   . Abrasion of arm, left 02/04/2013  . Compression fracture of T12 vertebra 03/21/2013  . Debility 03/21/2013  . Nasal congestion 03/21/2013  . Vasomotor rhinitis 03/21/2013  . Abnormal LFTs 03/21/2013  . Dental abscess 06/06/2013  . Thrush 08/02/2013  . Paronychia 10/11/2013  . Osteoarthritis of ankle and foot 01/12/2014  . RLS (restless legs syndrome) 01/12/2014    Past Surgical History  Procedure Laterality Date  . Revision total knee arthroplasty      right knee  . Back surgery    . Cataract extraction, bilateral    . Thyroidectomy    . Abdominal hysterectomy       Medications:  Current Outpatient Prescriptions on File Prior to Visit  Medication Sig Dispense Refill  . atenolol (TENORMIN) 50 MG tablet Take 50 mg by mouth daily.      Marland Kitchen azelastine (ASTELIN) 137 MCG/SPRAY nasal spray Place 2 sprays into the nose daily. Use in each  nostril as directed  30 mL  12  . cetirizine (ZYRTEC) 10 MG tablet Take 1 tablet (10 mg total) by mouth daily.  30 tablet  0  . docusate sodium (COLACE) 100 MG capsule Take 1 capsule (100 mg total) by mouth daily as needed for constipation. Take if no BM at day two despite taking daily 1 Docusate  30 capsule  2  . furosemide (LASIX) 20 MG tablet Take 20 mg by mouth daily.      Marland Kitchen gabapentin (NEURONTIN) 300 MG capsule Take 300 mg by mouth at bedtime.      Marland Kitchen HYDROcodone-acetaminophen (NORCO/VICODIN) 5-325 MG per tablet Take 0.5 tablets by  mouth 2 (two) times daily as needed.  60 tablet  0  . hydroxypropyl methylcellulose (ISOPTO TEARS) 2.5 % ophthalmic solution Place 1 drop into both eyes 2 (two) times daily.      . hyoscyamine (LEVSIN SL) 0.125 MG SL tablet Place 1 tablet (0.125 mg total) under the tongue daily as needed for cramping.  30 tablet  0  . loperamide (IMODIUM) 2 MG capsule Take 2 mg by mouth 4 (four) times daily as needed. For diarrhea.      . magnesium oxide (MAG-OX) 400 MG tablet Take 400 mg by mouth daily.      . Multiple Vitamins-Minerals (PRESERVISION AREDS 2 PO) Take 1 tablet by mouth daily.      . mupirocin ointment (BACTROBAN) 2 % Apply topically 2 (two) times daily. X 7 days to affected finger on left hand  22 g  0  . nystatin (MYCOSTATIN) 100000 UNIT/ML suspension Take 5 mL (500,000 Units total) by mouth 3 (three) times daily. X 7 days then as needed after that, swish and spit  240 mL  1  . Probiotic Product (MISC INTESTINAL FLORA REGULAT) CHEW Digestive Advantage gummies 1 tab twice daily  120 tablet  5  . rOPINIRole (REQUIP) 1 MG tablet Take 2 mg by mouth 2 (two) times daily. TAKE TWO TABLETS (2MG  TOTAL) BY MOUTH TWICE DAILY      . rOPINIRole (REQUIP) 1 MG tablet Take 1 tablet (1 mg total) by mouth 2 (two) times daily. Alternates with 2 mg dose bid      . senna (SENOKOT) 8.6 MG TABS Take 1 tablet (8.6 mg total) by mouth daily.  120 each  1  . sertraline (ZOLOFT) 50 MG tablet Take 1 tablet (50 mg total) by mouth daily.  30 tablet  3  . vitamin B-12 (CYANOCOBALAMIN) 1000 MCG tablet Take 1,000 mcg by mouth daily.      . Vitamin D, Ergocalciferol, (DRISDOL) 50000 UNITS CAPS capsule Take 1 capsule (50,000 Units total) by mouth every 7 (seven) days. X 12 weeks, then recheck Vitamin D level in 12 weeks  12 capsule  0   No current facility-administered medications on file prior to visit.    Allergies:  Allergies  Allergen Reactions  . Codeine Other (See Comments)    insomnia  . Doxycycline Diarrhea and Other  (See Comments)    Hallucinations  . Fentanyl Hives and Rash  . Lisinopril Nausea And Vomiting, Rash and Hypertension    Family History: Family History  Problem Relation Age of Onset  . Other Father     Killed in war  . Other Mother     Deceased, 36  . Breast cancer Daughter   . Hypertension Son   . Hyperlipidemia Son     Social History: History   Social History  . Marital Status:  Widowed    Spouse Name: N/A    Number of Children: N/A  . Years of Education: N/A   Occupational History  . Not on file.   Social History Main Topics  . Smoking status: Never Smoker   . Smokeless tobacco: Never Used  . Alcohol Use: No  . Drug Use: No  . Sexual Activity: No   Other Topics Concern  . Not on file   Social History Narrative   She is retired from working in a [employer].   Originally from [place] and immigrated from [place] in 1950   She was living in [place] until 2012 with her husband, until he passed away and then moved here.    Review of Systems:  CONSTITUTIONAL: No fevers, chills, night sweats, or weight loss.   EYES: No visual changes or eye pain ENT: No hearing changes.  No history of nose bleeds.   RESPIRATORY: No cough, wheezing and shortness of breath.   CARDIOVASCULAR: Negative for chest pain, and palpitations.   GI: Negative for abdominal discomfort, blood in stools or black stools.  No recent change in bowel habits.   GU:  No history of incontinence.   MUSCLOSKELETAL: +history of joint pain or swelling.  No myalgias.   SKIN: Negative for lesions, rash, and itching.   HEMATOLOGY/ONCOLOGY: Negative for prolonged bleeding, bruising easily, and swollen nodes.   ENDOCRINE: Negative for cold or heat intolerance, polydipsia or goiter.   PSYCH:  +depression or anxiety symptoms.   NEURO: As Above.   Vital Signs:  BP 104/58  Pulse 60  Temp(Src) 98 F (36.7 C)  SpO2 92%     General:  She is sitting in wheel chair with pronounced anterocollis, apprears comfortable.      Neurological Exam: MENTAL STATUS including orientation to time, place, person, recent and remote memory, attention span and concentration, language, and fund of knowledge is fair.  Year is 2013, month is May, does not say name of president.  She is actively hallucinating in the office regarding seeing and feeling things in her mouth.     CRANIAL NERVES: II:  No visual field defects.   III-IV-VI: Pupils equal round and reactive to light.  Upward gaze is limited bilaterally, otherwise normal conjugate, extra-ocular eye movements in all directions of gaze.  No nystagmus.  No ptosis.   V:  Normal facial sensation.  Sensation of cheek and tongue intact to tongue depressor. VII:  Normal facial symmetry and movements.  VIII:  Normal hearing and vestibular function.   IX-X:  Normal palatal movement.   XI:  Normal shoulder shrug and head rotation.   XII:  Normal tongue strength and range of motion, no deviation or fasciculation.  MOTOR:  Motor strength is 5-/5 in all extremities.  There is mild intrinsic hand atrophy.  No fasciculations or abnormal movements.  No pronator drift.  Tone is normal.    MSRs:  Right                                                                 Left brachioradialis 2+  brachioradialis 2+  biceps 2+  biceps 2+  triceps 2+  triceps 2+  patellar tr  Patellar tr  ankle jerk 0  ankle jerk 0  Hoffman no  Hoffman no   SENSORY:  Vibration is absent distal to ankles bilaterally.  Otherwise, normal and symmetric perception of light touch, pinprick, vibration, and proprioception.    COORDINATION/GAIT:finger tapping is slowed bilaterally and reduced in amplitude.   Did not test gait as patient predominately wheelchair-bound   IMPRESSION/PLAN: [name] [name] is a 80 year-old female presenting for evaluation of oral dysesthesias.  Based on her exam and history, she most likely is having tactile and visual hallucinations.  Given her age, I am hesitant to start medications, but  her family say that she is very bothered by it, almost fixated, so we can try very low dose seroquel 12.5mg  and see how she tolerates it.  Requip can cause hallucinations and I discussed reducing the dose, but her RLS seems to be quite severe and family would prefer not to make any medication changes.  I have instructed them to try to reorient her during these spells or offer other distracting maneuvers (ice, mint) to see if this helps.  She has good motor strength and family also surprised by her motor strength and requested PT referral to keep her mobilized. I explained that she is a fall risk because of her anterocollis, but they would like to see if PT will help.  Return to clinic as needed.  The duration of this appointment visit was 50 minutes of face-to-face time with the patient.  Greater than 50% of this time was spent in counseling, explanation of diagnosis, planning of further management, and coordination of care.   Thank you for allowing me to participate in patient's care.  If I can answer any additional questions, I would be pleased to do so.    Sincerely,    Kerrington Greenhalgh K. Posey Pronto, DO

## 2014-04-20 NOTE — Patient Instructions (Addendum)
1.  Start seroquel 25mg  one-half tablet (12.5mg ) daily.  If she does not tolerate, stop medication. 2.  Start physical therapy for gait training and balance 3.  Return to clinic as needed

## 2014-04-21 ENCOUNTER — Encounter: Payer: Self-pay | Admitting: *Deleted

## 2014-05-20 ENCOUNTER — Encounter: Payer: Self-pay | Admitting: Podiatry

## 2014-05-20 ENCOUNTER — Ambulatory Visit (INDEPENDENT_AMBULATORY_CARE_PROVIDER_SITE_OTHER): Payer: Medicare Other | Admitting: Podiatry

## 2014-05-20 DIAGNOSIS — B351 Tinea unguium: Secondary | ICD-10-CM

## 2014-05-20 DIAGNOSIS — M79609 Pain in unspecified limb: Secondary | ICD-10-CM

## 2014-05-21 NOTE — Progress Notes (Signed)
She presents today with a chief complaint of painful elongated toenails.  Objective: Nails are thick yellow dystrophic onychomycotic and painful palpation.  Assessment: Pain in limb secondary to onychomycosis 1 through 5 bilateral.  Plan: Debridement of nails 1 through 5 bilateral covered service secondary to pain.

## 2014-06-10 ENCOUNTER — Encounter (HOSPITAL_COMMUNITY): Payer: Self-pay | Admitting: Emergency Medicine

## 2014-06-10 ENCOUNTER — Emergency Department (HOSPITAL_COMMUNITY): Payer: Medicare Other

## 2014-06-10 ENCOUNTER — Emergency Department (HOSPITAL_COMMUNITY)
Admission: EM | Admit: 2014-06-10 | Discharge: 2014-06-10 | Disposition: A | Discharge status: Home / Self Care | Payer: Medicare Other | Attending: Emergency Medicine | Admitting: Emergency Medicine

## 2014-06-10 DIAGNOSIS — Z8619 Personal history of other infectious and parasitic diseases: Secondary | ICD-10-CM | POA: Insufficient documentation

## 2014-06-10 DIAGNOSIS — I10 Essential (primary) hypertension: Secondary | ICD-10-CM | POA: Insufficient documentation

## 2014-06-10 DIAGNOSIS — F329 Major depressive disorder, single episode, unspecified: Secondary | ICD-10-CM | POA: Insufficient documentation

## 2014-06-10 DIAGNOSIS — Y9389 Activity, other specified: Secondary | ICD-10-CM | POA: Insufficient documentation

## 2014-06-10 DIAGNOSIS — Z79899 Other long term (current) drug therapy: Secondary | ICD-10-CM | POA: Insufficient documentation

## 2014-06-10 DIAGNOSIS — S93409A Sprain of unspecified ligament of unspecified ankle, initial encounter: Secondary | ICD-10-CM | POA: Insufficient documentation

## 2014-06-10 DIAGNOSIS — Z8719 Personal history of other diseases of the digestive system: Secondary | ICD-10-CM | POA: Insufficient documentation

## 2014-06-10 DIAGNOSIS — E079 Disorder of thyroid, unspecified: Secondary | ICD-10-CM | POA: Insufficient documentation

## 2014-06-10 DIAGNOSIS — Z8739 Personal history of other diseases of the musculoskeletal system and connective tissue: Secondary | ICD-10-CM | POA: Insufficient documentation

## 2014-06-10 DIAGNOSIS — Z8669 Personal history of other diseases of the nervous system and sense organs: Secondary | ICD-10-CM | POA: Insufficient documentation

## 2014-06-10 DIAGNOSIS — Z87828 Personal history of other (healed) physical injury and trauma: Secondary | ICD-10-CM | POA: Insufficient documentation

## 2014-06-10 DIAGNOSIS — Z8742 Personal history of other diseases of the female genital tract: Secondary | ICD-10-CM | POA: Insufficient documentation

## 2014-06-10 DIAGNOSIS — X500XXA Overexertion from strenuous movement or load, initial encounter: Secondary | ICD-10-CM | POA: Insufficient documentation

## 2014-06-10 DIAGNOSIS — R296 Repeated falls: Secondary | ICD-10-CM | POA: Insufficient documentation

## 2014-06-10 DIAGNOSIS — G2581 Restless legs syndrome: Secondary | ICD-10-CM | POA: Insufficient documentation

## 2014-06-10 DIAGNOSIS — F3289 Other specified depressive episodes: Secondary | ICD-10-CM | POA: Insufficient documentation

## 2014-06-10 DIAGNOSIS — S93402A Sprain of unspecified ligament of left ankle, initial encounter: Secondary | ICD-10-CM

## 2014-06-10 DIAGNOSIS — Y9210 Unspecified residential institution as the place of occurrence of the external cause: Secondary | ICD-10-CM | POA: Insufficient documentation

## 2014-06-10 NOTE — ED Notes (Signed)
Pt transported to xray 

## 2014-06-10 NOTE — ED Notes (Addendum)
Per EMS witnessed fall while transferred to recliner. No LOC or head injury but complaint of left ankle pain. No deformity present.

## 2014-06-10 NOTE — ED Notes (Signed)
Bed: WA23 Expected date:  Expected time:  Means of arrival:  Comments: EMS-fall 

## 2014-06-10 NOTE — ED Notes (Signed)
[facility] aware son transported pt back to [facility] with discharge instructions and DNR.

## 2014-06-10 NOTE — Discharge Instructions (Signed)

## 2014-06-10 NOTE — ED Provider Notes (Addendum)
CSN: [id number]     Arrival date & time 06/10/14  1332 History   First MD Initiated Contact with Patient 06/10/14 1343     Chief Complaint  Patient presents with  . Ankle Pain     (Consider location/radiation/quality/duration/timing/severity/associated sxs/prior Treatment) Patient is a 80 y.o. female presenting with ankle pain. The history is provided by the patient and a friend.  Ankle Pain Location:  Ankle Time since incident:  2 hours Injury: yes   Mechanism of injury: fall   Fall:    Fall occurred:  Standing (pt was getting out of wheelchair after getting her nails done and had trouble pivoting and her ankle twisted and she fell backwards hitting her head on the dresser)   Impact surface:  Hard floor   Point of impact:  Face and feet Ankle location:  L ankle Pain details:    Quality:  Aching   Radiates to:  Does not radiate   Severity:  Mild   Onset quality:  Sudden   Timing:  Constant   Progression:  Unchanged Chronicity:  New Prior injury to area:  No Relieved by:  Rest Worsened by:  Bearing weight Ineffective treatments:  None tried Associated symptoms: no back pain, no muscle weakness, no neck pain and no numbness   Associated symptoms comment:  No LOC or headache   Past Medical History  Diagnosis Date  . Arthritis   . History of chicken pox   . Depression     husband died 08/17/11  . Glaucoma   . Thyroid disease   . Hypertension   . Hypokalemia   . Renal disorder   . Altered mental state   . Blind   . Macular degeneration   . Abrasion of arm, left 02/04/2013  . Compression fracture of T12 vertebra 03/21/2013  . Debility 03/21/2013  . Nasal congestion 03/21/2013  . Vasomotor rhinitis 03/21/2013  . Abnormal LFTs 03/21/2013  . Dental abscess 06/06/2013  . Thrush 08/02/2013  . Paronychia 10/11/2013  . Osteoarthritis of ankle and foot 01/12/2014  . RLS (restless legs syndrome) 01/12/2014   Past Surgical History  Procedure Laterality Date  . Revision total knee  arthroplasty      right knee  . Back surgery    . Cataract extraction, bilateral    . Thyroidectomy    . Abdominal hysterectomy     Family History  Problem Relation Age of Onset  . Other Father     Killed in war  . Other Mother     Deceased, 71  . Breast cancer Daughter   . Hypertension Son   . Hyperlipidemia Son    History  Substance Use Topics  . Smoking status: Never Smoker   . Smokeless tobacco: Never Used  . Alcohol Use: No   OB History   Grav Para Term Preterm Abortions TAB SAB Ect Mult Living                 Review of Systems  Musculoskeletal: Negative for back pain and neck pain.  All other systems reviewed and are negative.     Allergies  Codeine; Doxycycline; Fentanyl; and Lisinopril  Home Medications   Prior to Admission medications   Medication Sig Start Date End Date Taking? Authorizing Provider  atenolol (TENORMIN) 50 MG tablet Take 50 mg by mouth daily.   Yes Historical Provider, MD  Azelastine-Fluticasone 137-50 MCG/ACT SUSP Place 2 sprays into both nostrils daily.   Yes Historical Provider, MD  cetirizine (ZYRTEC) 10  MG tablet Take 1 tablet (10 mg total) by mouth daily. 04/27/13  Yes Mosie Lukes, MD  furosemide (LASIX) 20 MG tablet Take 20 mg by mouth daily.   Yes Historical Provider, MD  gabapentin (NEURONTIN) 300 MG capsule Take 300 mg by mouth at bedtime.   Yes Historical Provider, MD  levothyroxine (SYNTHROID, LEVOTHROID) 88 MCG tablet Take 88 mcg by mouth daily before breakfast.   Yes Historical Provider, MD  loperamide (IMODIUM) 2 MG capsule Take 2 mg by mouth 4 (four) times daily as needed. For diarrhea.   Yes Historical Provider, MD  magnesium oxide (MAG-OX) 400 MG tablet Take 400 mg by mouth daily.   Yes Historical Provider, MD  nystatin (MYCOSTATIN) 100000 UNIT/ML suspension Take 5 mL (500,000 Units total) by mouth 3 (three) times daily. X 7 days then as needed after that, swish and spit 07/27/13  Yes Mosie Lukes, MD  nystatin cream  (MYCOSTATIN) Apply 1 application topically 2 (two) times daily.   Yes Historical Provider, MD  omeprazole (PRILOSEC) 20 MG capsule Take 20 mg by mouth daily.   Yes Historical Provider, MD  polyvinyl alcohol (LIQUIFILM TEARS) 1.4 % ophthalmic solution Place 1 drop into both eyes 2 (two) times daily. Wait 3-5 minutes between eye drops.   Yes Historical Provider, MD  Probiotic Product (PROBIOTIC PEARLS ADVANTAGE PO) Take 1 tablet by mouth daily.   Yes Historical Provider, MD  rOPINIRole (REQUIP) 1 MG tablet Take 1 mg by mouth 2 (two) times daily.   Yes Historical Provider, MD  rOPINIRole (REQUIP) 1 MG tablet Take 2 mg by mouth 2 (two) times daily.   Yes Historical Provider, MD  sertraline (ZOLOFT) 100 MG tablet Take 100 mg by mouth daily.   Yes Historical Provider, MD  vitamin B-12 (CYANOCOBALAMIN) 1000 MCG tablet Take 1,000 mcg by mouth daily.   Yes Historical Provider, MD  benzocaine (ORAJEL) 10 % mucosal gel Use as directed 1 application in the mouth or throat every 3 (three) hours as needed for mouth pain.     Historical Provider, MD  nystatin (MYCOSTATIN/NYSTOP) 100000 UNIT/GM POWD Apply topically.    Historical Provider, MD  Vitamin D, Ergocalciferol, (DRISDOL) 50000 UNITS CAPS capsule Take 1 capsule (50,000 Units total) by mouth every 7 (seven) days. X 12 weeks, then recheck Vitamin D level in 12 weeks 01/20/14   Mosie Lukes, MD   BP 151/62  Pulse 66  Temp(Src) 97.6 F (36.4 C) (Oral)  Resp 17  SpO2 95% Physical Exam  Nursing note and vitals reviewed. Constitutional: She is oriented to person, place, and time. She appears well-developed and well-nourished. No distress.  HENT:  Head: Normocephalic and atraumatic.  Mouth/Throat: Oropharynx is clear and moist.  No contusions, hematomas or ecchymosis to the scalp  Eyes: Conjunctivae and EOM are normal. Pupils are equal, round, and reactive to light.  Neck: Neck supple. No spinous process tenderness and no muscular tenderness present.  Decreased range of motion present.  Severe kyphosis  Cardiovascular: Normal rate and intact distal pulses.   Pulmonary/Chest: Effort normal.  Musculoskeletal: She exhibits no edema.       Right hip: Normal.       Left hip: Normal.       Right knee: Normal.       Left knee: Normal.       Left ankle: She exhibits swelling. She exhibits normal range of motion and no deformity. Tenderness. Medial malleolus tenderness found. No lateral malleolus, no head of 5th metatarsal  and no proximal fibula tenderness found.  Neurological: She is alert and oriented to person, place, and time.  Skin: Skin is warm and dry. No rash noted. No erythema.  Psychiatric: She has a normal mood and affect. Her behavior is normal.    ED Course  Procedures (including critical care time) Labs Review Labs Reviewed - No data to display  Imaging Review Dg Ankle Complete Left  06/10/2014   CLINICAL DATA:  Status post fall with ankle pain  EXAM: LEFT ANKLE COMPLETE - 3+ VIEW  COMPARISON:  None.  FINDINGS: There is no evidence of fracture, dislocation, or joint effusion. There is diffuse osteopenia. There is soft tissue swelling laterally.  IMPRESSION: No acute fracture or dislocation. Soft tissue swelling lateral ankle. Diffuse osteopenia.   Electronically Signed   By: Abelardo Diesel M.D.   On: 06/10/2014 15:24     EKG Interpretation None      MDM   Final diagnoses:  Ankle sprain, left, initial encounter    Patient with a mechanical fall while she was getting out of her wheelchair today at the nursing home. She was having difficulty pivoting causing her ankle to twist and she fell backwards hitting her head on a dresser drawer without LOC.  However since fall c/o of left ankle pain. No knee or hip pain on exam.  Pt does not use anticoagulation and has no signs of trauma to the head.  No neck pain.  Do not feel pt needs CT of head at this time.  Plain films of left ankle pending.  3:28 PM Plain film neg.  Will  d/c    Blanchie Dessert, MD 06/10/14 1528  Blanchie Dessert, MD 06/10/14 1529

## 2014-06-10 NOTE — ED Notes (Signed)
Per staff from nursing home, pt head right posterior portion of head. Pt denies head injury or pain at site. No redness or swelling noted to right posterior portion of head upon assessment. MD Plunkett made aware.

## 2014-06-28 ENCOUNTER — Non-Acute Institutional Stay (SKILLED_NURSING_FACILITY): Payer: Medicare Other | Admitting: Adult Health

## 2014-06-28 DIAGNOSIS — D638 Anemia in other chronic diseases classified elsewhere: Secondary | ICD-10-CM

## 2014-06-28 DIAGNOSIS — J300 Vasomotor rhinitis: Secondary | ICD-10-CM

## 2014-06-28 DIAGNOSIS — K219 Gastro-esophageal reflux disease without esophagitis: Secondary | ICD-10-CM

## 2014-06-28 DIAGNOSIS — G2581 Restless legs syndrome: Secondary | ICD-10-CM

## 2014-06-28 DIAGNOSIS — F341 Dysthymic disorder: Secondary | ICD-10-CM

## 2014-06-28 DIAGNOSIS — M3500 Sicca syndrome, unspecified: Secondary | ICD-10-CM

## 2014-06-28 DIAGNOSIS — J309 Allergic rhinitis, unspecified: Secondary | ICD-10-CM

## 2014-06-28 DIAGNOSIS — I10 Essential (primary) hypertension: Secondary | ICD-10-CM

## 2014-06-28 DIAGNOSIS — F418 Other specified anxiety disorders: Secondary | ICD-10-CM

## 2014-06-28 DIAGNOSIS — E890 Postprocedural hypothyroidism: Secondary | ICD-10-CM

## 2014-06-28 LAB — BASIC METABOLIC PANEL
BUN: 18 mg/dL (ref 4–21)
Creatinine: 0.9 mg/dL (ref 0.5–1.1)
GLUCOSE: 70 mg/dL
POTASSIUM: 3.7 mmol/L (ref 3.4–5.3)
Sodium: 140 mmol/L (ref 137–147)

## 2014-06-28 LAB — HEPATIC FUNCTION PANEL
ALT: 6 U/L — AB (ref 7–35)
AST: 9 U/L — AB (ref 13–35)
Alkaline Phosphatase: 126 U/L — AB (ref 25–125)
BILIRUBIN, TOTAL: 0.5 mg/dL

## 2014-06-28 LAB — CBC AND DIFFERENTIAL
HEMATOCRIT: 31 % — AB (ref 36–46)
HEMOGLOBIN: 10.2 g/dL — AB (ref 12.0–16.0)
Platelets: 161 10*3/uL (ref 150–399)
WBC: 5.2 10^3/mL

## 2014-06-29 ENCOUNTER — Encounter: Payer: Self-pay | Admitting: Adult Health

## 2014-06-29 DIAGNOSIS — M3500 Sicca syndrome, unspecified: Secondary | ICD-10-CM | POA: Insufficient documentation

## 2014-06-29 DIAGNOSIS — I10 Essential (primary) hypertension: Secondary | ICD-10-CM | POA: Insufficient documentation

## 2014-06-29 NOTE — Progress Notes (Signed)
Patient ID: [name] [name], female   DOB: 09-17-1919, 80 y.o.   MRN: [id number]     [facility]  Allergies  Allergen Reactions  . Codeine Other (See Comments)    insomnia  . Doxycycline Diarrhea and Other (See Comments)    Hallucinations  . Fentanyl Hives and Rash  . Lisinopril Nausea And Vomiting, Rash and Hypertension     Chief Complaint  Patient presents with  . Acute Visit    followo up transfer     HPI:  She has been transferred to this facility for long term placement. She had been living in assisted living and is no longer able to live in that environment.  Her son is concerned today that she did not recognize him. However; when I questioned her she is alert to her surroundings and knew who her son was. She is chronically depressed; her son states that her mood state is stable on her current medication and feels as though adjusting her dosing will not be beneficial to her. She is DNR and her family does not want a feeding tube inserted in the future.     Past Medical History  Diagnosis Date  . Arthritis   . History of chicken pox   . Depression     husband died 17-Aug-2011  . Glaucoma   . Thyroid disease   . Hypertension   . Hypokalemia   . Renal disorder   . Altered mental state   . Blind   . Macular degeneration   . Abrasion of arm, left 02/04/2013  . Compression fracture of T12 vertebra 03/21/2013  . Debility 03/21/2013  . Nasal congestion 03/21/2013  . Vasomotor rhinitis 03/21/2013  . Abnormal LFTs 03/21/2013  . Dental abscess 06/06/2013  . Thrush 08/02/2013  . Paronychia 10/11/2013  . Osteoarthritis of ankle and foot 01/12/2014  . RLS (restless legs syndrome) 01/12/2014  . Sacral decubitus ulcer, stage III 08/06/2012    Past Surgical History  Procedure Laterality Date  . Revision total knee arthroplasty      right knee  . Back surgery    . Cataract extraction, bilateral    . Thyroidectomy    . Abdominal hysterectomy      VITAL SIGNS BP 155/78  Pulse 65   Wt 125 lb (56.7 kg)  SpO2 95%   Patient's Medications  New Prescriptions   No medications on file  Previous Medications   ATENOLOL (TENORMIN) 50 MG TABLET    Take 50 mg by mouth daily.   AZELASTINE-FLUTICASONE 137-50 MCG/ACT SUSP    Place 2 sprays into both nostrils daily.   BENZOCAINE (ORAJEL) 10 % MUCOSAL GEL    Use as directed 1 application in the mouth or throat every 3 (three) hours as needed for mouth pain.    CETIRIZINE (ZYRTEC) 10 MG TABLET    Take 1 tablet (10 mg total) by mouth daily.   FERROUS SULFATE 325 (65 FE) MG TABLET    Take 325 mg by mouth daily with breakfast.   FUROSEMIDE (LASIX) 20 MG TABLET    Take 20 mg by mouth daily.   GABAPENTIN (NEURONTIN) 300 MG CAPSULE    Take 300 mg by mouth at bedtime.   LEVOTHYROXINE (SYNTHROID, LEVOTHROID) 88 MCG TABLET    Take 88 mcg by mouth daily before breakfast.   LOPERAMIDE (IMODIUM) 2 MG CAPSULE    Take 2 mg by mouth 4 (four) times daily as needed. For diarrhea.   MAGNESIUM OXIDE (MAG-OX) 400 MG TABLET  Take 400 mg by mouth daily.   NYSTATIN CREAM (MYCOSTATIN)    Apply 1 application topically 2 (two) times daily.   OMEPRAZOLE (PRILOSEC) 20 MG CAPSULE    Take 20 mg by mouth daily.   PILOCARPINE (SALAGEN) 5 MG TABLET    Take 5 mg by mouth 3 (three) times daily.   POLYVINYL ALCOHOL (LIQUIFILM TEARS) 1.4 % OPHTHALMIC SOLUTION    Place 1 drop into both eyes 2 (two) times daily. Wait 3-5 minutes between eye drops.   PROBIOTIC PRODUCT (PROBIOTIC PEARLS ADVANTAGE PO)    Take 1 tablet by mouth daily.   ROPINIROLE (REQUIP) 1 MG TABLET    Take 1 mg by mouth 2 (two) times daily. 2 AM and 2 PM   ROPINIROLE (REQUIP) 1 MG TABLET    Take 2 mg by mouth 2 (two) times daily. 8 AM and 8 PM   SERTRALINE (ZOLOFT) 100 MG TABLET    Take 100 mg by mouth daily.  Modified Medications   No medications on file  Discontinued Medications   NYSTATIN (MYCOSTATIN) 100000 UNIT/ML SUSPENSION    Take 5 mL (500,000 Units total) by mouth 3 (three) times daily. X 7  days then as needed after that, swish and spit   NYSTATIN (MYCOSTATIN/NYSTOP) 100000 UNIT/GM POWD    Apply 1 Bottle topically 2 (two) times daily as needed (rash).    VITAMIN B-12 (CYANOCOBALAMIN) 1000 MCG TABLET    Take 1,000 mcg by mouth daily.    SIGNIFICANT DIAGNOSTIC EXAMS  NONE RECENT   LABS REVIEWED:   01-12-14: wbc 5.1; hgb 12.2; hct 36.2; mcv 92.1; plt 217; glucose 76; bun 26; creat 0.99; k+3.9; na++141; liver normal albumin 3.4; tsh 0.266; vit b12: 1261; mag 2.2; phos 3.7; vit d 12    Review of Systems  Constitutional: Negative for malaise/fatigue.  Respiratory: Negative for cough and shortness of breath.   Cardiovascular: Negative for chest pain, palpitations and leg swelling.  Gastrointestinal: Negative for heartburn and abdominal pain.  Musculoskeletal: Negative for back pain and myalgias.  Skin: Negative.   Psychiatric/Behavioral: Positive for depression.     Physical Exam  Constitutional: She appears well-developed and well-nourished. No distress.  thin  Neck: Neck supple. No JVD present.  No palpable thyroid tissue present.   Cardiovascular: Normal rate, regular rhythm and intact distal pulses.   Murmur heard. Respiratory: Effort normal. No respiratory distress. She has no wheezes.  GI: Soft. Bowel sounds are normal. She exhibits no distension. There is no tenderness.  Musculoskeletal: She exhibits no edema.  Is able to move all extremities; is able to stand; has significant kyphosis; with neck contracture and is unable to hold head upright  Neurological: She is alert.  Skin: Skin is warm and dry. She is not diaphoretic.  Psychiatric: She has a normal mood and affect.       ASSESSMENT/ PLAN:  1. Hypothyroidism: she is status post thyroidectomy; is on synthroid 88 mcg daily her tsh is pending. Will not make changes will monitor her status  2. Vasomotor rhinitis: is stable will continue astelin nasal spray daily and zyrtec 10 mg daily and will monitor    3. Gerd: will continue prilosec 20 mg daily   4. Anemia; her hgb is stable at this time; will continue her iron daily; her lab work is pending; will not make changes will monitor her status.   5. Depression; is long term issue for her; her mood state is poor but stable; will continue her zoloft 100 mg  daily and will monitor her status.   6. Hypertension: is stable will continue tenormin 50 mg daily   7. RLS: she is presently stable will continue requip 2 mg twice daily alternating with 1 mg twice daily; she is on neurontin 300 mg nightly and will continue to monitor her status.    8. Sjogren's syndrome: will continue her pilocarpine 5 mg po three times daily and her oral solution daily will not make changes and will monitor her status.    Time spent with patient 50 minutes     Ok Edwards NP [facility]  Contact [phone] Monday through Friday 8am- 5pm  After hours call [phone]

## 2014-07-01 ENCOUNTER — Non-Acute Institutional Stay (SKILLED_NURSING_FACILITY): Payer: Medicare Other | Admitting: Internal Medicine

## 2014-07-01 ENCOUNTER — Encounter: Payer: Self-pay | Admitting: Internal Medicine

## 2014-07-01 DIAGNOSIS — E038 Other specified hypothyroidism: Secondary | ICD-10-CM

## 2014-07-01 DIAGNOSIS — F418 Other specified anxiety disorders: Secondary | ICD-10-CM

## 2014-07-01 DIAGNOSIS — K219 Gastro-esophageal reflux disease without esophagitis: Secondary | ICD-10-CM

## 2014-07-01 DIAGNOSIS — F341 Dysthymic disorder: Secondary | ICD-10-CM

## 2014-07-01 DIAGNOSIS — D638 Anemia in other chronic diseases classified elsewhere: Secondary | ICD-10-CM

## 2014-07-01 DIAGNOSIS — G2581 Restless legs syndrome: Secondary | ICD-10-CM

## 2014-07-01 DIAGNOSIS — I10 Essential (primary) hypertension: Secondary | ICD-10-CM

## 2014-07-01 NOTE — Progress Notes (Signed)
Patient ID: [name] [name], female   DOB: Jul 02, 1919, 80 y.o.   MRN: [id number]     Facility: [facility]    PCP: Blanchie Serve, MD  Code Status: DNR  Allergies  Allergen Reactions  . Codeine Other (See Comments)    insomnia  . Doxycycline Diarrhea and Other (See Comments)    Hallucinations  . Fentanyl Hives and Rash  . Lisinopril Nausea And Vomiting, Rash and Hypertension    Chief Complaint: new admission  HPI:  80 y/o female patient is here for long term care. She was residing in an assisted living centre prior to this. She has history of HTN, GERD, Sjogren's syndrome, depression. She is seen in her room today. She is at her baseline, alert and oriented to person only and is in no distress. She is eating her wafers. No new concern from staff. No falls reported. No new skin concern  Review of Systems:  Difficult to obtain ROS with her cognition impairment Constitutional: Negative for fever, chills, diaphoresis.  HENT: Negative for congestion   Eyes: Negative for eye pain, discharge.  Respiratory: Negative for cough, sputum production, shortness of breath and wheezing.   Cardiovascular: Negative for chest pain, palpitations, leg swelling.  Gastrointestinal: Negative for heartburn, nausea, vomiting, abdominal pain Genitourinary: Negative for dysuria Musculoskeletal: Negative for falls, on wheelchair Skin: Negative for itching and rash.  Neurological: Negative for focal weakness and headaches.  Psychiatric/Behavioral: The patient is not nervous/anxious.     Past Medical History  Diagnosis Date  . Arthritis   . History of chicken pox   . Depression     husband died 2011/09/07  . Glaucoma   . Thyroid disease   . Hypertension   . Hypokalemia   . Renal disorder   . Altered mental state   . Blind   . Macular degeneration   . Abrasion of arm, left 02/04/2013  . Compression fracture of T12 vertebra 03/21/2013  . Debility 03/21/2013  . Nasal  congestion 03/21/2013  . Vasomotor rhinitis 03/21/2013  . Abnormal LFTs 03/21/2013  . Dental abscess 06/06/2013  . Thrush 08/02/2013  . Paronychia 10/11/2013  . Osteoarthritis of ankle and foot 01/12/2014  . RLS (restless legs syndrome) 01/12/2014  . Sacral decubitus ulcer, stage III 08/06/2012  . C. difficile diarrhea 09/12/2012   Past Surgical History  Procedure Laterality Date  . Revision total knee arthroplasty      right knee  . Back surgery    . Cataract extraction, bilateral    . Thyroidectomy    . Abdominal hysterectomy     Social History:   reports that she has never smoked. She has never used smokeless tobacco. She reports that she does not drink alcohol or use illicit drugs.  Family History  Problem Relation Age of Onset  . Other Father     Killed in war  . Other Mother     Deceased, 52  . Breast cancer Daughter   . Hypertension Son   . Hyperlipidemia Son     Medications: Patient's Medications  New Prescriptions   No medications on file  Previous Medications   ATENOLOL (TENORMIN) 50 MG TABLET    Take 50 mg by mouth daily.   AZELASTINE-FLUTICASONE 137-50 MCG/ACT SUSP    Place 2 sprays into both nostrils daily.   BENZOCAINE (ORAJEL) 10 % MUCOSAL GEL    Use as directed 1 application in the mouth or throat every 3 (three) hours as needed for mouth pain.  CETIRIZINE (ZYRTEC) 10 MG TABLET    Take 1 tablet (10 mg total) by mouth daily.   FERROUS SULFATE 325 (65 FE) MG TABLET    Take 325 mg by mouth daily with breakfast.   FUROSEMIDE (LASIX) 20 MG TABLET    Take 20 mg by mouth daily.   GABAPENTIN (NEURONTIN) 300 MG CAPSULE    Take 300 mg by mouth at bedtime.   LEVOTHYROXINE (SYNTHROID, LEVOTHROID) 88 MCG TABLET    Take 88 mcg by mouth daily before breakfast.   LOPERAMIDE (IMODIUM) 2 MG CAPSULE    Take 2 mg by mouth 4 (four) times daily as needed. For diarrhea.   MAGNESIUM OXIDE (MAG-OX) 400 MG TABLET    Take 400 mg by mouth daily.   NYSTATIN CREAM (MYCOSTATIN)    Apply 1  application topically 2 (two) times daily.   OMEPRAZOLE (PRILOSEC) 20 MG CAPSULE    Take 20 mg by mouth daily.   PILOCARPINE (SALAGEN) 5 MG TABLET    Take 5 mg by mouth 3 (three) times daily.   POLYVINYL ALCOHOL (LIQUIFILM TEARS) 1.4 % OPHTHALMIC SOLUTION    Place 1 drop into both eyes 2 (two) times daily. Wait 3-5 minutes between eye drops.   PROBIOTIC PRODUCT (PROBIOTIC PEARLS ADVANTAGE PO)    Take 1 tablet by mouth daily.   ROPINIROLE (REQUIP) 1 MG TABLET    Take 1 mg by mouth 2 (two) times daily. 2 AM and 2 PM   ROPINIROLE (REQUIP) 1 MG TABLET    Take 2 mg by mouth 2 (two) times daily. 8 AM and 8 PM   SERTRALINE (ZOLOFT) 100 MG TABLET    Take 100 mg by mouth daily.  Modified Medications   No medications on file  Discontinued Medications   No medications on file     Physical Exam:  Filed Vitals:   07/01/14 1003  BP: 110/70  Pulse: 60  Temp: 98.5 F (36.9 C)  Resp: 18  SpO2: 97%    General- elderly female in no acute distress Head- atraumatic, normocephalic Eyes- no pallor, no icterus, no discharge Neck- no lymphadenopathy Cardiovascular- normal s1,s2, no murmurs Respiratory- bilateral clear to auscultation, no wheeze, no rhonchi, no crackles, no use of accessory muscles Abdomen- bowel sounds present, soft, non tender Musculoskeletal- able to move all 4 extremities, has kyphosis with torticollis present, on wheelchair, no leg edema  Neurological- no focal deficit, alert and oriented to person Skin- warm and dry Psychiatry-  normal mood and affect   Labs reviewed: Basic Metabolic Panel:  Recent Labs  01/12/14 1202  NA 141  K 3.9  CL 104  CO2 29  GLUCOSE 76  BUN 20  CREATININE 0.99  CALCIUM 9.2  MG 2.2  PHOS 3.7   Liver Function Tests:  Recent Labs  01/12/14 1202  AST 14  ALT 9  ALKPHOS 134*  BILITOT 0.4  PROT 7.0  ALBUMIN 3.4*  3.4*   No results found for this basename: LIPASE, AMYLASE,  in the last 8760 hours No results found for this basename:  AMMONIA,  in the last 8760 hours CBC:  Recent Labs  01/12/14 1202  WBC 5.1  HGB 12.2  HCT 36.2  MCV 92.1  PLT 217    Assessment/Plan  Depression Continue zoloft 100 mg daily for now and monitor her mood and behavioral changes  Hypothyroidism post thyroidectomy and on levothyroxine 88 mcg daily. Check tsh and adjust dose if needed  RLS Stable. Is on requip at 2 am  at night, to make a convenient schedule for her, chage requip 1 mg 2 tab at 6 am and 6 pm and 1 mg 1 tab at 12 pm and 11 pm, reassess. Also on gabapentin  Jerrye Bushy continue prilosec 20 mg daily for now, taper her off if tolerated  Anemia Continue iron supplement, check cbc  Hypertension continue tenormin 50 mg daily    Family/ staff Communication: reviewed care plan with patient and nursing supervisor   Goals of care: long term care  Labs/tests ordered: cbc, cmp, tsh, lipid    Blanchie Serve, MD  [facility] [phone] (Monday-Friday 8 am - 5 pm) [phone] (afterhours)

## 2014-07-02 LAB — HEPATIC FUNCTION PANEL
ALT: 7 U/L (ref 7–35)
AST: 10 U/L — AB (ref 13–35)
Alkaline Phosphatase: 149 U/L — AB (ref 25–125)
Bilirubin, Total: 0.4 mg/dL

## 2014-07-02 LAB — BASIC METABOLIC PANEL
BUN: 18 mg/dL (ref 4–21)
Creatinine: 1 mg/dL (ref 0.5–1.1)
Glucose: 72 mg/dL
Potassium: 3.9 mmol/L (ref 3.4–5.3)
Sodium: 141 mmol/L (ref 137–147)

## 2014-07-02 LAB — CBC AND DIFFERENTIAL
HCT: 34 % — AB (ref 36–46)
Hemoglobin: 11.6 g/dL — AB (ref 12.0–16.0)
PLATELETS: 169 10*3/uL (ref 150–399)
WBC: 5.9 10^3/mL

## 2014-07-02 LAB — LIPID PANEL
LDL CALC: 156 mg/dL
TRIGLYCERIDES: 97 mg/dL (ref 40–160)

## 2014-07-02 LAB — TSH: TSH: 3.86 u[IU]/mL (ref 0.41–5.90)

## 2014-07-21 ENCOUNTER — Non-Acute Institutional Stay (SKILLED_NURSING_FACILITY): Payer: Medicare Other | Admitting: Adult Health

## 2014-07-21 DIAGNOSIS — D638 Anemia in other chronic diseases classified elsewhere: Secondary | ICD-10-CM

## 2014-07-21 DIAGNOSIS — M3500 Sicca syndrome, unspecified: Secondary | ICD-10-CM

## 2014-07-21 DIAGNOSIS — K219 Gastro-esophageal reflux disease without esophagitis: Secondary | ICD-10-CM

## 2014-07-21 DIAGNOSIS — E038 Other specified hypothyroidism: Secondary | ICD-10-CM

## 2014-07-21 DIAGNOSIS — F418 Other specified anxiety disorders: Secondary | ICD-10-CM

## 2014-07-21 DIAGNOSIS — G2581 Restless legs syndrome: Secondary | ICD-10-CM

## 2014-07-21 DIAGNOSIS — I10 Essential (primary) hypertension: Secondary | ICD-10-CM

## 2014-07-21 DIAGNOSIS — F341 Dysthymic disorder: Secondary | ICD-10-CM

## 2014-07-21 DIAGNOSIS — J300 Vasomotor rhinitis: Secondary | ICD-10-CM

## 2014-07-21 DIAGNOSIS — R609 Edema, unspecified: Secondary | ICD-10-CM

## 2014-07-21 DIAGNOSIS — J309 Allergic rhinitis, unspecified: Secondary | ICD-10-CM

## 2014-08-02 ENCOUNTER — Encounter: Payer: Self-pay | Admitting: Adult Health

## 2014-08-02 DIAGNOSIS — R609 Edema, unspecified: Secondary | ICD-10-CM | POA: Insufficient documentation

## 2014-08-02 NOTE — Progress Notes (Signed)
Patient ID: [name] [name], female   DOB: 05-17-1919, 80 y.o.   MRN: [id number]     [facility]  Allergies  Allergen Reactions  . Codeine Other (See Comments)    insomnia  . Doxycycline Diarrhea and Other (See Comments)    Hallucinations  . Fentanyl Hives and Rash  . Lisinopril Nausea And Vomiting, Rash and Hypertension      Chief Complaint  Patient presents with  . Medical Management of Chronic Issues    HPI:  She is a long term resident of this facility being seen for the management of her chronic illnesses. Overall her status is without significant change. She is not voicing any concerns and there are no concerns being voiced by the nursing staff at this time.    Past Medical History  Diagnosis Date  . Arthritis   . History of chicken pox   . Depression     husband died 09-08-11  . Glaucoma   . Thyroid disease   . Hypertension   . Hypokalemia   . Renal disorder   . Altered mental state   . Blind   . Macular degeneration   . Abrasion of arm, left 02/04/2013  . Compression fracture of T12 vertebra 03/21/2013  . Debility 03/21/2013  . Nasal congestion 03/21/2013  . Vasomotor rhinitis 03/21/2013  . Abnormal LFTs 03/21/2013  . Dental abscess 06/06/2013  . Thrush 08/02/2013  . Paronychia 10/11/2013  . Osteoarthritis of ankle and foot 01/12/2014  . RLS (restless legs syndrome) 01/12/2014  . Sacral decubitus ulcer, stage III 08/06/2012  . C. difficile diarrhea 09/12/2012    Past Surgical History  Procedure Laterality Date  . Revision total knee arthroplasty      right knee  . Back surgery    . Cataract extraction, bilateral    . Thyroidectomy    . Abdominal hysterectomy      VITAL SIGNS BP 118/76  Pulse 75  Ht 5\' 2"  (1.575 m)  Wt 128 lb 11.2 oz (58.378 kg)  BMI 23.53 kg/m2   Patient's Medications  New Prescriptions   No medications on file  Previous Medications   ATENOLOL (TENORMIN) 50 MG TABLET    Take 50 mg by mouth daily.   AZELASTINE-FLUTICASONE 137-50  MCG/ACT SUSP    Place 2 sprays into both nostrils daily.   CETIRIZINE (ZYRTEC) 10 MG TABLET    Take 1 tablet (10 mg total) by mouth daily.   FERROUS SULFATE 325 (65 FE) MG TABLET    Take 325 mg by mouth daily with breakfast.   GABAPENTIN (NEURONTIN) 300 MG CAPSULE    Take 300 mg by mouth at bedtime.   LEVOTHYROXINE (SYNTHROID, LEVOTHROID) 88 MCG TABLET    Take 88 mcg by mouth daily before breakfast.   LOPERAMIDE (IMODIUM) 2 MG CAPSULE    Take 2 mg by mouth 4 (four) times daily as needed. For diarrhea.   MAGNESIUM OXIDE (MAG-OX) 400 MG TABLET    Take 400 mg by mouth daily.   NYSTATIN CREAM (MYCOSTATIN)    Apply 1 application topically 2 (two) times daily.   OMEPRAZOLE (PRILOSEC) 20 MG CAPSULE    Take 20 mg by mouth daily.   PILOCARPINE (SALAGEN) 5 MG TABLET    Take 5 mg by mouth 3 (three) times daily.   POLYVINYL ALCOHOL (LIQUIFILM TEARS) 1.4 % OPHTHALMIC SOLUTION    Place 1 drop into both eyes 2 (two) times daily. Wait 3-5 minutes between eye drops.   PROBIOTIC PRODUCT (PROBIOTIC PEARLS ADVANTAGE  PO)    Take 1 tablet by mouth daily.   ROPINIROLE (REQUIP) 1 MG TABLET    Take 1 mg by mouth 2 (two) times daily. 2 AM and 2 PM   ROPINIROLE (REQUIP) 1 MG TABLET    Take 2 mg by mouth 2 (two) times daily. 8 AM and 8 PM   SERTRALINE (ZOLOFT) 100 MG TABLET    Take 100 mg by mouth daily.  Modified Medications   No medications on file  Discontinued Medications   BENZOCAINE (ORAJEL) 10 % MUCOSAL GEL    Use as directed 1 application in the mouth or throat every 3 (three) hours as needed for mouth pain.     SIGNIFICANT DIAGNOSTIC EXAMS   NONE RECENT   LABS REVIEWED:   01-12-14: wbc 5.1; hgb 12.2; hct 36.2; mcv 92.1; plt 217; glucose 76; bun 26; creat 0.99; k+3.9; na++141; liver normal albumin 3.4; tsh 0.266; vit b12: 1261; mag 2.2; phos 3.7; vit d 12  06-28-14: wbc 5.2; hgb 10.2; hct 31.3; mcv 96; plt 161; glucose 70; bun 18; creat 0.9; k+3.7; na++140; liver normal albumin 2.6 07-02-14: wbc 5.9; hgb  11.6; hct 33.9; mcv 96.3; plt 169;glucose 70; bun 18; creat 1.0; k+3.9; na++141; liver normal albumin 3.0; chol 223; ldl 156; trig 97; ferritin 75; tsh 3.862    Review of Systems  Constitutional: Negative for malaise/fatigue.  Respiratory: Negative for cough and shortness of breath.   Cardiovascular: Negative for chest pain, palpitations and leg swelling.  Gastrointestinal: Negative for heartburn and abdominal pain.  Musculoskeletal: Negative for back pain and myalgias.  Skin: Negative.   Psychiatric/Behavioral: Positive for depression.     Physical Exam  Constitutional: She appears well-developed and well-nourished. No distress.  thin  Neck: Neck supple. No JVD present.  No palpable thyroid tissue present.   Cardiovascular: Normal rate, regular rhythm and intact distal pulses.   Murmur heard. Respiratory: Effort normal. No respiratory distress. She has no wheezes.  GI: Soft. Bowel sounds are normal. She exhibits no distension. There is no tenderness.  Musculoskeletal: has 1+ lower extremity edema  Is able to move all extremities; is able to stand; has significant kyphosis; with neck contracture and is unable to hold head upright  Neurological: She is alert.  Skin: Skin is warm and dry. She is not diaphoretic.  Psychiatric: She has a normal mood and affect.       ASSESSMENT/ PLAN:  1. Hypothyroidism: she is status post thyroidectomy; is on synthroid 88 mcg daily her tsh is 3.862   2. Vasomotor rhinitis: is stable will continue astelin nasal spray daily and zyrtec 10 mg daily and will monitor   3. Gerd: will continue prilosec 20 mg daily   4. Anemia; her hgb is stable at this time; will stop her iron and will check cbc in 3 months.   5. Depression; is long term issue for her; her mood state is poor but stable; will continue her zoloft 100 mg daily and will monitor her status.   6. Hypertension: is stable will continue tenormin 50 mg daily   7. RLS: she is presently  stable will continue requip 2 mg twice daily alternating with 1 mg twice daily; she is on neurontin 300 mg nightly and will continue to monitor her status.    8. Sjogren's syndrome: will continue her pilocarpine 5 mg po three times daily and her oral solution daily will not make changes and will monitor her status.   9. Edema: will begin lasix  20 mg daily and will check bmp in 2 weeks.     Ok Edwards NP [facility]  Contact [phone] Monday through Friday 8am- 5pm  After hours call [phone]

## 2014-08-19 ENCOUNTER — Ambulatory Visit: Payer: Medicare Other | Admitting: Podiatry

## 2014-08-24 ENCOUNTER — Non-Acute Institutional Stay: Payer: Medicare Other | Admitting: Adult Health Nurse Practitioner

## 2014-08-24 ENCOUNTER — Encounter: Payer: Self-pay | Admitting: Adult Health Nurse Practitioner

## 2014-08-24 ENCOUNTER — Non-Acute Institutional Stay (SKILLED_NURSING_FACILITY): Payer: Medicare Other | Admitting: Adult Health

## 2014-08-24 DIAGNOSIS — D638 Anemia in other chronic diseases classified elsewhere: Secondary | ICD-10-CM

## 2014-08-24 DIAGNOSIS — F418 Other specified anxiety disorders: Secondary | ICD-10-CM

## 2014-08-24 DIAGNOSIS — G2581 Restless legs syndrome: Secondary | ICD-10-CM

## 2014-08-24 DIAGNOSIS — R609 Edema, unspecified: Secondary | ICD-10-CM

## 2014-08-24 DIAGNOSIS — M3500 Sicca syndrome, unspecified: Secondary | ICD-10-CM

## 2014-08-24 DIAGNOSIS — I10 Essential (primary) hypertension: Secondary | ICD-10-CM

## 2014-08-24 DIAGNOSIS — E038 Other specified hypothyroidism: Secondary | ICD-10-CM

## 2014-08-24 DIAGNOSIS — J300 Vasomotor rhinitis: Secondary | ICD-10-CM

## 2014-08-24 DIAGNOSIS — E039 Hypothyroidism, unspecified: Secondary | ICD-10-CM

## 2014-08-24 DIAGNOSIS — F341 Dysthymic disorder: Secondary | ICD-10-CM

## 2014-08-24 NOTE — Progress Notes (Signed)
Code Status: DNR  Allergies  Allergen Reactions  . Codeine Other (See Comments)    insomnia  . Doxycycline Diarrhea and Other (See Comments)    Hallucinations  . Fentanyl Hives and Rash  . Lisinopril Nausea And Vomiting, Rash and Hypertension    Chief Complaint: Medical Management of Chronic Health Conditions  HPI:  [name] is a 80 yr old female being seen today for routine visit. She has multiple medical conditions. Staff denies any complaints or concerns regarding patient. The patients primary concern is regarding her restless leg syndrome. She does not feel her legs are worsening and feel they are actually doing fine, however she desires to be awakened at night to take her requip. She denies joint pain, burning to legs/feet, muscle fatigue. She denies chest pain or shortness of breath.   Review of Systems:  Constitutional: Negative for fever, chills, weight loss, malaise/fatigue and diaphoresis.   Respiratory: Negative for cough, sputum production, shortness of breath and wheezing.   Cardiovascular: Negative for chest pain, palpitations, orthopnea and leg swelling.  Gastrointestinal: Negative for heartburn, nausea, vomiting, abdominal pain, diarrhea and constipation.  Genitourinary: Negative for dysuria, urgency, frequency, hematuria and flank pain.  Musculoskeletal: Positive for kyphosis, neck contracture, restless leg. Negative for back pain, falls, and myalgias.  Skin: Negative for itching and rash.  Neurological: Negative for weakness,dizziness, tingling, focal weakness and headaches.  Psychiatric/Behavioral: Negative for depression and memory loss. The patient is not nervous/anxious.     Past Medical History  Diagnosis Date  . Arthritis   . History of chicken pox   . Depression     husband died 08/22/2011  . Glaucoma   . Thyroid disease   . Hypertension   . Hypokalemia   . Renal disorder   . Altered mental state   . Blind   . Macular degeneration   .  Abrasion of arm, left 02/04/2013  . Compression fracture of T12 vertebra 03/21/2013  . Debility 03/21/2013  . Nasal congestion 03/21/2013  . Vasomotor rhinitis 03/21/2013  . Abnormal LFTs 03/21/2013  . Dental abscess 06/06/2013  . Thrush 08/02/2013  . Paronychia 10/11/2013  . Osteoarthritis of ankle and foot 01/12/2014  . RLS (restless legs syndrome) 01/12/2014  . Sacral decubitus ulcer, stage III 08/06/2012  . C. difficile diarrhea 09/12/2012   Past Surgical History  Procedure Laterality Date  . Revision total knee arthroplasty      right knee  . Back surgery    . Cataract extraction, bilateral    . Thyroidectomy    . Abdominal hysterectomy     Social History:   reports that she has never smoked. She has never used smokeless tobacco. She reports that she does not drink alcohol or use illicit drugs.  Family History  Problem Relation Age of Onset  . Other Father     Killed in war  . Other Mother     Deceased, 15  . Breast cancer Daughter   . Hypertension Son   . Hyperlipidemia Son      Last Vitals:  Filed Vitals:   08/24/14 0950  BP: 102/59  Pulse: 54  Temp: 97.1 F (36.2 C)  Resp: 16   Outpatient Encounter Prescriptions as of 08/24/2014  Medication Sig  . atenolol (TENORMIN) 50 MG tablet Take 50 mg by mouth daily.  . Azelastine-Fluticasone 137-50 MCG/ACT SUSP Place 2 sprays into both nostrils daily.  . cetirizine (ZYRTEC) 10 MG tablet Take 1 tablet (10 mg total) by  mouth daily.  . furosemide (LASIX) 20 MG tablet Take 20 mg by mouth daily.  Marland Kitchen gabapentin (NEURONTIN) 300 MG capsule Take 300 mg by mouth at bedtime.  Marland Kitchen levothyroxine (SYNTHROID, LEVOTHROID) 88 MCG tablet Take 88 mcg by mouth daily before breakfast.  . loperamide (IMODIUM) 2 MG capsule Take 2 mg by mouth 4 (four) times daily as needed. For diarrhea.  . magnesium oxide (MAG-OX) 400 MG tablet Take 400 mg by mouth daily.  Marland Kitchen nystatin cream (MYCOSTATIN) Apply 1 application topically 2 (two) times daily.  Marland Kitchen omeprazole  (PRILOSEC) 20 MG capsule Take 20 mg by mouth daily.  . pilocarpine (SALAGEN) 5 MG tablet Take 5 mg by mouth 3 (three) times daily.  . polyvinyl alcohol (LIQUIFILM TEARS) 1.4 % ophthalmic solution Place 1 drop into both eyes 2 (two) times daily. Wait 3-5 minutes between eye drops.  . Probiotic Product (PROBIOTIC PEARLS ADVANTAGE PO) Take 1 tablet by mouth daily.  Marland Kitchen rOPINIRole (REQUIP) 1 MG tablet Take 1 mg by mouth 2 (two) times daily. 2 AM and 2 PM  . rOPINIRole (REQUIP) 1 MG tablet Take 2 mg by mouth 2 (two) times daily. 8 AM and 8 PM  . sertraline (ZOLOFT) 100 MG tablet Take 100 mg by mouth daily.     Physical Exam:  Filed Vitals:   08/24/14 0950  BP: 102/59  Pulse: 54  Temp: 97.1 F (36.2 C)  Resp: 16  Weight: 127 lb 3.2 oz (57.698 kg)    General- elderly female in no acute distress, thin/frail Head- atraumatic, normocephalic Eyes- PERRLA, EOMI, no pallor, no icterus, no discharge Neck- no lymphadenopathy, no palpable thyroid tissue, no jugular vein distension, no carotid bruit Cardiovascular- normal s1,s2, no murmurs/ rubs/ gallops Respiratory- bilateral clear to auscultation, no wheeze, no rhonchi, no crackles, no use of accessory muscles Abdomen- bowel sounds present, soft, non tender, no organomegaly, no abdominal bruits, no guarding or rigidity, no CVA tenderness Musculoskeletal- severe kyphosis with neck contracture; limited cervical ROM; able to move all 4 extremities, no spinal and paraspinal tenderness, steady gait, uses walker and wheelchair; non-pitting leg edema R>L. Neurological- no focal deficit, decreased muscle tone and strength; 3/5 to upper and lower extremities Skin- warm and dry Psychiatry- alert and oriented to person, place;, normal mood and affect    LABS REVIEWED:   01-12-14: wbc 5.1; hgb 12.2; hct 36.2; mcv 92.1; plt 217; glucose 76; bun 26; creat 0.99; k+3.9; na++141; liver normal albumin 3.4; tsh 0.266; vit b12: 1261; mag 2.2; phos 3.7; vit d 12    06-28-14: wbc 5.2; hgb 10.2; hct 31.3; mcv 96; plt 161; glucose 70; bun 18; creat 0.9; k+3.7; na++140; liver normal albumin 2.6 07-02-14: wbc 5.9; hgb 11.6; hct 33.9; mcv 96.3; plt 169;glucose 70; bun 18; creat 1.0; k+3.9; na++141; liver normal albumin 3.0; chol 223; ldl 156; trig 97; ferritin 75; tsh 3.862 08-06-14: na 138; k 3.4; glucose 66; bun 15; creat 1.0; gfr 53.78; Mg 2.1  Assessment/Plan  1. Hypothyroidism: stable; she is status post thyroidectomy; continue synthroid 88 mcg daily; last TSH is 3.862   2. Vasomotor rhinitis: is stable will continue astelin nasal spray daily and zyrtec 10 mg daily and will monitor   3. Gerd: no issues; will continue prilosec 20 mg daily   4. Anemia: stable; last hgb 11.6; will continue to monitor her status.   5. Depression: mood fluctuates; pleasant this am; will continue her zoloft 100 mg daily and will monitor her status.   6. Hypertension: blood  pressure and heart rate low; readings consistently in 50s, b/p low 100s/50s; will decreased tenormin to 25mg  daily    7. RLS: on-going issue for patient; will consider changes to requip, decrease dosage to total:  4mg /daily, 0.5 at 6am, 0.5mg  at 12pm; 1mg  at 6pm; 2mg  at 10pm. Increase her gabapentin to 300mg  po BID.    8. Sjogren's syndrome: will continue her pilocarpine 5 mg po three times daily and her oral solution daily; will not make changes and will monitor her status.   9. Edema: will begin lasix 20 mg daily; K 3.4, added daily oral potassium 20 meq daily   Westley Foots, [place] NP [facility]  Contact [phone] Monday through Friday 8am- 5pm  After hours call [phone]

## 2014-08-26 MED ORDER — ATENOLOL 50 MG PO TABS: 25.0000 mg | ORAL_TABLET | Freq: Every day | ORAL | Status: DC

## 2014-08-26 MED ORDER — ROPINIROLE HCL 1 MG PO TABS: 1.0000 mg | ORAL_TABLET | Freq: Four times a day (QID) | ORAL | Status: DC

## 2014-08-27 ENCOUNTER — Encounter: Payer: Self-pay | Admitting: Physician Assistant

## 2014-08-27 ENCOUNTER — Ambulatory Visit (INDEPENDENT_AMBULATORY_CARE_PROVIDER_SITE_OTHER): Payer: Medicare Other | Admitting: Physician Assistant

## 2014-08-27 VITALS — BP 123/77 | HR 65 | Temp 98.0°F | Resp 14 | Ht 61.0 in

## 2014-08-27 DIAGNOSIS — G2581 Restless legs syndrome: Secondary | ICD-10-CM

## 2014-08-27 DIAGNOSIS — R5383 Other fatigue: Secondary | ICD-10-CM

## 2014-08-27 DIAGNOSIS — E039 Hypothyroidism, unspecified: Secondary | ICD-10-CM

## 2014-08-27 DIAGNOSIS — R5382 Chronic fatigue, unspecified: Secondary | ICD-10-CM

## 2014-08-27 DIAGNOSIS — R5381 Other malaise: Secondary | ICD-10-CM

## 2014-08-27 LAB — CBC WITH DIFFERENTIAL/PLATELET
Basophils Absolute: 0.1 10*3/uL (ref 0.0–0.1)
Basophils Relative: 1 % (ref 0–1)
EOS ABS: 0.3 10*3/uL (ref 0.0–0.7)
Eosinophils Relative: 5 % (ref 0–5)
HCT: 35.8 % — ABNORMAL LOW (ref 36.0–46.0)
HEMOGLOBIN: 12.6 g/dL (ref 12.0–15.0)
LYMPHS ABS: 1.4 10*3/uL (ref 0.7–4.0)
Lymphocytes Relative: 24 % (ref 12–46)
MCH: 32.5 pg (ref 26.0–34.0)
MCHC: 35.2 g/dL (ref 30.0–36.0)
MCV: 92.3 fL (ref 78.0–100.0)
Monocytes Absolute: 0.5 10*3/uL (ref 0.1–1.0)
Monocytes Relative: 8 % (ref 3–12)
NEUTROS ABS: 3.5 10*3/uL (ref 1.7–7.7)
NEUTROS PCT: 62 % (ref 43–77)
PLATELETS: 223 10*3/uL (ref 150–400)
RBC: 3.88 MIL/uL (ref 3.87–5.11)
RDW: 16 % — ABNORMAL HIGH (ref 11.5–15.5)
WBC: 5.7 10*3/uL (ref 4.0–10.5)

## 2014-08-27 LAB — BASIC METABOLIC PANEL WITH GFR
BUN: 14 mg/dL (ref 6–23)
CALCIUM: 9 mg/dL (ref 8.4–10.5)
CO2: 22 mEq/L (ref 19–32)
Chloride: 105 mEq/L (ref 96–112)
Creat: 1.07 mg/dL (ref 0.50–1.10)
GFR, EST AFRICAN AMERICAN: 52 mL/min — AB
GFR, EST NON AFRICAN AMERICAN: 45 mL/min — AB
GLUCOSE: 91 mg/dL (ref 70–99)
POTASSIUM: 4.5 meq/L (ref 3.5–5.3)
Sodium: 138 mEq/L (ref 135–145)

## 2014-08-27 LAB — IRON AND TIBC
%SAT: 12 % — AB (ref 20–55)
Iron: 41 ug/dL — ABNORMAL LOW (ref 42–145)
TIBC: 330 ug/dL (ref 250–470)
UIBC: 289 ug/dL (ref 125–400)

## 2014-08-27 NOTE — Patient Instructions (Signed)
Please obtain labs.  I will call you with your results.  I will try to contact Ok Edwards, NP to discuss continuity of care between our department and hers.  We will arrange follow-up based on lab results.  Please continue medications as directed.

## 2014-08-27 NOTE — Progress Notes (Deleted)
Patient presents to clinic today c/o ***.   Past Medical History  Diagnosis Date  . Arthritis   . History of chicken pox   . Depression     husband died 08-28-11  . Glaucoma   . Thyroid disease   . Hypertension   . Hypokalemia   . Renal disorder   . Altered mental state   . Blind   . Macular degeneration   . Abrasion of arm, left 02/04/2013  . Compression fracture of T12 vertebra 03/21/2013  . Debility 03/21/2013  . Nasal congestion 03/21/2013  . Vasomotor rhinitis 03/21/2013  . Abnormal LFTs 03/21/2013  . Dental abscess 06/06/2013  . Thrush 08/02/2013  . Paronychia 10/11/2013  . Osteoarthritis of ankle and foot 01/12/2014  . RLS (restless legs syndrome) 01/12/2014  . Sacral decubitus ulcer, stage III 08/06/2012  . C. difficile diarrhea 09/12/2012    Current Outpatient Prescriptions on File Prior to Visit  Medication Sig Dispense Refill  . atenolol (TENORMIN) 50 MG tablet Take 0.5 tablets (25 mg total) by mouth daily.  90 tablet  11  . Azelastine-Fluticasone 137-50 MCG/ACT SUSP Place 2 sprays into both nostrils daily.      . cetirizine (ZYRTEC) 10 MG tablet Take 1 tablet (10 mg total) by mouth daily.  30 tablet  0  . furosemide (LASIX) 20 MG tablet Take 20 mg by mouth daily as needed.       . gabapentin (NEURONTIN) 300 MG capsule Take 300 mg by mouth 2 (two) times daily.       Marland Kitchen levothyroxine (SYNTHROID, LEVOTHROID) 88 MCG tablet Take 88 mcg by mouth daily before breakfast.      . loperamide (IMODIUM) 2 MG capsule Take 2 mg by mouth 4 (four) times daily as needed. For diarrhea.      . magnesium oxide (MAG-OX) 400 MG tablet Take 400 mg by mouth daily.      Marland Kitchen omeprazole (PRILOSEC) 20 MG capsule Take 20 mg by mouth daily.      . pilocarpine (SALAGEN) 5 MG tablet Take 5 mg by mouth 3 (three) times daily.      . polyvinyl alcohol (LIQUIFILM TEARS) 1.4 % ophthalmic solution Place 1 drop into both eyes 2 (two) times daily. Wait 3-5 minutes between eye drops.      Marland Kitchen rOPINIRole (REQUIP) 1 MG  tablet Take 1 tablet (1 mg total) by mouth 4 (four) times daily. Take 0.5 mg at 6am; 0.5 mg 12pm; 1 mg 6pm 2 mg 10 pm.  120 tablet  11  . sertraline (ZOLOFT) 100 MG tablet Take 100 mg by mouth daily.       No current facility-administered medications on file prior to visit.    Allergies  Allergen Reactions  . Codeine Other (See Comments)    insomnia  . Doxycycline Diarrhea and Other (See Comments)    Hallucinations  . Fentanyl Hives and Rash  . Lisinopril Nausea And Vomiting, Rash and Hypertension    Family History  Problem Relation Age of Onset  . Other Father     Killed in war  . Other Mother     Deceased, 62  . Breast cancer Daughter   . Hypertension Son   . Hyperlipidemia Son     History   Social History  . Marital Status: Widowed    Spouse Name: N/A    Number of Children: N/A  . Years of Education: N/A   Social History Main Topics  . Smoking status: Never Smoker   .  Smokeless tobacco: Never Used  . Alcohol Use: No  . Drug Use: No  . Sexual Activity: No   Other Topics Concern  . None   Social History Narrative   She is retired from working in a [employer].   Originally from [place] and immigrated from [place] in 1950   She was living in [place] until 2012 with her husband, until he passed away and then moved here.    Review of Systems - See HPI.  All other ROS are negative.  BP 123/77  Pulse 65  Temp(Src) 98 F (36.7 C) (Oral)  Resp 14  Ht 5\' 1"  (1.549 m)  SpO2 97%  Physical Exam  Recent Results (from the past 2160 hour(s))  CBC AND DIFFERENTIAL     Status: Abnormal   Collection Time    06/28/14 12:00 AM      Result Value Ref Range   Hemoglobin 10.2 (*) 12.0 - 16.0 g/dL   HCT 31 (*) 36 - 46 %   Platelets 161  150 - 399 K/L   WBC 5.2    BASIC METABOLIC PANEL     Status: None   Collection Time    06/28/14 12:00 AM      Result Value Ref Range   Glucose 70     BUN 18  4 - 21 mg/dL   Creatinine 0.9  0.5 - 1.1 mg/dL   Potassium 3.7  3.4 - 5.3  mmol/L   Sodium 140  137 - 147 mmol/L  HEPATIC FUNCTION PANEL     Status: Abnormal   Collection Time    06/28/14 12:00 AM      Result Value Ref Range   Alkaline Phosphatase 126 (*) 25 - 125 U/L   ALT 6 (*) 7 - 35 U/L   AST 9 (*) 13 - 35 U/L   Bilirubin, Total 0.5    CBC AND DIFFERENTIAL     Status: Abnormal   Collection Time    07/02/14 12:00 AM      Result Value Ref Range   Hemoglobin 11.6 (*) 12.0 - 16.0 g/dL   HCT 34 (*) 36 - 46 %   Platelets 169  150 - 399 K/L   WBC 5.9    BASIC METABOLIC PANEL     Status: None   Collection Time    07/02/14 12:00 AM      Result Value Ref Range   Glucose 72     BUN 18  4 - 21 mg/dL   Creatinine 1.0  0.5 - 1.1 mg/dL   Potassium 3.9  3.4 - 5.3 mmol/L   Sodium 141  137 - 147 mmol/L  LIPID PANEL     Status: None   Collection Time    07/02/14 12:00 AM      Result Value Ref Range   Triglycerides 97  40 - 160 mg/dL   LDL Cholesterol 156    HEPATIC FUNCTION PANEL     Status: Abnormal   Collection Time    07/02/14 12:00 AM      Result Value Ref Range   Alkaline Phosphatase 149 (*) 25 - 125 U/L   ALT 7  7 - 35 U/L   AST 10 (*) 13 - 35 U/L   Bilirubin, Total 0.4    TSH     Status: None   Collection Time    07/02/14 12:00 AM      Result Value Ref Range   TSH 3.86  0.41 - 5.90 uIU/mL  Assessment/Plan: No problem-specific assessment & plan notes found for this encounter.

## 2014-08-27 NOTE — Progress Notes (Signed)
Pre visit review using our clinic review tool, if applicable. No additional management support is needed unless otherwise documented below in the visit note/SLS  

## 2014-08-28 LAB — TSH: TSH: 2.239 u[IU]/mL (ref 0.350–4.500)

## 2014-08-29 NOTE — Progress Notes (Signed)
This encounter was created in error - please disregard.

## 2014-08-29 NOTE — Progress Notes (Signed)
Patient ID: [name] [name], female   DOB: 1919-11-13, 80 y.o.   MRN: [id number]     [name] [name] place     Code Status: DNR    Allergies   Allergen  Reactions   .  Codeine  Other (See Comments)       insomnia   .  Doxycycline  Diarrhea and Other (See Comments)       Hallucinations   .  Fentanyl  Hives and Rash   .  Lisinopril  Nausea And Vomiting, Rash and Hypertension     Chief Complaint: Medical Management of Chronic Health Conditions  HPI:   [name] [name] is a 80 yr old female being seen today for routine visit. She has multiple medical conditions. Staff denies any complaints or concerns regarding patient. The patients primary concern is regarding her restless leg syndrome. She does not feel her legs are worsening and feel they are actually doing fine, however she desires to be awakened at night to take her requip. She denies joint pain, burning to legs/feet, muscle fatigue. She denies chest pain or shortness of breath.   Review of Systems:  Constitutional: Negative for fever, chills, weight loss, malaise/fatigue and diaphoresis.   Respiratory: Negative for cough, sputum production, shortness of breath and wheezing.   Cardiovascular: Negative for chest pain, palpitations, orthopnea and leg swelling.  Gastrointestinal: Negative for heartburn, nausea, vomiting, abdominal pain, diarrhea and constipation.  Genitourinary: Negative for dysuria, urgency, frequency, hematuria and flank pain.  Musculoskeletal: Positive for kyphosis, neck contracture, restless leg. Negative for back pain, falls, and myalgias.  Skin: Negative for itching and rash.  Neurological: Negative for weakness,dizziness, tingling, focal weakness and headaches.  Psychiatric/Behavioral: Negative for depression and memory loss. The patient is not nervous/anxious.       Past Medical History   Diagnosis  Date   .  Arthritis     .  History of chicken pox     .  Depression         husband died Sep 10, 2011   .  Glaucoma       .  Thyroid disease     .  Hypertension     .  Hypokalemia     .  Renal disorder     .  Altered mental state     .  Blind     .  Macular degeneration     .  Abrasion of arm, left  02/04/2013   .  Compression fracture of T12 vertebra  03/21/2013   .  Debility  03/21/2013   .  Nasal congestion  03/21/2013   .  Vasomotor rhinitis  03/21/2013   .  Abnormal LFTs  03/21/2013   .  Dental abscess  06/06/2013   .  Thrush  08/02/2013   .  Paronychia  10/11/2013   .  Osteoarthritis of ankle and foot  01/12/2014   .  RLS (restless legs syndrome)  01/12/2014   .  Sacral decubitus ulcer, stage III  08/06/2012   .  C. difficile diarrhea  09/12/2012        Past Surgical History   Procedure  Laterality  Date   .  Revision total knee arthroplasty           right knee   .  Back surgery       .  Cataract extraction, bilateral       .  Thyroidectomy       .  Abdominal hysterectomy  Social History:   reports that she has never smoked. She has never used smokeless tobacco. She reports that she does not drink alcohol or use illicit drugs.      Family History   Problem  Relation  Age of Onset   .  Other  Father         Killed in war   .  Other  Mother         Deceased, 64   .  Breast cancer  Daughter     .  Hypertension  Son     .  Hyperlipidemia  Son        Last Vitals:  Filed Vitals:     08/24/14 0950   BP:  102/59   Pulse:  54   Temp:  97.1 F (36.2 C)   Resp:  16      Outpatient Encounter Prescriptions as of 08/24/2014   Medication  Sig   .  atenolol (TENORMIN) 50 MG tablet  Take 50 mg by mouth daily.   .  Azelastine-Fluticasone 137-50 MCG/ACT SUSP  Place 2 sprays into both nostrils daily.   .  cetirizine (ZYRTEC) 10 MG tablet  Take 1 tablet (10 mg total) by mouth daily.   .  furosemide (LASIX) 20 MG tablet  Take 20 mg by mouth daily.   Marland Kitchen  gabapentin (NEURONTIN) 300 MG capsule  Take 300 mg by mouth at bedtime.   Marland Kitchen  levothyroxine (SYNTHROID, LEVOTHROID) 88 MCG tablet  Take 88  mcg by mouth daily before breakfast.   .  loperamide (IMODIUM) 2 MG capsule  Take 2 mg by mouth 4 (four) times daily as needed. For diarrhea.   .  magnesium oxide (MAG-OX) 400 MG tablet  Take 400 mg by mouth daily.   Marland Kitchen  nystatin cream (MYCOSTATIN)  Apply 1 application topically 2 (two) times daily.   Marland Kitchen  omeprazole (PRILOSEC) 20 MG capsule  Take 20 mg by mouth daily.   .  pilocarpine (SALAGEN) 5 MG tablet  Take 5 mg by mouth 3 (three) times daily.   .  polyvinyl alcohol (LIQUIFILM TEARS) 1.4 % ophthalmic solution  Place 1 drop into both eyes 2 (two) times daily. Wait 3-5 minutes between eye drops.   .  Probiotic Product (PROBIOTIC PEARLS ADVANTAGE PO)  Take 1 tablet by mouth daily.   Marland Kitchen  rOPINIRole (REQUIP) 1 MG tablet  Take 1 mg by mouth 2 (two) times daily. 2 AM and 2 PM   .  rOPINIRole (REQUIP) 1 MG tablet  Take 2 mg by mouth 2 (two) times daily. 8 AM and 8 PM   .  sertraline (ZOLOFT) 100 MG tablet  Take 100 mg by mouth daily.      Physical Exam:    Filed Vitals:     08/24/14 0950   BP:  102/59   Pulse:  54   Temp:  97.1 F (36.2 C)   Resp:  16   Weight:  127 lb 3.2 oz (57.698 kg)     General- elderly female in no acute distress, thin/frail Head- atraumatic, normocephalic Eyes- PERRLA, EOMI, no pallor, no icterus, no discharge Neck- no lymphadenopathy, no palpable thyroid tissue, no jugular vein distension, no carotid bruit Cardiovascular- normal s1,s2, no murmurs/ rubs/ gallops Respiratory- bilateral clear to auscultation, no wheeze, no rhonchi, no crackles, no use of accessory muscles Abdomen- bowel sounds present, soft, non tender, no organomegaly, no abdominal bruits, no guarding or rigidity, no CVA tenderness Musculoskeletal- severe  kyphosis with neck contracture; limited cervical ROM; able to move all 4 extremities, no spinal and paraspinal tenderness, steady gait, uses walker and wheelchair; non-pitting leg edema R>L. Neurological- no focal deficit, decreased muscle tone and  strength; 3/5 to upper and lower extremities Skin- warm and dry Psychiatry- alert and oriented to person, place;, normal mood and affect    LABS REVIEWED:   01-12-14: wbc 5.1; hgb 12.2; hct 36.2; mcv 92.1; plt 217; glucose 76; bun 26; creat 0.99; k+3.9; na++141; liver normal albumin 3.4; tsh 0.266; vit b12: 1261; mag 2.2; phos 3.7; vit d 12   06-28-14: wbc 5.2; hgb 10.2; hct 31.3; mcv 96; plt 161; glucose 70; bun 18; creat 0.9; k+3.7; na++140; liver normal albumin 2.6 07-02-14: wbc 5.9; hgb 11.6; hct 33.9; mcv 96.3; plt 169;glucose 70; bun 18; creat 1.0; k+3.9; na++141; liver normal albumin 3.0; chol 223; ldl 156; trig 97; ferritin 75; tsh 3.862 08-06-14: na 138; k 3.4; glucose 66; bun 15; creat 1.0; gfr 53.78; Mg 2.1  Assessment/Plan  1. Hypothyroidism: stable; she is status post thyroidectomy; continue synthroid 88 mcg daily; last TSH is 3.862   2. Vasomotor rhinitis: is stable will continue astelin nasal spray daily and zyrtec 10 mg daily and will monitor   3. Gerd: no issues; will continue prilosec 20 mg daily    4. Anemia: stable; last hgb 11.6; will continue to monitor her status.   5. Depression: mood fluctuates; pleasant this am; will continue her zoloft 100 mg daily and will monitor her status.   6. Hypertension: blood pressure and heart rate low; readings consistently in 50s, b/p low 100s/50s; will decreased tenormin to 25mg  daily    7. RLS: on-going issue for patient; will consider changes to requip, decrease dosage to total:  4mg /daily, 0.5 at 6am, 0.5mg  at 12pm; 1mg  at 6pm; 2mg  at 10pm. Increase her gabapentin to 300mg  po BID.    8. Sjogren's syndrome: will continue her pilocarpine 5 mg po three times daily and her oral solution daily; will not make changes and will monitor her status.   9. Edema: will begin lasix 20 mg daily; K 3.4, added daily oral potassium 20 meq daily   Westley Foots, [place] NP [facility]   Contact [phone]  Monday through Friday 8am- 5pm   After hours call [phone]

## 2014-08-29 NOTE — Addendum Note (Signed)
Addended by: Gerlene Fee on: 08/29/2014 10:55 PM   Modules accepted: Level of Service, SmartSet

## 2014-09-01 LAB — VITAMIN D 1,25 DIHYDROXY
VITAMIN D 1, 25 (OH) TOTAL: 81 pg/mL — AB (ref 18–72)
VITAMIN D2 1, 25 (OH): 41 pg/mL
VITAMIN D3 1, 25 (OH): 40 pg/mL

## 2014-09-08 NOTE — Progress Notes (Signed)
Patient presents to clinic today to transfer care.  Patient also followed by Gerontology who visits her nursing home biweekly for evaluation.  Patient's family wishes to review patient's medications to get clarification on what each medication is for.  Patient and family c/o continued restless leg syndrome despite use of Requip. Unbeknownst to family, Gerontology increased the requip dosage this week.  Family does endorse patient has history of iron deficiency but is not currently receiving treatment.   Past Medical History  Diagnosis Date  . Arthritis   . History of chicken pox   . Depression     husband died September 12, 2011  . Glaucoma   . Thyroid disease   . Hypertension   . Hypokalemia   . Renal disorder   . Altered mental state   . Blind   . Macular degeneration   . Abrasion of arm, left 02/04/2013  . Compression fracture of T12 vertebra 03/21/2013  . Debility 03/21/2013  . Nasal congestion 03/21/2013  . Vasomotor rhinitis 03/21/2013  . Abnormal LFTs 03/21/2013  . Dental abscess 06/06/2013  . Thrush 08/02/2013  . Paronychia 10/11/2013  . Osteoarthritis of ankle and foot 01/12/2014  . RLS (restless legs syndrome) 01/12/2014  . Sacral decubitus ulcer, stage III 08/06/2012  . C. difficile diarrhea 09/12/2012    Past Surgical History  Procedure Laterality Date  . Revision total knee arthroplasty      right knee  . Back surgery    . Cataract extraction, bilateral    . Thyroidectomy    . Abdominal hysterectomy      Current Outpatient Prescriptions on File Prior to Visit  Medication Sig Dispense Refill  . atenolol (TENORMIN) 50 MG tablet Take 0.5 tablets (25 mg total) by mouth daily.  90 tablet  11  . Azelastine-Fluticasone 137-50 MCG/ACT SUSP Place 2 sprays into both nostrils daily.      . cetirizine (ZYRTEC) 10 MG tablet Take 1 tablet (10 mg total) by mouth daily.  30 tablet  0  . furosemide (LASIX) 20 MG tablet Take 20 mg by mouth daily as needed.       . gabapentin (NEURONTIN) 300 MG  capsule Take 300 mg by mouth 2 (two) times daily.       Marland Kitchen levothyroxine (SYNTHROID, LEVOTHROID) 88 MCG tablet Take 88 mcg by mouth daily before breakfast.      . loperamide (IMODIUM) 2 MG capsule Take 2 mg by mouth 4 (four) times daily as needed. For diarrhea.      . magnesium oxide (MAG-OX) 400 MG tablet Take 400 mg by mouth daily.      Marland Kitchen omeprazole (PRILOSEC) 20 MG capsule Take 20 mg by mouth daily.      . pilocarpine (SALAGEN) 5 MG tablet Take 5 mg by mouth 3 (three) times daily.      . polyvinyl alcohol (LIQUIFILM TEARS) 1.4 % ophthalmic solution Place 1 drop into both eyes 2 (two) times daily. Wait 3-5 minutes between eye drops.      Marland Kitchen rOPINIRole (REQUIP) 1 MG tablet Take 1 tablet (1 mg total) by mouth 4 (four) times daily. Take 0.5 mg at 6am; 0.5 mg 12pm; 1 mg 6pm 2 mg 10 pm.  120 tablet  11  . sertraline (ZOLOFT) 100 MG tablet Take 100 mg by mouth daily.       No current facility-administered medications on file prior to visit.    Allergies  Allergen Reactions  . Codeine Other (See Comments)    insomnia  .  Doxycycline Diarrhea and Other (See Comments)    Hallucinations  . Fentanyl Hives and Rash  . Lisinopril Nausea And Vomiting, Rash and Hypertension    Family History  Problem Relation Age of Onset  . Other Father     Killed in war  . Other Mother     Deceased, 52  . Breast cancer Daughter   . Hypertension Son   . Hyperlipidemia Son     History   Social History  . Marital Status: Widowed    Spouse Name: N/A    Number of Children: N/A  . Years of Education: N/A   Occupational History  . Not on file.   Social History Main Topics  . Smoking status: Never Smoker   . Smokeless tobacco: Never Used  . Alcohol Use: No  . Drug Use: No  . Sexual Activity: No   Other Topics Concern  . Not on file   Social History Narrative   She is retired from working in a [employer].   Originally from [place] and immigrated from [place] in 1950   She was living in [place] until 2012  with her husband, until he passed away and then moved here.    ROS Unable to obtain due to patient's dementia  BP 123/77  Pulse 65  Temp(Src) 98 F (36.7 C) (Oral)  Resp 14  Ht 5' 1"  (1.549 m)  SpO2 97%  Physical Exam  Vitals reviewed. HENT:  Head: Normocephalic and atraumatic.  Eyes: Conjunctivae are normal.  Cardiovascular: Normal rate, regular rhythm and normal heart sounds.   Pulmonary/Chest: Effort normal and breath sounds normal.  Neurological: She is alert.  Skin: Skin is warm and dry. No rash noted.    Recent Results (from the past 2160 hour(s))  CBC AND DIFFERENTIAL     Status: Abnormal   Collection Time    06/28/14 12:00 AM      Result Value Ref Range   Hemoglobin 10.2 (*) 12.0 - 16.0 g/dL   HCT 31 (*) 36 - 46 %   Platelets 161  150 - 399 K/L   WBC 5.2    BASIC METABOLIC PANEL     Status: None   Collection Time    06/28/14 12:00 AM      Result Value Ref Range   Glucose 70     BUN 18  4 - 21 mg/dL   Creatinine 0.9  0.5 - 1.1 mg/dL   Potassium 3.7  3.4 - 5.3 mmol/L   Sodium 140  137 - 147 mmol/L  HEPATIC FUNCTION PANEL     Status: Abnormal   Collection Time    06/28/14 12:00 AM      Result Value Ref Range   Alkaline Phosphatase 126 (*) 25 - 125 U/L   ALT 6 (*) 7 - 35 U/L   AST 9 (*) 13 - 35 U/L   Bilirubin, Total 0.5    CBC AND DIFFERENTIAL     Status: Abnormal   Collection Time    07/02/14 12:00 AM      Result Value Ref Range   Hemoglobin 11.6 (*) 12.0 - 16.0 g/dL   HCT 34 (*) 36 - 46 %   Platelets 169  150 - 399 K/L   WBC 5.9    BASIC METABOLIC PANEL     Status: None   Collection Time    07/02/14 12:00 AM      Result Value Ref Range   Glucose 72     BUN 18  4 - 21 mg/dL   Creatinine 1.0  0.5 - 1.1 mg/dL   Potassium 3.9  3.4 - 5.3 mmol/L   Sodium 141  137 - 147 mmol/L  LIPID PANEL     Status: None   Collection Time    07/02/14 12:00 AM      Result Value Ref Range   Triglycerides 97  40 - 160 mg/dL   LDL Cholesterol 156    HEPATIC  FUNCTION PANEL     Status: Abnormal   Collection Time    07/02/14 12:00 AM      Result Value Ref Range   Alkaline Phosphatase 149 (*) 25 - 125 U/L   ALT 7  7 - 35 U/L   AST 10 (*) 13 - 35 U/L   Bilirubin, Total 0.4    TSH     Status: None   Collection Time    07/02/14 12:00 AM      Result Value Ref Range   TSH 3.86  0.41 - 5.90 uIU/mL  TSH     Status: None   Collection Time    08/27/14  3:55 PM      Result Value Ref Range   TSH 2.239  0.350 - 4.500 uIU/mL  IRON AND TIBC     Status: Abnormal   Collection Time    08/27/14  3:55 PM      Result Value Ref Range   Iron 41 (*) 42 - 145 ug/dL   UIBC 289  125 - 400 ug/dL   TIBC 330  250 - 470 ug/dL   %SAT 12 (*) 20 - 55 %  VITAMIN D 1,25 DIHYDROXY     Status: Abnormal   Collection Time    08/27/14  3:55 PM      Result Value Ref Range   Vitamin D 1, 25 (OH)2 Total 81 (*) 18 - 72 pg/mL   Vitamin D3 1, 25 (OH)2 40     Vitamin D2 1, 25 (OH)2 41     Comment: Vitamin D3, 1,25(OH)2 indicates both endogenous     production and supplementation.  Vitamin D2, 1,25(OH)2     is an indicator of exogeous sources, such as diet or     supplementation.  Interpretation and therapy are based     on measurement of Vitamin D,1,25(OH)2, Total.     This test was developed and its performance     characteristics have been determined by [facility], [place], [place].     Performance characteristics refer to the     analytical performance of the test.  BASIC METABOLIC PANEL WITH GFR     Status: Abnormal   Collection Time    08/27/14  3:55 PM      Result Value Ref Range   Sodium 138  135 - 145 mEq/L   Potassium 4.5  3.5 - 5.3 mEq/L   Chloride 105  96 - 112 mEq/L   CO2 22  19 - 32 mEq/L   Glucose, Bld 91  70 - 99 mg/dL   BUN 14  6 - 23 mg/dL   Creat 1.07  0.50 - 1.10 mg/dL   Calcium 9.0  8.4 - 10.5 mg/dL   GFR, Est African American 52 (*)    GFR, Est Non African American 45 (*)    Comment:       The estimated GFR is a  calculation valid for adults (>=14 years old)     that uses the  CKD-EPI algorithm to adjust for age and sex. It is       not to be used for children, pregnant women, hospitalized patients,        patients on dialysis, or with rapidly changing kidney function.     According to the NKDEP, eGFR >89 is normal, 60-89 shows mild     impairment, 30-59 shows moderate impairment, 15-29 shows severe     impairment and <15 is ESRD.        CBC WITH DIFFERENTIAL     Status: Abnormal   Collection Time    08/27/14  3:55 PM      Result Value Ref Range   WBC 5.7  4.0 - 10.5 K/uL   RBC 3.88  3.87 - 5.11 MIL/uL   Hemoglobin 12.6  12.0 - 15.0 g/dL   HCT 35.8 (*) 36.0 - 46.0 %   MCV 92.3  78.0 - 100.0 fL   MCH 32.5  26.0 - 34.0 pg   MCHC 35.2  30.0 - 36.0 g/dL   RDW 16.0 (*) 11.5 - 15.5 %   Platelets 223  150 - 400 K/uL   Neutrophils Relative % 62  43 - 77 %   Neutro Abs 3.5  1.7 - 7.7 K/uL   Lymphocytes Relative 24  12 - 46 %   Lymphs Abs 1.4  0.7 - 4.0 K/uL   Monocytes Relative 8  3 - 12 %   Monocytes Absolute 0.5  0.1 - 1.0 K/uL   Eosinophils Relative 5  0 - 5 %   Eosinophils Absolute 0.3  0.0 - 0.7 K/uL   Basophils Relative 1  0 - 1 %   Basophils Absolute 0.1  0.0 - 0.1 K/uL   Smear Review Criteria for review not met      Assessment/Plan: RLS (restless legs syndrome) Will check iron level as low iron will cause RLS symptoms.  Will treat based on results.

## 2014-09-08 NOTE — Assessment & Plan Note (Signed)
Will check iron level as low iron will cause RLS symptoms.  Will treat based on results.

## 2014-09-09 ENCOUNTER — Telehealth: Payer: Self-pay | Admitting: Physician Assistant

## 2014-09-09 NOTE — Telephone Encounter (Signed)
Caller name: Bev  Call back number:[phone]   Reason for call:  Calling back in regards to lab results.

## 2014-09-10 NOTE — Telephone Encounter (Signed)
DUPLICATE NOTE-See Results.

## 2014-09-13 ENCOUNTER — Non-Acute Institutional Stay (SKILLED_NURSING_FACILITY): Payer: Medicare Other | Admitting: Adult Health

## 2014-09-13 ENCOUNTER — Encounter: Payer: Self-pay | Admitting: Adult Health

## 2014-09-13 DIAGNOSIS — I10 Essential (primary) hypertension: Secondary | ICD-10-CM

## 2014-09-13 DIAGNOSIS — M3500 Sicca syndrome, unspecified: Secondary | ICD-10-CM

## 2014-09-13 DIAGNOSIS — K219 Gastro-esophageal reflux disease without esophagitis: Secondary | ICD-10-CM

## 2014-09-13 DIAGNOSIS — G2581 Restless legs syndrome: Secondary | ICD-10-CM

## 2014-09-13 DIAGNOSIS — F418 Other specified anxiety disorders: Secondary | ICD-10-CM

## 2014-09-13 DIAGNOSIS — R609 Edema, unspecified: Secondary | ICD-10-CM

## 2014-09-13 DIAGNOSIS — E039 Hypothyroidism, unspecified: Secondary | ICD-10-CM

## 2014-09-13 DIAGNOSIS — R634 Abnormal weight loss: Secondary | ICD-10-CM

## 2014-09-13 NOTE — Progress Notes (Signed)
[name] [name]    Code Status: DNR  Allergies  Allergen Reactions  . Codeine Other (See Comments)    insomnia  . Doxycycline Diarrhea and Other (See Comments)    Hallucinations  . Fentanyl Hives and Rash  . Lisinopril Nausea And Vomiting, Rash and Hypertension    Chief Complaint: Medical Management of Chronic Health Conditions  HPI:  [name] is a 80 yr old female being seen today for a routine visit. Staff denies any concerns. Pt remains active with ADLs. She does report her Restless leg syndrome has improved since changing her medications. She denies chest pain, shortness of breath, and pain. She has a 5lb weight loss in a month. She is eating ok, however her appetite has decreased. She denies abdominal pain, n/v/d, or any changes in bowels.   Review of Systems:  Constitutional: Positive 5lb weight loss.  Negative for fever, chills, malaise/fatigue and diaphoresis.   Respiratory: Negative for cough, sputum production, shortness of breath and wheezing.   Cardiovascular: Negative for chest pain, palpitations, orthopnea and leg swelling.  Gastrointestinal: Negative for heartburn, nausea, vomiting, abdominal pain, diarrhea and constipation.  Genitourinary: Negative for dysuria, urgency, frequency, hematuria and flank pain.  Musculoskeletal: Positive back pain, kyphosis, and RLS.   Skin: Negative for itching and rash.  Neurological: Negative for weakness,dizziness, tingling, focal weakness and headaches.  Psychiatric/Behavioral: Negative for depression and memory loss. The patient is not nervous/anxious.     Past Medical History  Diagnosis Date  . Arthritis   . History of chicken pox   . Depression     husband died Sep 05, 2011  . Glaucoma   . Thyroid disease   . Hypertension   . Hypokalemia   . Renal disorder   . Altered mental state   . Blind   . Macular degeneration   . Abrasion of arm, left 02/04/2013  . Compression fracture of T12 vertebra 03/21/2013  . Debility  03/21/2013  . Nasal congestion 03/21/2013  . Vasomotor rhinitis 03/21/2013  . Abnormal LFTs 03/21/2013  . Dental abscess 06/06/2013  . Thrush 08/02/2013  . Paronychia 10/11/2013  . Osteoarthritis of ankle and foot 01/12/2014  . RLS (restless legs syndrome) 01/12/2014  . Sacral decubitus ulcer, stage III 08/06/2012  . C. difficile diarrhea 09/12/2012   Past Surgical History  Procedure Laterality Date  . Revision total knee arthroplasty      right knee  . Back surgery    . Cataract extraction, bilateral    . Thyroidectomy    . Abdominal hysterectomy     Social History:   reports that she has never smoked. She has never used smokeless tobacco. She reports that she does not drink alcohol or use illicit drugs.  Family History  Problem Relation Age of Onset  . Other Father     Killed in war  . Other Mother     Deceased, 27  . Breast cancer Daughter   . Hypertension Son   . Hyperlipidemia Son     Medications:   Medication List       This list is accurate as of: 09/13/14 10:51 AM.  Always use your most recent med list.               atenolol 50 MG tablet  Commonly known as:  TENORMIN  Take 0.5 tablets (25 mg total) by mouth daily.     Azelastine-Fluticasone 137-50 MCG/ACT Susp  [name] 2 sprays into both nostrils daily.     benzocaine 10 %  mucosal gel  Commonly known as:  ORAJEL  Use as directed 1 application in the mouth or throat as needed for mouth pain.     cetirizine 10 MG tablet  Commonly known as:  ZYRTEC  Take 1 tablet (10 mg total) by mouth daily.     furosemide 20 MG tablet  Commonly known as:  LASIX  Take 20 mg by mouth daily as needed.     gabapentin 300 MG capsule  Commonly known as:  NEURONTIN  Take 300 mg by mouth 2 (two) times daily.     levothyroxine 88 MCG tablet  Commonly known as:  SYNTHROID, LEVOTHROID  Take 88 mcg by mouth daily before breakfast.     loperamide 2 MG capsule  Commonly known as:  IMODIUM  Take 2 mg by mouth 4 (four) times daily  as needed. For diarrhea.     magnesium oxide 400 MG tablet  Commonly known as:  MAG-OX  Take 400 mg by mouth daily.     nystatin 100000 UNIT/ML suspension  Commonly known as:  MYCOSTATIN  Take 5 mLs by mouth 2 (two) times daily as needed (Thrush).     nystatin powder  Commonly known as:  MYCOSTATIN  Apply topically 2 (two) times daily as needed.     nystatin cream  Commonly known as:  MYCOSTATIN  Apply 1 application topically 2 (two) times daily as needed for dry skin (Rash).     omeprazole 20 MG capsule  Commonly known as:  PRILOSEC  Take 20 mg by mouth daily.     pilocarpine 5 MG tablet  Commonly known as:  SALAGEN  Take 5 mg by mouth 3 (three) times daily.     polyvinyl alcohol 1.4 % ophthalmic solution  Commonly known as:  LIQUIFILM TEARS  [name] 1 drop into both eyes 2 (two) times daily. Wait 3-5 minutes between eye drops.     potassium chloride SA 20 MEQ tablet  Commonly known as:  K-DUR,KLOR-CON  Take 20 mEq by mouth once.     rOPINIRole 1 MG tablet  Commonly known as:  REQUIP  Take 1 tablet (1 mg total) by mouth 4 (four) times daily. Take 0.5 mg at 6am; 0.5 mg 12pm; 1 mg 6pm 2 mg 10 pm.     saccharomyces boulardii 250 MG capsule  Commonly known as:  FLORASTOR  Take 250 mg by mouth 2 (two) times daily.     sertraline 100 MG tablet  Commonly known as:  ZOLOFT  Take 100 mg by mouth daily.          Physical Exam:  Filed Vitals:   09/13/14 1039  BP: 128/74  Pulse: 84  Temp: 98.1 F (36.7 C)  Resp: 18  Weight: 123 lb 12.8 oz (56.155 kg)    General- elderly female in no acute distress; frail Neck- no lymphadenopathy, no thyromegaly, no jugular vein distension Cardiovascular- normal s1,s2, no murmurs/ rubs/ gallops; no leg edema Respiratory- bilateral clear to auscultation, no wheeze, no rhonchi, no crackles, no use of accessory muscles Abdomen- bowel sounds present, soft, non tender, no organomegaly, no abdominal bruits, no guarding or  rigidity Musculoskeletal- able to move all 4 extremities, kyphosis; wheelchair assist; decreased ROM Neurological- no focal deficit, decreased mm strength 3/5 Skin- warm and dry Psychiatry- alert and oriented to person, [name] and time, normal mood and affect   LABS REVIEWED:   01-12-14: wbc 5.1; hgb 12.2; hct 36.2; mcv 92.1; plt 217; glucose 76; bun 26; creat 0.99; k+3.9;  na++141; liver normal albumin 3.4; tsh 0.266; vit b12: 1261; mag 2.2; phos 3.7; vit d 12   06-28-14: wbc 5.2; hgb 10.2; hct 31.3; mcv 96; plt 161; glucose 70; bun 18; creat 0.9; k+3.7; na++140; liver normal albumin 2.6 07-02-14: wbc 5.9; hgb 11.6; hct 33.9; mcv 96.3; plt 169;glucose 70; bun 18; creat 1.0; k+3.9; na++141; liver normal albumin 3.0; chol 223; ldl 156; trig 97; ferritin 75; tsh 3.862 08-06-14: na 138; k 3.4; glucose 66; bun 15; creat 1.0; gfr 53.78; Mg 2.1 08-24-14: tsh 2.48 09-06-14: na 140; k 4.1; glucose 7; bun 18; creat 1.1;     Assessment/Plan  1. Hypothyroidism: stable; she is status post thyroidectomy; continue synthroid 88 mcg daily; last TSH is 2.48   2. Vasomotor rhinitis: is stable; will continue astelin nasal spray daily and zyrtec 10 mg daily and will monitor   3. Gerd: no issues; will continue prilosec 20 mg daily    4. Anemia: stable; last hgb 11.6; will continue to monitor her status.   5. Depression: mood fluctuates; pleasant this am; will continue her zoloft 100 mg daily and will monitor her status.   6. Hypertension: improved; will continue tenormin 25mg  daily; continue to monitor  7. RLS: this has improved for patient; did see PCP for changes in medications; no changes at this time; will continue Requip 4mg /total daily, 0.5 at 6am, 0.5mg  at 12pm; 1mg  at 6pm; 2mg  at 10pm. Increase her gabapentin to 300mg  po BID.    8. Sjogren's syndrome: stable; will continue her pilocarpine 5 mg po three times daily and her oral solution daily; will not make changes and will monitor her status.   9.  Edema: improved; will continue lasix 20 mg daily; K 4.1, continue daily oral potassium 20 meq daily   10. Weight loss: she has had a documented 5lb weight loss since last month, will started her on fortified foods and magic cup TID; weekly weights; will continue to monitor  Keatts, Demetrius Charity, [place] NP [facility]  Contact [phone] Monday through Friday 8am- 5pm  After hours call [phone]

## 2014-10-04 ENCOUNTER — Telehealth: Payer: Self-pay | Admitting: Physician Assistant

## 2014-10-04 NOTE — Telephone Encounter (Signed)
Give patient's family her recent lab results, even thought it is already documented they have been made aware.  I would recommend she see Neurology for a dementia assessment.

## 2014-10-04 NOTE — Telephone Encounter (Signed)
Caller name: Bev Relation to UD:THYHOOIL Call back number:[phone]   Reason for call:  Wants to speak with Einar Pheasant or Ivin Booty regarding blood results.  Bev thought they were going to be doing some tests to check for dementia.  Wants clarification

## 2014-10-06 NOTE — Telephone Encounter (Signed)
Pt's daughter-in-law states that she received your message and she understands it and its entirety, would like for [name] [name] to proceed with the referral, states pt would like the same neurologist she had previously. Daughter in law states that she would like the name and number of the referral to call herself and make the appt once the referral is placed.

## 2014-10-06 NOTE — Telephone Encounter (Signed)
LMOM with contact name and number for return call RE: clarification needed about blood results and further provider instructions on Neurology referral/SLS   Notes Recorded by Rockwell Germany, CMA on 09/15/2014 at 2:24 PM Message Received: Today  Dustin T Shives Rockwell Germany, CMA  Daughter called, they want to leave everything as it is; no changes to medications. This is in reference to her low iron; she states they had spoke with you. Just and FYI   Notes Recorded by Rockwell Germany, CMA on 09/13/2014 at 5:13 PM Spoke with pt's daughter-in-law, informed, understood & will speak to patient's son about this/SLS Notes Recorded by Rockwell Germany, CMA on 09/06/2014 at 11:38 AM [facility] with contact name and number for return call RE: results and further provider instructions/SLS  Notes Recorded by Brunetta Jeans, PA-C on 09/01/2014 at 10:43 PM Vitamin D level looks good. See previous result note. Please inform patient's family. Notes Recorded by Brunetta Jeans, PA-C on 08/30/2014 at 10:30 PM Labs reveal a very mild iron deficiency. This could potentially be contributing to her restless leg symptoms. With Fahmida, we have to weigh in the benefits versus risks of treatment with iron supplementation. Treatment may improve RLS symptoms but can cause severe constipation. If we treat, I would recommend a short trial of a very low dose of iron supplement, with close monitoring of bowel movements for constipation. Otherwise so far labs look good. Vitamin D levels still pending. Please let me know what family decides regarding iron supplementation.

## 2014-10-07 NOTE — Telephone Encounter (Signed)
Patient has seen Dr. Posey Pronto this year.  Referral is not needed. Please call patient's daughter-in-law with Dr. Serita Grit office number for them to schedule appointment.

## 2014-10-08 NOTE — Telephone Encounter (Signed)
Pt daughter aware, lm on vm

## 2014-10-12 ENCOUNTER — Non-Acute Institutional Stay (SKILLED_NURSING_FACILITY): Payer: Medicare Other | Admitting: Adult Health

## 2014-10-12 ENCOUNTER — Encounter: Payer: Self-pay | Admitting: Adult Health

## 2014-10-12 DIAGNOSIS — F418 Other specified anxiety disorders: Secondary | ICD-10-CM

## 2014-10-12 DIAGNOSIS — D638 Anemia in other chronic diseases classified elsewhere: Secondary | ICD-10-CM

## 2014-10-12 DIAGNOSIS — G2581 Restless legs syndrome: Secondary | ICD-10-CM

## 2014-10-12 DIAGNOSIS — M3500 Sicca syndrome, unspecified: Secondary | ICD-10-CM

## 2014-10-12 DIAGNOSIS — I10 Essential (primary) hypertension: Secondary | ICD-10-CM

## 2014-10-12 DIAGNOSIS — R634 Abnormal weight loss: Secondary | ICD-10-CM

## 2014-10-12 DIAGNOSIS — K219 Gastro-esophageal reflux disease without esophagitis: Secondary | ICD-10-CM

## 2014-10-12 DIAGNOSIS — R609 Edema, unspecified: Secondary | ICD-10-CM

## 2014-10-12 NOTE — Progress Notes (Signed)
[facility]  Code Status: DNR  Allergies  Allergen Reactions  . Codeine Other (See Comments)    insomnia  . Doxycycline Diarrhea and Other (See Comments)    Hallucinations  . Fentanyl Hives and Rash  . Lisinopril Nausea And Vomiting, Rash and Hypertension    Chief Complaint: Medical Management of Chronic Health Issues  HPI:  [name] is a 80yr old female being seen today for a routine visit. Overall she is doing the same. No concerns voiced by patient or staff at this time. She denies chest pain, shortness of breath, or pain at this time. Her weight has stabilized this month and she has reported a two pound weight gain.    Review of Systems:  Constitutional: Negative for fever, chills, weight loss, malaise/fatigue and diaphoresis.   Respiratory: Negative for cough, sputum production, shortness of breath and wheezing.   Cardiovascular: Positive ankle edema; Negative for chest pain, palpitation. Gastrointestinal: Negative for heartburn, nausea, vomiting, abdominal pain, diarrhea and constipation.  Genitourinary: Negative for dysuria, urgency, frequency, hematuria and flank pain.  Musculoskeletal: Positive kyphosis and RLS.  Skin: Negative for itching and rash.  Neurological: Negative for weakness,dizziness, and headaches.  Psychiatric/Behavioral: The patient is not nervous/anxious.     Past Medical History  Diagnosis Date  . Arthritis   . History of chicken pox   . Depression     husband died 18-Aug-2011  . Glaucoma   . Thyroid disease   . Hypertension   . Hypokalemia   . Renal disorder   . Altered mental state   . Blind   . Macular degeneration   . Abrasion of arm, left 02/04/2013  . Compression fracture of T12 vertebra 03/21/2013  . Debility 03/21/2013  . Nasal congestion 03/21/2013  . Vasomotor rhinitis 03/21/2013  . Abnormal LFTs 03/21/2013  . Dental abscess 06/06/2013  . Thrush 08/02/2013  . Paronychia 10/11/2013  . Osteoarthritis of ankle and foot 01/12/2014    . RLS (restless legs syndrome) 01/12/2014  . Sacral decubitus ulcer, stage III 08/06/2012  . C. difficile diarrhea 09/12/2012   Past Surgical History  Procedure Laterality Date  . Revision total knee arthroplasty      right knee  . Back surgery    . Cataract extraction, bilateral    . Thyroidectomy    . Abdominal hysterectomy     Social History:   reports that she has never smoked. She has never used smokeless tobacco. She reports that she does not drink alcohol or use illicit drugs.  Family History  Problem Relation Age of Onset  . Other Father     Killed in war  . Other Mother     Deceased, 63  . Breast cancer Daughter   . Hypertension Son   . Hyperlipidemia Son     Medications: Patient's Medications  New Prescriptions   No medications on file  Previous Medications   ATENOLOL (TENORMIN) 50 MG TABLET    Take 0.5 tablets (25 mg total) by mouth daily.   AZELASTINE-FLUTICASONE 137-50 MCG/ACT SUSP    Place 2 sprays into both nostrils daily.   BENZOCAINE (ORAJEL) 10 % MUCOSAL GEL    Use as directed 1 application in the mouth or throat as needed for mouth pain.   CETIRIZINE (ZYRTEC) 10 MG TABLET    Take 1 tablet (10 mg total) by mouth daily.   FUROSEMIDE (LASIX) 20 MG TABLET    Take 20 mg by mouth daily as needed.    GABAPENTIN (NEURONTIN)  300 MG CAPSULE    Take 300 mg by mouth 2 (two) times daily.    LEVOTHYROXINE (SYNTHROID, LEVOTHROID) 88 MCG TABLET    Take 88 mcg by mouth daily before breakfast.   LOPERAMIDE (IMODIUM) 2 MG CAPSULE    Take 2 mg by mouth 4 (four) times daily as needed. For diarrhea.   MAGNESIUM OXIDE (MAG-OX) 400 MG TABLET    Take 400 mg by mouth daily.   NYSTATIN (MYCOSTATIN) 100000 UNIT/ML SUSPENSION    Take 5 mLs by mouth 2 (two) times daily as needed (Thrush).   NYSTATIN (MYCOSTATIN) POWDER    Apply topically 2 (two) times daily as needed.   NYSTATIN CREAM (MYCOSTATIN)    Apply 1 application topically 2 (two) times daily as needed for dry skin (Rash).    OMEPRAZOLE (PRILOSEC) 20 MG CAPSULE    Take 20 mg by mouth daily.   PILOCARPINE (SALAGEN) 5 MG TABLET    Take 5 mg by mouth 3 (three) times daily.   POLYVINYL ALCOHOL (LIQUIFILM TEARS) 1.4 % OPHTHALMIC SOLUTION    Place 1 drop into both eyes 2 (two) times daily. Wait 3-5 minutes between eye drops.   POTASSIUM CHLORIDE SA (K-DUR,KLOR-CON) 20 MEQ TABLET    Take 20 mEq by mouth once.   ROPINIROLE (REQUIP) 1 MG TABLET    Take 1 tablet (1 mg total) by mouth 4 (four) times daily. Take 0.5 mg at 6am; 0.5 mg 12pm; 1 mg 6pm 2 mg 10 pm.   SACCHAROMYCES BOULARDII (FLORASTOR) 250 MG CAPSULE    Take 250 mg by mouth 2 (two) times daily.   SERTRALINE (ZOLOFT) 100 MG TABLET    Take 100 mg by mouth daily.  Modified Medications   No medications on file  Discontinued Medications   No medications on file     Physical Exam:  Filed Vitals:   10/12/14 1005  BP: 129/63  Pulse: 81  Temp: 98 F (36.7 C)  Resp: 15  Weight: 125 lb 3.2 oz (56.79 kg)    General- elderly female in no acute distress Neck- no lymphadenopathy, no thyromegaly, no jugular vein distension, no carotid bruit Cardiovascular- normal s1,s2, no murmurs/ rubs/ gallops; 2+ pitting edema to bilateral ankles Respiratory- bilateral clear to auscultation, no wheeze, no rhonchi, no crackles, no use of accessory muscles Abdomen- bowel sounds present, soft, non tender, no organomegaly, non-distended Musculoskeletal- able to move all 4 extremities, kyphosis with neck contracture; decreased ROM Neurological- pt is alert and oriented Skin- warm and dry; healing/eschared sore to left lower shin; scattered bruising; bilateral discoloration to lower extremities    LABS REVIEWED:   01-12-14: wbc 5.1; hgb 12.2; hct 36.2; mcv 92.1; plt 217; glucose 76; bun 26; creat 0.99; k+3.9; na++141; liver normal albumin 3.4; tsh 0.266; vit b12: 1261; mag 2.2; phos 3.7; vit d 12   06-28-14: wbc 5.2; hgb 10.2; hct 31.3; mcv 96; plt 161; glucose 70; bun 18; creat 0.9;  k+3.7; na++140; liver normal albumin 2.6 07-02-14: wbc 5.9; hgb 11.6; hct 33.9; mcv 96.3; plt 169;glucose 70; bun 18; creat 1.0; k+3.9; na++141; liver normal albumin 3.0; chol 223; ldl 156; trig 97; ferritin 75; tsh 3.862 08-06-14: na 138; k 3.4; glucose 66; bun 15; creat 1.0; gfr 53.78; Mg 2.1 08-24-14: tsh 2.48 09-06-14: glucose 79; bun 18; creat 1.1; k+ 4.1; na++ 140       Assessment/Plan  1. Hypothyroidism: stable; she is status post thyroidectomy; continue synthroid 88 mcg daily; last TSH is 2.48   2. Vasomotor  rhinitis: is stable; will continue astelin nasal spray daily and zyrtec 10 mg daily and will monitor   3. Gerd: no issues; will continue prilosec 20 mg daily    4. Anemia: stable; last hgb 11.6; will continue to monitor her status.   5. Depression: mood fluctuates; pleasant this am; will continue her zoloft 100 mg daily and will monitor her status.   6. Hypertension: improved; will continue tenormin 25mg  daily; continue to monitor  7. RLS: this has improved for patient;  will continue Requip 4mg /total daily, 0.5 at 6am, 0.5mg  at 12pm; 1mg  at 6pm; 2mg  at 10pm. Continue her  gabapentin  300mg  po BID.    8. Sjogren's syndrome: stable; will continue her pilocarpine 5 mg po three times daily and her oral solution daily; will not make changes and will monitor her status.   9. Edema: increased ankle edema today; will continue lasix 20 mg daily and add BID for 2 days; K 4.1, continue daily oral potassium 20 meq daily   10. Weight loss: reported 2 pound weight gain this month; pts appetite improving; will continue to monitor  Keatts, Demetrius Charity, [place] NP [facility]  Contact [phone] Monday through Friday 8am- 5pm  After hours call [phone]

## 2014-11-17 ENCOUNTER — Encounter: Payer: Self-pay | Admitting: Internal Medicine

## 2014-11-17 ENCOUNTER — Non-Acute Institutional Stay (SKILLED_NURSING_FACILITY): Payer: Medicare Other | Admitting: Internal Medicine

## 2014-11-17 DIAGNOSIS — W19XXXA Unspecified fall, initial encounter: Secondary | ICD-10-CM | POA: Insufficient documentation

## 2014-11-17 DIAGNOSIS — F0390 Unspecified dementia without behavioral disturbance: Secondary | ICD-10-CM | POA: Insufficient documentation

## 2014-11-17 DIAGNOSIS — F0393 Unspecified dementia, unspecified severity, with mood disturbance: Secondary | ICD-10-CM

## 2014-11-17 DIAGNOSIS — I10 Essential (primary) hypertension: Secondary | ICD-10-CM

## 2014-11-17 DIAGNOSIS — D638 Anemia in other chronic diseases classified elsewhere: Secondary | ICD-10-CM | POA: Insufficient documentation

## 2014-11-17 DIAGNOSIS — E890 Postprocedural hypothyroidism: Secondary | ICD-10-CM

## 2014-11-17 DIAGNOSIS — F329 Major depressive disorder, single episode, unspecified: Secondary | ICD-10-CM

## 2014-11-17 DIAGNOSIS — F0280 Dementia in other diseases classified elsewhere without behavioral disturbance: Secondary | ICD-10-CM

## 2014-11-17 DIAGNOSIS — K219 Gastro-esophageal reflux disease without esophagitis: Secondary | ICD-10-CM

## 2014-11-17 NOTE — Progress Notes (Signed)
Patient ID: [name] [name], female   DOB: 05/04/1919, 80 y.o.   MRN: [id number]    Facility: [facility]   Chief Complaint  Patient presents with  . Medical Management of Chronic Issues   Allergies  Allergen Reactions  . Codeine Other (See Comments)    insomnia  . Doxycycline Diarrhea and Other (See Comments)    Hallucinations  . Fentanyl Hives and Rash  . Lisinopril Nausea And Vomiting, Rash and Hypertension   Code status: DNR  HPI 80 y/o female pt is seen for her routine visit. She is sitting on her wheelchair. She is pleasantly confused and her son is present during this visit. She had a fall last night as she was trying to get up from her chair unassisted. She had xrays which ruled out acute fractures. Her u/a is pending. She denies any pain/aches. No concern from patient today besides her finances and the way it is being handled.  She has pmh of htn, falls, ckd, thyroid disorder, gerd, ckd among others  Review of Systems Constitutional: Negative for fever, chills, malaise/fatigue and diaphoresis.  Respiratory: Negative for cough, shortness of breath and wheezing.   Cardiovascular: Negative for chest pain, palpitation. Gastrointestinal: Negative for heartburn, nausea, vomiting, abdominal pain, diarrhea and constipation.  Genitourinary: Negative for dysuria, urgency, frequency and flank pain.   Skin: Negative for itching and rash.  Neurological: Negative for weakness,dizziness, and headaches.  Psychiatric/Behavioral: The patient is not nervous/anxious.   Past Medical History  Diagnosis Date  . Arthritis   . History of chicken pox   . Depression     husband died 08-18-2011  . Glaucoma   . Thyroid disease   . Hypertension   . Hypokalemia   . Renal disorder   . Altered mental state   . Blind   . Macular degeneration   . Abrasion of arm, left 02/04/2013  . Compression fracture of T12 vertebra 03/21/2013  . Debility 03/21/2013  . Nasal congestion  03/21/2013  . Vasomotor rhinitis 03/21/2013  . Abnormal LFTs 03/21/2013  . Dental abscess 06/06/2013  . Thrush 08/02/2013  . Paronychia 10/11/2013  . Osteoarthritis of ankle and foot 01/12/2014  . RLS (restless legs syndrome) 01/12/2014  . Sacral decubitus ulcer, stage III 08/06/2012  . C. difficile diarrhea 09/12/2012   Past Surgical History  Procedure Laterality Date  . Revision total knee arthroplasty      right knee  . Back surgery    . Cataract extraction, bilateral    . Thyroidectomy    . Abdominal hysterectomy       Medication List       This list is accurate as of: 11/17/14  2:40 PM.  Always use your most recent med list.               atenolol 50 MG tablet  Commonly known as:  TENORMIN  Take 0.5 tablets (25 mg total) by mouth daily.     benzocaine 10 % mucosal gel  Commonly known as:  ORAJEL  Use as directed 1 application in the mouth or throat as needed for mouth pain.     cetirizine 10 MG tablet  Commonly known as:  ZYRTEC  Take 1 tablet (10 mg total) by mouth daily.     furosemide 20 MG tablet  Commonly known as:  LASIX  Take 20 mg by mouth daily.     gabapentin 300 MG capsule  Commonly known as:  NEURONTIN  Take 300 mg by  mouth 2 (two) times daily.     levothyroxine 88 MCG tablet  Commonly known as:  SYNTHROID, LEVOTHROID  Take 88 mcg by mouth daily before breakfast.     loperamide 2 MG capsule  Commonly known as:  IMODIUM  Take 2 mg by mouth 4 (four) times daily as needed. For diarrhea.     magnesium oxide 400 MG tablet  Commonly known as:  MAG-OX  Take 400 mg by mouth daily.     nystatin 100000 UNIT/ML suspension  Commonly known as:  MYCOSTATIN  Take 5 mLs by mouth 2 (two) times daily as needed (Thrush).     omeprazole 20 MG capsule  Commonly known as:  PRILOSEC  Take 20 mg by mouth daily.     polyvinyl alcohol 1.4 % ophthalmic solution  Commonly known as:  LIQUIFILM TEARS  Place 1 drop into both eyes 2 (two) times daily. Wait 3-5 minutes  between eye drops.     potassium chloride SA 20 MEQ tablet  Commonly known as:  K-DUR,KLOR-CON  Take 20 mEq by mouth once.     rOPINIRole 1 MG tablet  Commonly known as:  REQUIP  Take 1 tablet (1 mg total) by mouth 4 (four) times daily. Take 0.5 mg at 6am; 0.5 mg 12pm; 1 mg 6pm 2 mg 10 pm.     saccharomyces boulardii 250 MG capsule  Commonly known as:  FLORASTOR  Take 250 mg by mouth 2 (two) times daily.     sertraline 100 MG tablet  Commonly known as:  ZOLOFT  Take 100 mg by mouth daily.       Physical exam BP 138/64 mmHg  Pulse 60  Temp(Src) 97.5 F (36.4 C)  Resp 16  SpO2 95%  General- elderly female in no acute distress Neck- no lymphadenopathy, no thyromegaly, no jugular vein distension Cardiovascular- normal s1,s2, no murmurs, trace pitting edema to bilateral ankles Respiratory- bilateral clear to auscultation, no wheeze, no rhonchi, no crackles, no use of accessory muscles Abdomen- bowel sounds present, soft, non tender Musculoskeletal- able to move all 4 extremities, kyphosis present Neurological- patient is alert and oriented to person only Skin- warm and dry  Labs: 01-12-14: wbc 5.1; hgb 12.2; hct 36.2; mcv 92.1; plt 217; glucose 76; bun 26; creat 0.99; k+3.9; na++141; liver normal albumin 3.4; tsh 0.266; vit b12: 1261; mag 2.2; phos 3.7; vit d 12   06-28-14: wbc 5.2; hgb 10.2; hct 31.3; mcv 96; plt 161; glucose 70; bun 18; creat 0.9; k+3.7; na++140; liver normal albumin 2.6 07-02-14: wbc 5.9; hgb 11.6; hct 33.9; mcv 96.3; plt 169;glucose 70; bun 18; creat 1.0; k+3.9; na++141; liver normal albumin 3.0; chol 223; ldl 156; trig 97; ferritin 75; tsh 3.862 08-06-14: na 138; k 3.4; glucose 66; bun 15; creat 1.0; gfr 53.78; Mg 2.1 08-24-14: tsh 2.48 09-06-14: glucose 79; bun 18; creat 1.1; k+ 4.1; na++ 140   10-13-14 na 138, k 3.8, bun 12, cr 1.1, glu 71 10-28-14 wbc 5.1, hb 10.1, hct 31.1, plt 135    Assessment/Plan  Anemia Low hb, normal mcv. Check ferritin level,  I26, folic acid and iron store next lab. Her chronic disease- thyroid disorder, ckd, HTN could be contributing some.   Dementia Appears to be progressing with decline anticipated, has f/u with neurology. F/u recs. Continue nutritional supplement, monitor po intake, continue skin care and fall precuations  Falls Will rule out uti with lab work. Also check bmp to assess for lytes abnormality given her being on lasix.  With edema stable, d/c lasix for now. D/c kcl supplement  Hypothyroidism status post thyroidectomy on synthroid 88 mcg daily. Monitor clinically  gerd Symptoms are controlled. Change prilosec to 10 mg daily and reassess symptoms  Depression Stable, continue sertraline 100 mg daily for now  Hypertension Stable on  tenormin 25mg  daily, monitor bp. D/c lasix and monitor edema

## 2014-11-18 LAB — CBC AND DIFFERENTIAL
HEMATOCRIT: 34 % — AB (ref 36–46)
Hemoglobin: 11.4 g/dL — AB (ref 12.0–16.0)
Platelets: 138 10*3/uL — AB (ref 150–399)
WBC: 6 10^3/mL

## 2014-11-18 LAB — BASIC METABOLIC PANEL
BUN: 14 mg/dL (ref 4–21)
Creatinine: 1.1 mg/dL (ref 0.5–1.1)
GLUCOSE: 85 mg/dL
Potassium: 4.1 mmol/L (ref 3.4–5.3)
SODIUM: 141 mmol/L (ref 137–147)

## 2014-11-25 ENCOUNTER — Ambulatory Visit (INDEPENDENT_AMBULATORY_CARE_PROVIDER_SITE_OTHER): Payer: Medicare Other | Admitting: Neurology

## 2014-11-25 ENCOUNTER — Encounter: Payer: Self-pay | Admitting: Neurology

## 2014-11-25 VITALS — BP 108/78 | HR 64 | Wt 132.5 lb

## 2014-11-25 DIAGNOSIS — Z7189 Other specified counseling: Secondary | ICD-10-CM

## 2014-11-25 DIAGNOSIS — F0391 Unspecified dementia with behavioral disturbance: Secondary | ICD-10-CM

## 2014-11-25 DIAGNOSIS — F03918 Unspecified dementia, unspecified severity, with other behavioral disturbance: Secondary | ICD-10-CM | POA: Insufficient documentation

## 2014-11-25 NOTE — Progress Notes (Signed)
Follow-up Visit   Date: 11/25/2014    [name] [name] MRN: [id number] DOB: June 03, 1919   Interim History: [name] [name] is a 80 y.o. right-handed [place] female with history of severe macular degeneration (legally blind), deafness, RLS, osteoarthritis, and GERD with returning to the clinic for follow-up of memory loss.  The patient was accompanied to the clinic by son and daughter-in-law who also provides collateral information.    History of present illness: Since late 2014, she reports complaining of needles in her mouth and "gummy" sensation of the mouth. She feels as if there are things between her teeth, there is no associated numbness/tingling of abnormal sensation of the tongue, palate, or inner cheek. Symptoms are worse when she tries to clean her teeth, especially after eating, worse on there right side. Denies any alleviating symptoms. She had an episode in the office at which times she tried to pull something out of her teeth and even tells her children "look, there it is, do you see it?". There was nothing present. She has seen her PCP, dentist, and oral surgeon with non-diagnostic evaluation.   Memory has been deteriorating lately, especially with long-term memory. She has been in assisted living since 2013. She endorses vivid dreams. Denies any significant change in taste.  UPDATE 11/25/2014:  Family reports that they have noticed worsening memory problems that she is unable to recognize familiar faces and has delusional thoughts (winning the lottery).  She currently lives at [facility].  She had not complained of "gummy" sensation in a long time.  She had laboratory testing for infection and electrolytes which was normal.     Medications:  Current Outpatient Prescriptions on File Prior to Visit  Medication Sig Dispense Refill  . atenolol (TENORMIN) 50 MG tablet Take 0.5 tablets (25 mg total) by mouth daily. 90 tablet 11  . benzocaine (ORAJEL) 10 % mucosal  gel Use as directed 1 application in the mouth or throat as needed for mouth pain.    . cetirizine (ZYRTEC) 10 MG tablet Take 1 tablet (10 mg total) by mouth daily. 30 tablet 0  . furosemide (LASIX) 20 MG tablet Take 20 mg by mouth daily.     Marland Kitchen gabapentin (NEURONTIN) 300 MG capsule Take 300 mg by mouth 2 (two) times daily.     Marland Kitchen levothyroxine (SYNTHROID, LEVOTHROID) 88 MCG tablet Take 88 mcg by mouth daily before breakfast.    . loperamide (IMODIUM) 2 MG capsule Take 2 mg by mouth 4 (four) times daily as needed. For diarrhea.    . magnesium oxide (MAG-OX) 400 MG tablet Take 400 mg by mouth daily.    Marland Kitchen nystatin (MYCOSTATIN) 100000 UNIT/ML suspension Take 5 mLs by mouth 2 (two) times daily as needed (Thrush).    Marland Kitchen omeprazole (PRILOSEC) 20 MG capsule Take 20 mg by mouth daily.    . polyvinyl alcohol (LIQUIFILM TEARS) 1.4 % ophthalmic solution Place 1 drop into both eyes 2 (two) times daily. Wait 3-5 minutes between eye drops.    . potassium chloride SA (K-DUR,KLOR-CON) 20 MEQ tablet Take 20 mEq by mouth once.    Marland Kitchen rOPINIRole (REQUIP) 1 MG tablet Take 1 tablet (1 mg total) by mouth 4 (four) times daily. Take 0.5 mg at 6am; 0.5 mg 12pm; 1 mg 6pm 2 mg 10 pm. (Patient taking differently: Take 0.5 mg by mouth 2 (two) times daily. ) 120 tablet 11  . saccharomyces boulardii (FLORASTOR) 250 MG capsule Take 250 mg by mouth 2 (two) times daily.    Marland Kitchen  sertraline (ZOLOFT) 100 MG tablet Take 100 mg by mouth daily.     No current facility-administered medications on file prior to visit.    Allergies:  Allergies  Allergen Reactions  . Codeine Other (See Comments)    insomnia  . Doxycycline Diarrhea and Other (See Comments)    Hallucinations  . Fentanyl Hives and Rash  . Lisinopril Nausea And Vomiting, Rash and Hypertension    Review of Systems:  CONSTITUTIONAL: No fevers, chills, night sweats, or weight loss.  EYES: No visual changes or eye pain ENT: No hearing changes.  No history of nose bleeds.     RESPIRATORY: No cough, wheezing and shortness of breath.   CARDIOVASCULAR: Negative for chest pain, and palpitations.   GI: Negative for abdominal discomfort, blood in stools or black stools.  No recent change in bowel habits.   GU:  No history of incontinence.   MUSCLOSKELETAL: +history of joint pain or swelling.  No myalgias.   SKIN: Negative for lesions, rash, and itching.   ENDOCRINE: Negative for cold or heat intolerance, polydipsia or goiter.   PSYCH:  No depression or anxiety symptoms.   NEURO: As Above.   Vital Signs:  BP 108/78 mmHg  Pulse 64  Wt 132 lb 8 oz (60.102 kg)  SpO2 90%  Neurological Exam: Mental status: Awake, flat affect, anterocollis, stooped posture.  Identifies the year as 1922, month is December, holiday christmas, president is "[employer]graph[employer]".  Accurately identifies daughter-in-law and son with names.  She can correctly identify his age.  CRANIAL NERVES:   Legally blind, pupils round and reactive.  Face appears symmetric.  MOTOR:  Antigravity in all extremities.  COORDINATION/GAIT:  Wheel chair bound due to risk of falls.   IMPRESSION/PLAN: Ms. Withers is a 80 year-old female presenting with worsening memory problems and delusional thoughts.  Secondary causes such as infection and electrolyte abnormalities have been excluded.  She had spells where she is lucid and others during which time she is not.  Age-related dementia is most likely and I had a lengthy discussion with son and daughter-in-law regarding the course, management options, and prognosis.  Risks, benefits, and side effects of the medications were discussed and family has decided to keep her off anticholinesterase inhibitors since she already has lightheadedness and hallucinations and overall benefit is low.  If behavioral changes worsen, low dose seroquel can be used.  End-of-life issues such as Advanced Directives were also discussed and son says that she is DNR/DNI.  I offered the opportunity  to ask questions and answered them to the best of my ability.  Return to clinic as needed.    The duration of this appointment visit was 25 minutes of face-to-face time with the patient.  Greater than 50% of this time was spent in counseling, explanation of diagnosis, planning of further management, and coordination of care.   Thank you for allowing me to participate in patient's care.  If I can answer any additional questions, I would be pleased to do so.    Sincerely,    Katurah Karapetian K. Posey Pronto, DO

## 2014-12-17 ENCOUNTER — Non-Acute Institutional Stay (SKILLED_NURSING_FACILITY): Payer: Medicare Other | Admitting: Registered Nurse

## 2014-12-17 ENCOUNTER — Encounter: Payer: Self-pay | Admitting: Registered Nurse

## 2014-12-17 DIAGNOSIS — F0280 Dementia in other diseases classified elsewhere without behavioral disturbance: Secondary | ICD-10-CM

## 2014-12-17 DIAGNOSIS — G2581 Restless legs syndrome: Secondary | ICD-10-CM

## 2014-12-17 DIAGNOSIS — F329 Major depressive disorder, single episode, unspecified: Secondary | ICD-10-CM

## 2014-12-17 DIAGNOSIS — J300 Vasomotor rhinitis: Secondary | ICD-10-CM

## 2014-12-17 DIAGNOSIS — E890 Postprocedural hypothyroidism: Secondary | ICD-10-CM

## 2014-12-17 DIAGNOSIS — R600 Localized edema: Secondary | ICD-10-CM

## 2014-12-17 DIAGNOSIS — I10 Essential (primary) hypertension: Secondary | ICD-10-CM

## 2014-12-17 DIAGNOSIS — E538 Deficiency of other specified B group vitamins: Secondary | ICD-10-CM

## 2014-12-17 DIAGNOSIS — F0393 Unspecified dementia, unspecified severity, with mood disturbance: Secondary | ICD-10-CM

## 2014-12-17 DIAGNOSIS — K219 Gastro-esophageal reflux disease without esophagitis: Secondary | ICD-10-CM

## 2014-12-17 DIAGNOSIS — D638 Anemia in other chronic diseases classified elsewhere: Secondary | ICD-10-CM

## 2014-12-17 DIAGNOSIS — F0390 Unspecified dementia without behavioral disturbance: Secondary | ICD-10-CM

## 2014-12-17 NOTE — Progress Notes (Signed)
Patient ID: Gerlean Ren, female   DOB: 1919-03-24, 81 y.o.   MRN: [id number]   Place of Service: [facility]  Allergies  Allergen Reactions  . Codeine Other (See Comments)    insomnia  . Doxycycline Diarrhea and Other (See Comments)    Hallucinations  . Fentanyl Hives and Rash  . Lisinopril Nausea And Vomiting, Rash and Hypertension    Code Status: DNR  Goals of Care: Comfort and Quality of Life/LTC  Chief Complaint  Patient presents with  . Medical Management of Chronic Issues    dementia, GERD, depression, HTN, hypothyroidism, anemia, hx falll    HPI 81 y.o. female with PMH of dementia, macular degeneration (legally blind), OA, depression, OA, GERD, HTN, anemia among others is being seen for a routine visit for management of her chronic issues. Weight stable. Fell on 11/16/14 w/o any injury or fracture. No skin concerns reported. No change in behaviors or functional status reported. No concerns from staff. Seen in wheelchair today. No complaints verbalized. Dementia is progressing-not on any medications for dementia. GERD is stable on ppi-no issues since dose reduction last month. Anemia is stable-recent hgb/hct 11.4/34.2. HTN is stable on atenolol. Mood stable with zoloft. Hypothyroidism stable with current dose of levothyroxine.   Review of Systems Constitutional: Negative for fever and chills. HENT: Negative for ear pain and sore throat Eyes: Negative for eye pain and eye discharge Cardiovascular: Negative for chest pain Respiratory: Negative cough and shortness of breath.  Gastrointestinal: Negative for nausea and vomiting. Negative for abdominal pain Genitourinary: Negative for dysuria Musculoskeletal: Negative for back pain, joint pain, and joint swelling  Neurological: Negative for dizziness and headache Skin: Negative for rash and wound.   Psychiatric: Negative for depression  Past Medical History  Diagnosis Date  . Arthritis   . History of chicken  pox   . Depression     husband died 09-17-2011  . Glaucoma   . Thyroid disease   . Hypertension   . Hypokalemia   . Renal disorder   . Altered mental state   . Blind   . Macular degeneration   . Abrasion of arm, left 02/04/2013  . Compression fracture of T12 vertebra 03/21/2013  . Debility 03/21/2013  . Nasal congestion 03/21/2013  . Vasomotor rhinitis 03/21/2013  . Abnormal LFTs 03/21/2013  . Dental abscess 06/06/2013  . Thrush 08/02/2013  . Paronychia 10/11/2013  . Osteoarthritis of ankle and foot 01/12/2014  . RLS (restless legs syndrome) 01/12/2014  . Sacral decubitus ulcer, stage III 08/06/2012  . C. difficile diarrhea 09/12/2012    Past Surgical History  Procedure Laterality Date  . Revision total knee arthroplasty      right knee  . Back surgery    . Cataract extraction, bilateral    . Thyroidectomy    . Abdominal hysterectomy      History   Social History  . Marital Status: Widowed    Spouse Name: N/A    Number of Children: N/A  . Years of Education: N/A   Occupational History  . Not on file.   Social History Main Topics  . Smoking status: Never Smoker   . Smokeless tobacco: Never Used  . Alcohol Use: No  . Drug Use: No  . Sexual Activity: No   Other Topics Concern  . Not on file   Social History Narrative   She is retired from working in a [employer].   Originally from [place] and immigrated from [place] in 1950  She was living in [place] until 2012 with her husband, until he passed away and then moved here.    Family History  Problem Relation Age of Onset  . Other Father     Killed in war  . Other Mother     Deceased, 87  . Breast cancer Daughter   . Hypertension Son   . Hyperlipidemia Son       Medication List       This list is accurate as of: 12/17/14  8:26 PM.  Always use your most recent med list.               atenolol 50 MG tablet  Commonly known as:  TENORMIN  Take 0.5 tablets (25 mg total) by mouth daily.     benzocaine 10 % mucosal gel    Commonly known as:  ORAJEL  Use as directed 1 application in the mouth or throat as needed for mouth pain.     cetirizine 10 MG tablet  Commonly known as:  ZYRTEC  Take 1 tablet (10 mg total) by mouth daily.     gabapentin 300 MG capsule  Commonly known as:  NEURONTIN  Take 300 mg by mouth 2 (two) times daily.     levothyroxine 88 MCG tablet  Commonly known as:  SYNTHROID, LEVOTHROID  Take 88 mcg by mouth daily before breakfast.     loperamide 2 MG capsule  Commonly known as:  IMODIUM  Take 2 mg by mouth 4 (four) times daily as needed. For diarrhea.     magnesium oxide 400 MG tablet  Commonly known as:  MAG-OX  Take 400 mg by mouth daily.     nystatin 100000 UNIT/ML suspension  Commonly known as:  MYCOSTATIN  Take 5 mLs by mouth 2 (two) times daily as needed (Thrush).     omeprazole 10 MG capsule  Commonly known as:  PRILOSEC  Take 10 mg by mouth daily.     polyvinyl alcohol 1.4 % ophthalmic solution  Commonly known as:  LIQUIFILM TEARS  Place 1 drop into both eyes 2 (two) times daily. Wait 3-5 minutes between eye drops.     potassium chloride SA 20 MEQ tablet  Commonly known as:  K-DUR,KLOR-CON  Take 20 mEq by mouth once.     rOPINIRole 1 MG tablet  Commonly known as:  REQUIP  Take 1 tablet (1 mg total) by mouth 4 (four) times daily. Take 0.5 mg at 6am; 0.5 mg 12pm; 1 mg 6pm 2 mg 10 pm.     saccharomyces boulardii 250 MG capsule  Commonly known as:  FLORASTOR  Take 250 mg by mouth 2 (two) times daily.     sertraline 100 MG tablet  Commonly known as:  ZOLOFT  Take 100 mg by mouth daily.        Physical Exam  BP 130/65 mmHg  Pulse 68  Temp(Src) 97 F (36.1 C)  Resp 20  Ht 5\' 2"  (1.575 m)  Wt 125 lb 12.8 oz (57.063 kg)  BMI 23.00 kg/m2  SpO2 96%  Constitutional: WDWN elderly female in no acute distress. Stooped posture HEENT: Normocephalic and atraumatic. PERRL. No nasal discharge or sinus tenderness. Oral mucosa moist. Posterior pharynx clear of any  exudate or lesions.  Neck: Supple and nontender. No lymphadenopathy, masses, or thyromegaly. No JVD or carotid bruits. Cardiac: Normal S1, S2. RRR without appreciable murmurs, rubs, or gallops. Distal pulses intact. 2+ pitting edema of BLE R>L Lungs: No respiratory distress. Breath sounds  clear bilaterally without rales, rhonchi, or wheezes. Abdomen: Audible bowel sounds in all quadrants. Soft, nontender, nondistended. Musculoskeletal: Anterocollis. Marked kyphosis. No joint erythema or tenderness. Skin: Cool and dry. No rash noted. No erythema.  Neurological: Alert  Psychiatric: flat affect.  Labs Reviewed  CBC Latest Ref Rng 11/18/2014 08/27/2014 07/02/2014  WBC - 6.0 5.7 5.9  Hemoglobin 12.0 - 16.0 g/dL 11.4(A) 12.6 11.6(A)  Hematocrit 36 - 46 % 34(A) 35.8(L) 34(A)  Platelets 150 - 399 K/L 138(A) 223 169    CMP Latest Ref Rng 11/18/2014 08/27/2014 07/02/2014  Glucose 70 - 99 mg/dL - 91 -  BUN 4 - 21 mg/dL 14 14 18   Creatinine 0.5 - 1.1 mg/dL 1.1 1.07 1.0  Sodium 137 - 147 mmol/L 141 138 141  Potassium 3.4 - 5.3 mmol/L 4.1 4.5 3.9  Chloride 96 - 112 mEq/L - 105 -  CO2 19 - 32 mEq/L - 22 -  Calcium 8.4 - 10.5 mg/dL - 9.0 -  Total Protein 6.0 - 8.3 g/dL - - -  Total Bilirubin 0.2 - 1.2 mg/dL - - -  Alkaline Phos 25 - 125 U/L - - 149(A)  AST 13 - 35 U/L - - 10(A)  ALT 7 - 35 U/L - - 7    Lab Results  Component Value Date   TSH 2.239 08/27/2014    Lipid Panel     Component Value Date/Time   TRIG 97 07/02/2014   LDLCALC 156 07/02/2014    Assessment & Plan 1. Anemia, chronic disease Stable. Hgb/hct 11.4/34.2. Continue to monitor h&h.   2. Gastroesophageal reflux disease, esophagitis presence not specified Stable. Continue omeprazole 10mg  daily. If remains asymptomatic, will discontinue ppi.   3. Dementia, without behavioral disturbance Progressive with decline anticipated. Not on medications for dementia. Continue to monitor for change in behaviors. Continue fall risk  and pressure ulcer prophylaxis.   4. Depression due to dementia Stable. Continue zoloft 100mg  daily and monitor for change in mood  5. Postoperative hypothyroidism Stable. Continue levothyroxine 32mcg daily and monitor.   6. Essential hypertension, benign Stable. Continue atenolol 25mg  daily and monitor   7. RLS (restless legs syndrome) Stable. Continue requip 0.5mg  twice daily  8. Vasomotor rhinitis Stable. Continue zyrtec 10mg  daily and monitor  9. Folate deficiency Start folate 1mg  daily and monitor.   10. Bilateral leg edema Persists. Does not have TED hose on today. Continue TED hose to BLE daily while awake. If worsens, will considering restarting lasix. Continue to monitor for now.    Family/Staff Communication Plan of care discussed with nursing staff. Nursing staff verbalized understanding and agree with plan of care. No additional questions or concerns reported.    Arthur Holms, MSN, AGNP-C [facility] [address] [place], [place] [address] [phone] [8am-5pm] After hours: [phone]

## 2015-01-18 ENCOUNTER — Non-Acute Institutional Stay (SKILLED_NURSING_FACILITY): Payer: Medicare Other | Admitting: Registered Nurse

## 2015-01-18 ENCOUNTER — Encounter: Payer: Self-pay | Admitting: Registered Nurse

## 2015-01-18 DIAGNOSIS — R600 Localized edema: Secondary | ICD-10-CM

## 2015-01-18 DIAGNOSIS — E538 Deficiency of other specified B group vitamins: Secondary | ICD-10-CM

## 2015-01-18 DIAGNOSIS — N183 Chronic kidney disease, stage 3 unspecified: Secondary | ICD-10-CM

## 2015-01-18 DIAGNOSIS — F329 Major depressive disorder, single episode, unspecified: Secondary | ICD-10-CM

## 2015-01-18 DIAGNOSIS — M3500 Sicca syndrome, unspecified: Secondary | ICD-10-CM

## 2015-01-18 DIAGNOSIS — G2581 Restless legs syndrome: Secondary | ICD-10-CM

## 2015-01-18 DIAGNOSIS — F0393 Unspecified dementia, unspecified severity, with mood disturbance: Secondary | ICD-10-CM

## 2015-01-18 DIAGNOSIS — F0390 Unspecified dementia without behavioral disturbance: Secondary | ICD-10-CM

## 2015-01-18 DIAGNOSIS — D638 Anemia in other chronic diseases classified elsewhere: Secondary | ICD-10-CM

## 2015-01-18 DIAGNOSIS — R634 Abnormal weight loss: Secondary | ICD-10-CM

## 2015-01-18 DIAGNOSIS — I10 Essential (primary) hypertension: Secondary | ICD-10-CM

## 2015-01-18 DIAGNOSIS — E890 Postprocedural hypothyroidism: Secondary | ICD-10-CM

## 2015-01-18 DIAGNOSIS — F0280 Dementia in other diseases classified elsewhere without behavioral disturbance: Secondary | ICD-10-CM

## 2015-01-18 DIAGNOSIS — K219 Gastro-esophageal reflux disease without esophagitis: Secondary | ICD-10-CM

## 2015-01-18 DIAGNOSIS — J300 Vasomotor rhinitis: Secondary | ICD-10-CM

## 2015-01-18 NOTE — Progress Notes (Signed)
Patient ID: [name] [name], female   DOB: September 04, 1919, 81 y.o.   MRN: [id number]   Place of Service: [facility]  Allergies  Allergen Reactions  . Codeine Other (See Comments)    insomnia  . Doxycycline Diarrhea and Other (See Comments)    Hallucinations  . Fentanyl Hives and Rash  . Lisinopril Nausea And Vomiting, Rash and Hypertension    Code Status: DNR  Goals of Care: Comfort and Quality of Life/LTC  Chief Complaint  Patient presents with  . Medical Management of Chronic Issues    folic acid deficiency anemia, GERD, dementia, depression, hypothyroidism, RLS, venous insufficiency    HPI 81 y.o. female with PMH of dementia, macular degeneration (legally blind), OA, depression, GERD, HTN, anemia among others is being seen for a routine visit for management of her chronic issues. Has 5 lbs weight loss since last visit. No skin concerns reported. No change in behaviors or functional status reported. No concerns from staff. Seen in wheelchair today. Unable to participate in HPI and ROS but denies any pain or discomfort. Dementia is progressing-not on any medications for dementia. GERD is stable on ppi. Anemia is stable-recent hgb/hct 11.4/34.2. HTN is stable on atenolol. Mood stable with zoloft. Hypothyroidism stable with current dose of levothyroxine. RLS stable on requip.   Review of Systems Unable to obtain due to dementia  Past Medical History  Diagnosis Date  . Arthritis   . History of chicken pox   . Depression     husband died 09/24/11  . Glaucoma   . Thyroid disease   . Hypertension   . Hypokalemia   . Renal disorder   . Altered mental state   . Blind   . Macular degeneration   . Abrasion of arm, left 02/04/2013  . Compression fracture of T12 vertebra 03/21/2013  . Debility 03/21/2013  . Nasal congestion 03/21/2013  . Vasomotor rhinitis 03/21/2013  . Abnormal LFTs 03/21/2013  . Dental abscess 06/06/2013  . Thrush 08/02/2013  . Paronychia 10/11/2013  .  Osteoarthritis of ankle and foot 01/12/2014  . RLS (restless legs syndrome) 01/12/2014  . Sacral decubitus ulcer, stage III 08/06/2012  . C. difficile diarrhea 09/12/2012    Past Surgical History  Procedure Laterality Date  . Revision total knee arthroplasty      right knee  . Back surgery    . Cataract extraction, bilateral    . Thyroidectomy    . Abdominal hysterectomy      History   Social History  . Marital Status: Widowed    Spouse Name: N/A  . Number of Children: N/A  . Years of Education: N/A   Occupational History  . Not on file.   Social History Main Topics  . Smoking status: Never Smoker   . Smokeless tobacco: Never Used  . Alcohol Use: No  . Drug Use: No  . Sexual Activity: No   Other Topics Concern  . Not on file   Social History Narrative   She is retired from working in a [employer].   Originally from [place] and immigrated from [place] in 1950   She was living in [place] until 2012 with her husband, until he passed away and then moved here.    Family History  Problem Relation Age of Onset  . Other Father     Killed in war  . Other Mother     Deceased, 69  . Breast cancer Daughter   . Hypertension Son   . Hyperlipidemia Son  Medication List       This list is accurate as of: 01/18/15 11:59 PM.  Always use your most recent med list.               atenolol 50 MG tablet  Commonly known as:  TENORMIN  Take 0.5 tablets (25 mg total) by mouth daily.     azelastine 0.1 % nasal spray  Commonly known as:  ASTELIN  Place 2 sprays into both nostrils daily. Use in each nostril as directed     cetirizine 10 MG tablet  Commonly known as:  ZYRTEC  Take 1 tablet (10 mg total) by mouth daily.     folic acid 1 MG tablet  Commonly known as:  FOLVITE  Take 1 mg by mouth daily.     gabapentin 300 MG capsule  Commonly known as:  NEURONTIN  Take 300 mg by mouth 2 (two) times daily.     levothyroxine 88 MCG tablet  Commonly known as:  SYNTHROID,  LEVOTHROID  Take 88 mcg by mouth daily before breakfast.     magnesium oxide 400 MG tablet  Commonly known as:  MAG-OX  Take 400 mg by mouth daily.     pilocarpine 5 MG tablet  Commonly known as:  SALAGEN  Take 5 mg by mouth 3 (three) times daily.     polyvinyl alcohol 1.4 % ophthalmic solution  Commonly known as:  LIQUIFILM TEARS  Place 1 drop into both eyes 2 (two) times daily. Wait 3-5 minutes between eye drops.     rOPINIRole 1 MG tablet  Commonly known as:  REQUIP  Take 0.5-2 mg by mouth 3 (three) times daily. 0.5mg  twice daily at 6am and 12noon with 2mg  daily at bedtime     saccharomyces boulardii 250 MG capsule  Commonly known as:  FLORASTOR  Take 250 mg by mouth 2 (two) times daily.     sertraline 100 MG tablet  Commonly known as:  ZOLOFT  Take 100 mg by mouth daily.        Physical Exam  BP 129/56 mmHg  Pulse 66  Temp(Src) 97 F (36.1 C)  Resp 20  Ht 5\' 2"  (1.575 m)  Wt 120 lb (54.432 kg)  BMI 21.94 kg/m2  Constitutional: WDWN elderly female in no acute distress. Stooped posture HEENT: Normocephalic and atraumatic. PERRL. No nasal discharge or sinus tenderness. Oral mucosa moist. Posterior pharynx clear of any exudate or lesions.  Neck: Supple and nontender. No lymphadenopathy, masses, or thyromegaly. No JVD or carotid bruits. Cardiac: Normal S1, S2. RRR without appreciable murmurs, rubs, or gallops. Distal pulses intact. 2+ pitting edema of BLE with TED hose in place Lungs: No respiratory distress. Breath sounds clear bilaterally without rales, rhonchi, or wheezes. Abdomen: Audible bowel sounds in all quadrants. Soft, nontender, nondistended. Musculoskeletal: Anterocollis. Marked kyphosis. No joint erythema or tenderness. Skin: Cool and dry. No rash noted.   Neurological: Alert  Psychiatric: flat affect.  Labs Reviewed  CBC Latest Ref Rng 11/18/2014 08/27/2014 07/02/2014  WBC - 6.0 5.7 5.9  Hemoglobin 12.0 - 16.0 g/dL 11.4(A) 12.6 11.6(A)  Hematocrit  36 - 46 % 34(A) 35.8(L) 34(A)  Platelets 150 - 399 K/L 138(A) 223 169    CMP Latest Ref Rng 11/18/2014 08/27/2014 07/02/2014  Glucose 70 - 99 mg/dL - 91 -  BUN 4 - 21 mg/dL 14 14 18   Creatinine 0.5 - 1.1 mg/dL 1.1 1.07 1.0  Sodium 137 - 147 mmol/L 141 138 141  Potassium 3.4 -  5.3 mmol/L 4.1 4.5 3.9  Chloride 96 - 112 mEq/L - 105 -  CO2 19 - 32 mEq/L - 22 -  Calcium 8.4 - 10.5 mg/dL - 9.0 -  Total Protein 6.0 - 8.3 g/dL - - -  Total Bilirubin 0.2 - 1.2 mg/dL - - -  Alkaline Phos 25 - 125 U/L - - 149(A)  AST 13 - 35 U/L - - 10(A)  ALT 7 - 35 U/L - - 7    Lab Results  Component Value Date   TSH 2.239 08/27/2014    Lipid Panel     Component Value Date/Time   TRIG 97 07/02/2014   LDLCALC 156 07/02/2014    Assessment & Plan 1. Anemia, chronic disease Stable. Hgb/hct 11.4/34.2. Continue to monitor h&h.   2. Gastroesophageal reflux disease, esophagitis presence not specified No issues. Will discontinue omeprazole 20mg  daily. Monitor for symptoms.    3. Dementia, without behavioral disturbance Stable. Further decline anticipated. Not on medications for dementia. Continue to monitor for change in behaviors. Continue assist with ADL care and restorative therapy. Continue  fall risk and pressure ulcer precautions  4. Depression due to dementia Mood stable. Continue zoloft 100mg  daily and monitor for change in mood  5. Postoperative hypothyroidism Stable. Last TSH normal. Continue levothyroxine 60mcg daily and monitor.   6. Essential hypertension, benign Stable. Continue atenolol 25mg  daily. Continue to monitor BP and her status   7. RLS (restless legs syndrome) Stable. Continue requip 0.5mg  twice daily at 6am and 12 noon with 2mg  daily at bedtime. Continue to monitor  8. Vasomotor rhinitis Stable. Continue zyrtec 10mg  daily with asteline 0.1% nasal spray daily.   9. Folate deficiency Stable. Continue folate 1mg  daily and monitor.   10. Bilateral leg edema Stable.  Continue TED hose to BLE daily while awake. Encourage elevating BLE while in bed. Continue to monitor   11. Sjogren's syndrome Stable. Continue pilocarpine 5mg  three times daily and liquitears to each eye twice daily  12. CKD stage 3 Stable. Avoid nephrotoxic agents, especially NSAIDs. Continue to monitor renal functions.  13. Weight loss 5lbs weigh loss since last routine visit. Further weight loss is anticipated with dementia. Continue regular diet with fortified foods and magic cup 3 times daily with meals. Continue to monitor weight and her status  Family/Staff Communication Plan of care discussed with nursing staff. Nursing staff verbalized understanding and agree with plan of care. No additional questions or concerns reported.    Arthur Holms, MSN, AGNP-C [facility] [address] [place], [place] [address] [phone] [8am-5pm] After hours: [phone]

## 2015-02-16 ENCOUNTER — Non-Acute Institutional Stay (SKILLED_NURSING_FACILITY): Payer: Medicare Other | Admitting: Registered Nurse

## 2015-02-16 DIAGNOSIS — E890 Postprocedural hypothyroidism: Secondary | ICD-10-CM

## 2015-02-16 DIAGNOSIS — I872 Venous insufficiency (chronic) (peripheral): Secondary | ICD-10-CM

## 2015-02-16 DIAGNOSIS — F0390 Unspecified dementia without behavioral disturbance: Secondary | ICD-10-CM

## 2015-02-16 DIAGNOSIS — M3500 Sicca syndrome, unspecified: Secondary | ICD-10-CM

## 2015-02-16 DIAGNOSIS — F329 Major depressive disorder, single episode, unspecified: Secondary | ICD-10-CM | POA: Diagnosis not present

## 2015-02-16 DIAGNOSIS — F0280 Dementia in other diseases classified elsewhere without behavioral disturbance: Secondary | ICD-10-CM | POA: Diagnosis not present

## 2015-02-16 DIAGNOSIS — G2581 Restless legs syndrome: Secondary | ICD-10-CM

## 2015-02-16 DIAGNOSIS — J300 Vasomotor rhinitis: Secondary | ICD-10-CM

## 2015-02-16 DIAGNOSIS — N183 Chronic kidney disease, stage 3 unspecified: Secondary | ICD-10-CM

## 2015-02-16 DIAGNOSIS — D638 Anemia in other chronic diseases classified elsewhere: Secondary | ICD-10-CM | POA: Diagnosis not present

## 2015-02-16 DIAGNOSIS — F0393 Unspecified dementia, unspecified severity, with mood disturbance: Secondary | ICD-10-CM

## 2015-02-16 DIAGNOSIS — E538 Deficiency of other specified B group vitamins: Secondary | ICD-10-CM | POA: Diagnosis not present

## 2015-02-16 DIAGNOSIS — I10 Essential (primary) hypertension: Secondary | ICD-10-CM

## 2015-02-17 ENCOUNTER — Encounter: Payer: Self-pay | Admitting: Registered Nurse

## 2015-02-17 NOTE — Progress Notes (Signed)
Patient ID: [name] [name], female   DOB: 12-14-1918, 81 y.o.   MRN: [id number]   Place of Service: [facility]  Allergies  Allergen Reactions  . Codeine Other (See Comments)    insomnia  . Doxycycline Diarrhea and Other (See Comments)    Hallucinations  . Fentanyl Hives and Rash  . Lisinopril Nausea And Vomiting, Rash and Hypertension    Code Status: DNR  Goals of Care: Comfort and Quality of Life/LTC  Chief Complaint  Patient presents with  . Medical Management of Chronic Issues    hypothyroidism, HTN, RLS, dementia, depression, folate def., ckd, venous insufficiency, vasomotor rhinits     HPI 81 y.o. female with PMH of dementia, macular degeneration (legally blind), OA, depression, GERD, HTN, anemia among others is being seen for a routine visit for management of her chronic issues. Weight stable over the past 30 days (2lbs weight gain). No recent fall skin concerns reported. No change in behaviors or functional status reported. No concerns from staff. Seen in wheelchair today. Unable to participate in HPI and ROS but denies any pain or discomfort. Dementia is advanced. BP well controlled with BP range 108-130s/50-80s. Mood stable with zoloft. RLS stable on requip. Hypothyroidism stable with current dose of levothyroxine.   Review of Systems Unable to obtain due to dementia  Past Medical History  Diagnosis Date  . Arthritis   . History of chicken pox   . Depression     husband died 2011-09-24  . Glaucoma   . Thyroid disease   . Hypertension   . Hypokalemia   . Renal disorder   . Altered mental state   . Blind   . Macular degeneration   . Abrasion of arm, left 02/04/2013  . Compression fracture of T12 vertebra 03/21/2013  . Debility 03/21/2013  . Nasal congestion 03/21/2013  . Vasomotor rhinitis 03/21/2013  . Abnormal LFTs 03/21/2013  . Dental abscess 06/06/2013  . Thrush 08/02/2013  . Paronychia 10/11/2013  . Osteoarthritis of ankle and foot 01/12/2014  . RLS  (restless legs syndrome) 01/12/2014  . Sacral decubitus ulcer, stage III 08/06/2012  . C. difficile diarrhea 09/12/2012    Past Surgical History  Procedure Laterality Date  . Revision total knee arthroplasty      right knee  . Back surgery    . Cataract extraction, bilateral    . Thyroidectomy    . Abdominal hysterectomy      History   Social History  . Marital Status: Widowed    Spouse Name: N/A  . Number of Children: N/A  . Years of Education: N/A   Occupational History  . Not on file.   Social History Main Topics  . Smoking status: Never Smoker   . Smokeless tobacco: Never Used  . Alcohol Use: No  . Drug Use: No  . Sexual Activity: No   Other Topics Concern  . Not on file   Social History Narrative   She is retired from working in a [employer].   Originally from [place] and immigrated from [place] in 1950   She was living in [place] until 2012 with her husband, until he passed away and then moved here.    Family History  Problem Relation Age of Onset  . Other Father     Killed in war  . Other Mother     Deceased, 32  . Breast cancer Daughter   . Hypertension Son   . Hyperlipidemia Son       Medication  List       This list is accurate as of: 02/16/15 11:59 PM.  Always use your most recent med list.               atenolol 50 MG tablet  Commonly known as:  TENORMIN  Take 0.5 tablets (25 mg total) by mouth daily.     azelastine 0.1 % nasal spray  Commonly known as:  ASTELIN  Place 2 sprays into both nostrils daily. Use in each nostril as directed     cetirizine 10 MG tablet  Commonly known as:  ZYRTEC  Take 1 tablet (10 mg total) by mouth daily.     folic acid 1 MG tablet  Commonly known as:  FOLVITE  Take 1 mg by mouth daily.     gabapentin 300 MG capsule  Commonly known as:  NEURONTIN  Take 300 mg by mouth 2 (two) times daily.     levothyroxine 88 MCG tablet  Commonly known as:  SYNTHROID, LEVOTHROID  Take 88 mcg by mouth daily before breakfast.       magnesium oxide 400 MG tablet  Commonly known as:  MAG-OX  Take 400 mg by mouth daily.     pilocarpine 5 MG tablet  Commonly known as:  SALAGEN  Take 5 mg by mouth 3 (three) times daily.     polyvinyl alcohol 1.4 % ophthalmic solution  Commonly known as:  LIQUIFILM TEARS  Place 1 drop into both eyes 2 (two) times daily. Wait 3-5 minutes between eye drops.     rOPINIRole 1 MG tablet  Commonly known as:  REQUIP  Take 0.5-2 mg by mouth 3 (three) times daily. 0.5mg  twice daily at 6am and 12noon with 2mg  daily at bedtime     saccharomyces boulardii 250 MG capsule  Commonly known as:  FLORASTOR  Take 250 mg by mouth 2 (two) times daily.     sertraline 100 MG tablet  Commonly known as:  ZOLOFT  Take 100 mg by mouth daily.        Physical Exam  BP 122/66 mmHg  Pulse 77  Temp(Src) 97.8 F (36.6 C)  Resp 20  Ht 5\' 2"  (1.575 m)  Wt 122 lb 6.4 oz (55.52 kg)  BMI 22.38 kg/m2  SpO2 95%  Constitutional: WDWN elderly female in no acute distress. Stooped posture HEENT: Normocephalic and atraumatic. PERRL. Clear nasal discharge noted. No sinus tenderness. Oral mucosa moist.  Neck: No lymphadenopathy, masses, or thyromegaly. No JVD or carotid bruits. Cardiac: Normal S1, S2. RRR without appreciable murmurs, rubs, or gallops. Distal pulses intact. 2+ pitting edema of BLE with TED hose in place Lungs: No respiratory distress. Breath sounds clear bilaterally without rales, rhonchi, or wheezes. Abdomen: Audible bowel sounds in all quadrants. Soft, nontender, nondistended. Musculoskeletal: Anterocollis. Marked kyphosis. No joint erythema or tenderness. Skin: Cool and dry. Hyperpigmentation of BLE noted.  Neurological: Alert  Psychiatric: flat affect.  Labs Reviewed  CBC Latest Ref Rng 11/18/2014 08/27/2014 07/02/2014  WBC - 6.0 5.7 5.9  Hemoglobin 12.0 - 16.0 g/dL 11.4(A) 12.6 11.6(A)  Hematocrit 36 - 46 % 34(A) 35.8(L) 34(A)  Platelets 150 - 399 K/L 138(A) 223 169    CMP  Latest Ref Rng 11/18/2014 08/27/2014 07/02/2014  Glucose 70 - 99 mg/dL - 91 -  BUN 4 - 21 mg/dL 14 14 18   Creatinine 0.5 - 1.1 mg/dL 1.1 1.07 1.0  Sodium 137 - 147 mmol/L 141 138 141  Potassium 3.4 - 5.3 mmol/L 4.1 4.5 3.9  Chloride 96 - 112 mEq/L - 105 -  CO2 19 - 32 mEq/L - 22 -  Calcium 8.4 - 10.5 mg/dL - 9.0 -  Total Protein 6.0 - 8.3 g/dL - - -  Total Bilirubin 0.2 - 1.2 mg/dL - - -  Alkaline Phos 25 - 125 U/L - - 149(A)  AST 13 - 35 U/L - - 10(A)  ALT 7 - 35 U/L - - 7    Lab Results  Component Value Date   TSH 2.239 08/27/2014    Lipid Panel     Component Value Date/Time   TRIG 97 07/02/2014   LDLCALC 156 07/02/2014    Assessment & Plan 1. Anemia, chronic disease Stable. Last h&h 11.4/34.2. Continue to monitor.   2. Dementia, without behavioral disturbance Advanced. Further decline anticipated. Continue to monitor for change in mood/behavior. Continue assist with ADLs. Continue  fall risk and pressure ulcer prophylaxis.  3. Depression due to dementia Mood stable. Continue zoloft 100mg  daily and monitor for change in mood  4. Postoperative hypothyroidism Stable. Last TSH normal. Continue levothyroxine 45mcg daily and monitor. Recheck TSH   5. Essential hypertension, benign Well controlled. Continue atenolol 25mg  daily. Continue to monitor BP and her status   6. RLS (restless legs syndrome) Stable. Continue requip 0.5mg  twice daily at 6am and 12 noon with 2mg  daily at bedtime.  7. Vasomotor rhinitis Stable. Continue asteline 0.1% nasal spray daily and zyrtec 10mg  daily.   8. Folate deficiency No issues. Continue folate 1mg  daily and monitor.   9. Chronic venous insufficiency Stable. Continue TED hose to BLE daily while awake. Encourage elevating BLE while in bed. Continue to monitor   10. Sjogren's syndrome Stable. Continue pilocarpine 5mg  three times daily and liquitears to each eye twice daily  11. CKD stage 3 Stable. Avoid nephrotoxic agents,  especially NSAIDs. Continue to monitor renal function.  Labs ordered: TSH  Family/Staff Communication Plan of care discussed with son and nursing staff. Son and nursing staff verbalized understanding and agree with plan of care. No additional questions or concerns reported.    Arthur Holms, MSN, AGNP-C [facility] [address] [place], [place] [address] [phone] [8am-5pm] After hours: [phone]

## 2015-02-18 LAB — TSH: TSH: 3.58 u[IU]/mL (ref 0.41–5.90)

## 2015-03-18 ENCOUNTER — Encounter: Payer: Self-pay | Admitting: Registered Nurse

## 2015-03-18 ENCOUNTER — Non-Acute Institutional Stay (SKILLED_NURSING_FACILITY): Payer: Medicare Other | Admitting: Registered Nurse

## 2015-03-18 DIAGNOSIS — F0280 Dementia in other diseases classified elsewhere without behavioral disturbance: Secondary | ICD-10-CM

## 2015-03-18 DIAGNOSIS — J300 Vasomotor rhinitis: Secondary | ICD-10-CM | POA: Diagnosis not present

## 2015-03-18 DIAGNOSIS — E890 Postprocedural hypothyroidism: Secondary | ICD-10-CM

## 2015-03-18 DIAGNOSIS — I872 Venous insufficiency (chronic) (peripheral): Secondary | ICD-10-CM | POA: Diagnosis not present

## 2015-03-18 DIAGNOSIS — E538 Deficiency of other specified B group vitamins: Secondary | ICD-10-CM

## 2015-03-18 DIAGNOSIS — F0390 Unspecified dementia without behavioral disturbance: Secondary | ICD-10-CM | POA: Diagnosis not present

## 2015-03-18 DIAGNOSIS — D638 Anemia in other chronic diseases classified elsewhere: Secondary | ICD-10-CM | POA: Diagnosis not present

## 2015-03-18 DIAGNOSIS — N183 Chronic kidney disease, stage 3 unspecified: Secondary | ICD-10-CM

## 2015-03-18 DIAGNOSIS — S81819A Laceration without foreign body, unspecified lower leg, initial encounter: Secondary | ICD-10-CM | POA: Diagnosis not present

## 2015-03-18 DIAGNOSIS — G2581 Restless legs syndrome: Secondary | ICD-10-CM | POA: Diagnosis not present

## 2015-03-18 DIAGNOSIS — I10 Essential (primary) hypertension: Secondary | ICD-10-CM | POA: Diagnosis not present

## 2015-03-18 DIAGNOSIS — M3500 Sicca syndrome, unspecified: Secondary | ICD-10-CM

## 2015-03-18 DIAGNOSIS — F329 Major depressive disorder, single episode, unspecified: Secondary | ICD-10-CM | POA: Diagnosis not present

## 2015-03-18 DIAGNOSIS — F0393 Unspecified dementia, unspecified severity, with mood disturbance: Secondary | ICD-10-CM

## 2015-03-18 NOTE — Progress Notes (Signed)
Patient ID: [name] [name], female   DOB: 10-21-1919, 81 y.o.   MRN: [id number]   Place of Service: [facility]  Allergies  Allergen Reactions  . Codeine Other (See Comments)    insomnia  . Doxycycline Diarrhea and Other (See Comments)    Hallucinations  . Fentanyl Hives and Rash  . Lisinopril Nausea And Vomiting, Rash and Hypertension    Code Status: DNR  Goals of Care: Comfort and Quality of Life/LTC  Chief Complaint  Patient presents with  . Medical Management of Chronic Issues    Dementia, depression, HTN, RLS, CKD3, folate deficiency, chronic venoous insufficinecy     HPI 81 y.o. female with PMH of dementia, macular degeneration (legally blind), OA, depression, GERD, HTN, anemia among others is being seen for a routine visit for management of her chronic issues. Weight stable over the past 30 days.. No recent fall reported. Has skin tears to both lower legs. No change in behaviors or functional status reported. No concerns from staff. Seen in wheelchair today. Unable to participate in HPI and ROS but denies any pain or discomfort. BP slightly low today but mostly 112-130/60-70s. Dementia is advanced. Mood stable with zoloft. RLS stable on requip. Hypothyroidism stable with current dose of levothyroxine. CKD3 stable. Folate deficiency stable on folate supplement  Review of Systems Unable to obtain due to dementia  Past Medical History  Diagnosis Date  . Arthritis   . History of chicken pox   . Depression     husband died 20-Sep-2011  . Glaucoma   . Thyroid disease   . Hypertension   . Hypokalemia   . Renal disorder   . Altered mental state   . Blind   . Macular degeneration   . Abrasion of arm, left 02/04/2013  . Compression fracture of T12 vertebra 03/21/2013  . Debility 03/21/2013  . Nasal congestion 03/21/2013  . Vasomotor rhinitis 03/21/2013  . Abnormal LFTs 03/21/2013  . Dental abscess 06/06/2013  . Thrush 08/02/2013  . Paronychia 10/11/2013  .  Osteoarthritis of ankle and foot 01/12/2014  . RLS (restless legs syndrome) 01/12/2014  . Sacral decubitus ulcer, stage III 08/06/2012  . C. difficile diarrhea 09/12/2012    Past Surgical History  Procedure Laterality Date  . Revision total knee arthroplasty      right knee  . Back surgery    . Cataract extraction, bilateral    . Thyroidectomy    . Abdominal hysterectomy      History   Social History  . Marital Status: Widowed    Spouse Name: N/A  . Number of Children: N/A  . Years of Education: N/A   Occupational History  . Not on file.   Social History Main Topics  . Smoking status: Never Smoker   . Smokeless tobacco: Never Used  . Alcohol Use: No  . Drug Use: No  . Sexual Activity: No   Other Topics Concern  . Not on file   Social History Narrative   She is retired from working in a [employer].   Originally from [place] and immigrated from [place] in 1950   She was living in [place] until 2012 with her husband, until he passed away and then moved here.    Family History  Problem Relation Age of Onset  . Other Father     Killed in war  . Other Mother     Deceased, 40  . Breast cancer Daughter   . Hypertension Son   . Hyperlipidemia Son  Medication List       This list is accurate as of: 03/18/15  2:14 PM.  Always use your most recent med list.               atenolol 50 MG tablet  Commonly known as:  TENORMIN  Take 0.5 tablets (25 mg total) by mouth daily.     azelastine 0.1 % nasal spray  Commonly known as:  ASTELIN  Place 2 sprays into both nostrils daily. Use in each nostril as directed     cetirizine 10 MG tablet  Commonly known as:  ZYRTEC  Take 1 tablet (10 mg total) by mouth daily.     folic acid 1 MG tablet  Commonly known as:  FOLVITE  Take 1 mg by mouth daily.     gabapentin 300 MG capsule  Commonly known as:  NEURONTIN  Take 300 mg by mouth 2 (two) times daily.     levothyroxine 88 MCG tablet  Commonly known as:  SYNTHROID,  LEVOTHROID  Take 88 mcg by mouth daily before breakfast.     magnesium oxide 400 MG tablet  Commonly known as:  MAG-OX  Take 400 mg by mouth daily.     pilocarpine 5 MG tablet  Commonly known as:  SALAGEN  Take 5 mg by mouth 3 (three) times daily.     polyvinyl alcohol 1.4 % ophthalmic solution  Commonly known as:  LIQUIFILM TEARS  Place 1 drop into both eyes 2 (two) times daily. Wait 3-5 minutes between eye drops.     rOPINIRole 1 MG tablet  Commonly known as:  REQUIP  Take 0.5-2 mg by mouth 3 (three) times daily. 0.5mg  twice daily at 6am and 12noon with 2mg  daily at bedtime     saccharomyces boulardii 250 MG capsule  Commonly known as:  FLORASTOR  Take 250 mg by mouth 2 (two) times daily.     sertraline 100 MG tablet  Commonly known as:  ZOLOFT  Take 100 mg by mouth daily.        Physical Exam  BP 101/58 mmHg  Pulse 64  Temp(Src) 97 F (36.1 C)  Resp 18  Ht 5\' 2"  (1.575 m)  Wt 121 lb 9.6 oz (55.157 kg)  BMI 22.24 kg/m2  SpO2 93%  Constitutional: Thin/frail elderly female in no acute distress. Stooped posture HEENT: Normocephalic and atraumatic. PERRL. Clear nasal discharge noted. No sinus tenderness. Oral mucosa moist.  Neck: No lymphadenopathy, masses, or thyromegaly. No JVD or carotid bruits. Cardiac: Normal S1, S2. RRR without appreciable murmurs, rubs, or gallops. Distal pulses intact. 1+ pitting edema of BLE with TED hose in place Lungs: No respiratory distress. Breath sounds clear bilaterally without rales, rhonchi, or wheezes. Abdomen: Audible bowel sounds in all quadrants. Soft, nontender, nondistended. Musculoskeletal: Anterocollis. Marked kyphosis. No joint erythema or tenderness. Skin: cool and dry. Hyperpigmentation of BLE noted. Skin tears noted on BLE Neurological: Alert  Psychiatric: flat affect.  Labs Reviewed  CBC Latest Ref Rng 11/18/2014 08/27/2014 07/02/2014  WBC - 6.0 5.7 5.9  Hemoglobin 12.0 - 16.0 g/dL 11.4(A) 12.6 11.6(A)  Hematocrit  36 - 46 % 34(A) 35.8(L) 34(A)  Platelets 150 - 399 K/L 138(A) 223 169    CMP Latest Ref Rng 11/18/2014 08/27/2014 07/02/2014  Glucose 70 - 99 mg/dL - 91 -  BUN 4 - 21 mg/dL 14 14 18   Creatinine 0.5 - 1.1 mg/dL 1.1 1.07 1.0  Sodium 137 - 147 mmol/L 141 138 141  Potassium 3.4 -  5.3 mmol/L 4.1 4.5 3.9  Chloride 96 - 112 mEq/L - 105 -  CO2 19 - 32 mEq/L - 22 -  Calcium 8.4 - 10.5 mg/dL - 9.0 -  Total Protein 6.0 - 8.3 g/dL - - -  Total Bilirubin 0.2 - 1.2 mg/dL - - -  Alkaline Phos 25 - 125 U/L - - 149(A)  AST 13 - 35 U/L - - 10(A)  ALT 7 - 35 U/L - - 7    Lab Results  Component Value Date   TSH 3.58 02/18/2015    Lipid Panel     Component Value Date/Time   TRIG 97 07/02/2014   LDLCALC 156 07/02/2014    Assessment & Plan 1. Anemia, chronic disease Stable. Last h&h 11.4/34.2. Continue to monitor h&h  2. Dementia, without behavioral disturbance Advanced. Further decline anticipated. Continue to monitor for change in mood/behavior. Continue assist with ADLs and fall risk and pressure ulcer precautions.  3. Depression due to dementia Mood stable. Continue zoloft 100mg  daily and monitor for change in mood  4. Postoperative hypothyroidism Stable. TSH normal. Continue levothyroxine 55mcg daily   5. Essential hypertension, benign Soft today, but overall stable. Continue atenolol 25mg  daily. Continue to monitor BP and her status   6. RLS (restless legs syndrome) Stable. Continue requip 0.5mg  twice daily at 6am and 12 noon with 2mg  daily at bedtime with gabapentin 300mg  twice daily.  7. Vasomotor rhinitis Stable. Continue asteline 0.1% nasal spray daily and zyrtec 10mg  daily.   8. Folate deficiency Stable. Continue folate 1mg  daily and monitor. Recheck folate level  9. Chronic venous insufficiency Stable. Continue TED hose to BLE daily while awake. Encourage elevating BLE while in bed. Continue to monitor   10. Sjogren's syndrome Stable. Continue pilocarpine 5mg  three  times daily and liquitears to each eye twice daily  11. CKD stage 3 Stable. Avoid nephrotoxic agents, especially NSAIDs. Continue to monitor renal function.  12. Skin tears to BLE No complications. Continue skin/wound care and monitor for signs of infections. Continue to monitor her status.   Labs ordered: cbc and folate level  Family/Staff Communication Plan of care discussed with nursing staff. Nursing staff verbalized understanding and agree with plan of care. No additional questions or concerns reported.    Arthur Holms, MSN, AGNP-C [facility] [address] [place], [place] [address] [phone] [8am-5pm] After hours: [phone]

## 2015-03-21 LAB — CBC AND DIFFERENTIAL
HEMATOCRIT: 32 % — AB (ref 36–46)
Hemoglobin: 10.9 g/dL — AB (ref 12.0–16.0)
PLATELETS: 170 10*3/uL (ref 150–399)
WBC: 4.7 10^3/mL

## 2015-04-06 ENCOUNTER — Encounter: Payer: Self-pay | Admitting: Registered Nurse

## 2015-04-06 ENCOUNTER — Non-Acute Institutional Stay (SKILLED_NURSING_FACILITY): Payer: Medicare Other | Admitting: Registered Nurse

## 2015-04-06 DIAGNOSIS — M542 Cervicalgia: Secondary | ICD-10-CM | POA: Diagnosis not present

## 2015-04-06 NOTE — Progress Notes (Signed)
Patient ID: [name] [name], female   DOB: [birth year], 81 y.o.   MRN: [id number]   Place of Service: [facility]  Allergies  Allergen Reactions  . Codeine Other (See Comments)    insomnia  . Doxycycline Diarrhea and Other (See Comments)    Hallucinations  . Fentanyl Hives and Rash  . Lisinopril Nausea And Vomiting, Rash and Hypertension    Code Status: DNR  Goals of Care: Comfort and Quality of Life/LTC  Chief Complaint  Patient presents with  . Acute Visit    right neck pain    HPI 81 y.o. female with PMH of dementia, macular degeneration (legally blind), OA, depression, GERD, HTN, anemia among others is being seen for an acute visit at the request of son for the evaluation of right neck pain. Seen in wheelchair. Reported having soreness of right neck area and would like something for pain. Unable to describe symptom in further details.   Review of Systems Unable to obtain due to dementia  Past Medical History  Diagnosis Date  . Arthritis   . History of chicken pox   . Depression     husband died 09-13-2011  . Glaucoma   . Thyroid disease   . Hypertension   . Hypokalemia   . Renal disorder   . Altered mental state   . Blind   . Macular degeneration   . Abrasion of arm, left 02/04/2013  . Compression fracture of T12 vertebra 03/21/2013  . Debility 03/21/2013  . Nasal congestion 03/21/2013  . Vasomotor rhinitis 03/21/2013  . Abnormal LFTs 03/21/2013  . Dental abscess 06/06/2013  . Thrush 08/02/2013  . Paronychia 10/11/2013  . Osteoarthritis of ankle and foot 01/12/2014  . RLS (restless legs syndrome) 01/12/2014  . Sacral decubitus ulcer, stage III 08/06/2012  . C. difficile diarrhea 09/12/2012    Past Surgical History  Procedure Laterality Date  . Revision total knee arthroplasty      right knee  . Back surgery    . Cataract extraction, bilateral    . Thyroidectomy    . Abdominal hysterectomy      History   Social History  . Marital Status: Widowed   Spouse Name: N/A  . Number of Children: N/A  . Years of Education: N/A   Occupational History  . Not on file.   Social History Main Topics  . Smoking status: Never Smoker   . Smokeless tobacco: Never Used  . Alcohol Use: No  . Drug Use: No  . Sexual Activity: No   Other Topics Concern  . Not on file   Social History Narrative   She is retired from working in a [employer].   Originally from [place] and immigrated from [place] in 1950   She was living in [place] until 2012 with her husband, until he passed away and then moved here.    Family History  Problem Relation Age of Onset  . Other Father     Killed in war  . Other Mother     Deceased, 7  . Breast cancer Daughter   . Hypertension Son   . Hyperlipidemia Son       Medication List       This list is accurate as of: 04/06/15 11:59 PM.  Always use your most recent med list.               atenolol 50 MG tablet  Commonly known as:  TENORMIN  Take 0.5 tablets (25 mg total) by  mouth daily.     azelastine 0.1 % nasal spray  Commonly known as:  ASTELIN  Place 2 sprays into both nostrils daily. Use in each nostril as directed     cetirizine 10 MG tablet  Commonly known as:  ZYRTEC  Take 1 tablet (10 mg total) by mouth daily.     folic acid 1 MG tablet  Commonly known as:  FOLVITE  Take 1 mg by mouth daily.     gabapentin 300 MG capsule  Commonly known as:  NEURONTIN  Take 300 mg by mouth 2 (two) times daily.     levothyroxine 88 MCG tablet  Commonly known as:  SYNTHROID, LEVOTHROID  Take 88 mcg by mouth daily before breakfast.     magnesium oxide 400 MG tablet  Commonly known as:  MAG-OX  Take 400 mg by mouth daily.     pilocarpine 5 MG tablet  Commonly known as:  SALAGEN  Take 5 mg by mouth 3 (three) times daily.     polyvinyl alcohol 1.4 % ophthalmic solution  Commonly known as:  LIQUIFILM TEARS  Place 1 drop into both eyes 2 (two) times daily. Wait 3-5 minutes between eye drops.     rOPINIRole 1 MG  tablet  Commonly known as:  REQUIP  Take 0.5-2 mg by mouth 3 (three) times daily. 0.5mg  twice daily at 6am and 12noon with 2mg  daily at bedtime     saccharomyces boulardii 250 MG capsule  Commonly known as:  FLORASTOR  Take 250 mg by mouth 2 (two) times daily.     sertraline 100 MG tablet  Commonly known as:  ZOLOFT  Take 100 mg by mouth daily.        Physical Exam  BP 112/64 mmHg  Pulse 62  Temp(Src) 97 F (36.1 C)  Resp 16  Constitutional: Thin/frail elderly female in no acute distress. Stooped posture HEENT: Normocephalic and atraumatic.  Neck: No lymphadenopathy, masses, or thyromegaly. No JVD or carotid bruits. Right lateral neck tender to palpation. Cardiac: Normal S1, S2. RRR without appreciable murmurs, rubs, or gallops.  Lungs: No respiratory distress. Breath sounds clear bilaterally without rales, rhonchi, or wheezes. Abdomen: Audible bowel sounds in all quadrants. Soft, nontender, nondistended. Musculoskeletal: Anterocollis. Marked kyphosis.   Labs Reviewed  CBC Latest Ref Rng 11/18/2014 08/27/2014 07/02/2014  WBC - 6.0 5.7 5.9  Hemoglobin 12.0 - 16.0 g/dL 11.4(A) 12.6 11.6(A)  Hematocrit 36 - 46 % 34(A) 35.8(L) 34(A)  Platelets 150 - 399 K/L 138(A) 223 169    CMP Latest Ref Rng 11/18/2014 08/27/2014 07/02/2014  Glucose 70 - 99 mg/dL - 91 -  BUN 4 - 21 mg/dL 14 14 18   Creatinine 0.5 - 1.1 mg/dL 1.1 1.07 1.0  Sodium 137 - 147 mmol/L 141 138 141  Potassium 3.4 - 5.3 mmol/L 4.1 4.5 3.9  Chloride 96 - 112 mEq/L - 105 -  CO2 19 - 32 mEq/L - 22 -  Calcium 8.4 - 10.5 mg/dL - 9.0 -  Total Protein 6.0 - 8.3 g/dL - - -  Total Bilirubin 0.2 - 1.2 mg/dL - - -  Alkaline Phos 25 - 125 U/L - - 149(A)  AST 13 - 35 U/L - - 10(A)  ALT 7 - 35 U/L - - 7    Assessment & Plan 1. Neck pain on right side Of unknown duration. No injury or trauma reported from nursing staff. Tylenol 650mg  every 4 hours as needed for pain. Reassess and continue to monitor.   Family/Staff  Communication Plan of care discussed with nursing staff. Nursing staff verbalized understanding and agree with plan of care. No additional questions or concerns reported.    Arthur Holms, MSN, AGNP-C [facility] [address] [place], [place] [address] [phone] [8am-5pm] After hours: [phone]

## 2015-04-13 ENCOUNTER — Non-Acute Institutional Stay (SKILLED_NURSING_FACILITY): Payer: Medicare Other | Admitting: Registered Nurse

## 2015-04-13 ENCOUNTER — Encounter: Payer: Self-pay | Admitting: Registered Nurse

## 2015-04-13 DIAGNOSIS — D638 Anemia in other chronic diseases classified elsewhere: Secondary | ICD-10-CM | POA: Diagnosis not present

## 2015-04-13 DIAGNOSIS — I10 Essential (primary) hypertension: Secondary | ICD-10-CM | POA: Diagnosis not present

## 2015-04-13 DIAGNOSIS — M3500 Sicca syndrome, unspecified: Secondary | ICD-10-CM | POA: Diagnosis not present

## 2015-04-13 DIAGNOSIS — I872 Venous insufficiency (chronic) (peripheral): Secondary | ICD-10-CM

## 2015-04-13 DIAGNOSIS — F329 Major depressive disorder, single episode, unspecified: Secondary | ICD-10-CM

## 2015-04-13 DIAGNOSIS — E538 Deficiency of other specified B group vitamins: Secondary | ICD-10-CM

## 2015-04-13 DIAGNOSIS — N183 Chronic kidney disease, stage 3 unspecified: Secondary | ICD-10-CM

## 2015-04-13 DIAGNOSIS — F0390 Unspecified dementia without behavioral disturbance: Secondary | ICD-10-CM

## 2015-04-13 DIAGNOSIS — E890 Postprocedural hypothyroidism: Secondary | ICD-10-CM

## 2015-04-13 DIAGNOSIS — G2581 Restless legs syndrome: Secondary | ICD-10-CM | POA: Diagnosis not present

## 2015-04-13 DIAGNOSIS — F0393 Unspecified dementia, unspecified severity, with mood disturbance: Secondary | ICD-10-CM

## 2015-04-13 DIAGNOSIS — E46 Unspecified protein-calorie malnutrition: Secondary | ICD-10-CM | POA: Diagnosis not present

## 2015-04-13 DIAGNOSIS — F0280 Dementia in other diseases classified elsewhere without behavioral disturbance: Secondary | ICD-10-CM

## 2015-04-13 DIAGNOSIS — J300 Vasomotor rhinitis: Secondary | ICD-10-CM

## 2015-04-13 NOTE — Progress Notes (Signed)
Patient ID: [name] [name], female   DOB: [birth year], 81 y.o.   MRN: [id number]   Place of Service: [facility]  Allergies  Allergen Reactions  . Codeine Other (See Comments)    insomnia  . Doxycycline Diarrhea and Other (See Comments)    Hallucinations  . Fentanyl Hives and Rash  . Lisinopril Nausea And Vomiting, Rash and Hypertension    Code Status: DNR  Goals of Care: Comfort and Quality of Life/LTC  Chief Complaint  Patient presents with  . Medical Management of Chronic Issues    dementia, depression, anemia, folic acid def, chronic venous insuf., htn, ckd3, hypothyroidism    HPI 81 y.o. female with PMH of dementia, macular degeneration (legally blind), OA, depression, GERD, HTN, anemia among others is being seen for a routine visit for management of her chronic issues. Weight stable over the past 30 days.. No recent fall or new skin concerns reported. No change in behaviors or functional status reported. No concerns from staff. HTN adequately controlled with bp range 112-130s/50-70s. Anemia stable with recent hgb 10.9. Dementia is advanced. Mood stable with zoloft. RLS stable on requip. Hypothyroidism stable on levothyroxine. Folate deficiency stable on folate supplement-recent folate level normal. Seen in wheelchair today. Unable to participate in HPI and ROS but denies any pain or discomfort.  Review of Systems Unable to obtain due to dementia  Past Medical History  Diagnosis Date  . Arthritis   . History of chicken pox   . Depression     husband died 09/11/2011  . Glaucoma   . Thyroid disease   . Hypertension   . Hypokalemia   . Renal disorder   . Altered mental state   . Blind   . Macular degeneration   . Abrasion of arm, left 02/04/2013  . Compression fracture of T12 vertebra 03/21/2013  . Debility 03/21/2013  . Nasal congestion 03/21/2013  . Vasomotor rhinitis 03/21/2013  . Abnormal LFTs 03/21/2013  . Dental abscess 06/06/2013  . Thrush 08/02/2013  .  Paronychia 10/11/2013  . Osteoarthritis of ankle and foot 01/12/2014  . RLS (restless legs syndrome) 01/12/2014  . Sacral decubitus ulcer, stage III 08/06/2012  . C. difficile diarrhea 09/12/2012    Past Surgical History  Procedure Laterality Date  . Revision total knee arthroplasty      right knee  . Back surgery    . Cataract extraction, bilateral    . Thyroidectomy    . Abdominal hysterectomy      History   Social History  . Marital Status: Widowed    Spouse Name: N/A  . Number of Children: N/A  . Years of Education: N/A   Occupational History  . Not on file.   Social History Main Topics  . Smoking status: Never Smoker   . Smokeless tobacco: Never Used  . Alcohol Use: No  . Drug Use: No  . Sexual Activity: No   Other Topics Concern  . Not on file   Social History Narrative   She is retired from working in a [employer].   Originally from [place] and immigrated from [place] in 1950   She was living in [place] until 2012 with her husband, until he passed away and then moved here.    Family History  Problem Relation Age of Onset  . Other Father     Killed in war  . Other Mother     Deceased, 35  . Breast cancer Daughter   . Hypertension Son   .  Hyperlipidemia Son       Medication List       This list is accurate as of: 04/13/15  4:07 PM.  Always use your most recent med list.               atenolol 50 MG tablet  Commonly known as:  TENORMIN  Take 0.5 tablets (25 mg total) by mouth daily.     azelastine 0.1 % nasal spray  Commonly known as:  ASTELIN  Place 2 sprays into both nostrils daily. Use in each nostril as directed     cetirizine 10 MG tablet  Commonly known as:  ZYRTEC  Take 1 tablet (10 mg total) by mouth daily.     ferrous sulfate 325 (65 FE) MG tablet  Take 325 mg by mouth 2 (two) times daily with a meal.     folic acid 1 MG tablet  Commonly known as:  FOLVITE  Take 1 mg by mouth daily.     gabapentin 300 MG capsule  Commonly known as:   NEURONTIN  Take 300 mg by mouth 2 (two) times daily.     levothyroxine 88 MCG tablet  Commonly known as:  SYNTHROID, LEVOTHROID  Take 88 mcg by mouth daily before breakfast.     magnesium oxide 400 MG tablet  Commonly known as:  MAG-OX  Take 400 mg by mouth daily.     pilocarpine 5 MG tablet  Commonly known as:  SALAGEN  Take 5 mg by mouth 3 (three) times daily.     polyvinyl alcohol 1.4 % ophthalmic solution  Commonly known as:  LIQUIFILM TEARS  Place 1 drop into both eyes 2 (two) times daily. Wait 3-5 minutes between eye drops.     rOPINIRole 1 MG tablet  Commonly known as:  REQUIP  Take 0.5-2 mg by mouth 3 (three) times daily. 0.5mg  twice daily at 6am and 12noon with 2mg  daily at bedtime     saccharomyces boulardii 250 MG capsule  Commonly known as:  FLORASTOR  Take 250 mg by mouth 2 (two) times daily.     sertraline 100 MG tablet  Commonly known as:  ZOLOFT  Take 100 mg by mouth daily.        Physical Exam  BP 117/62 mmHg  Pulse 61  Temp(Src) 97.5 F (36.4 C)  Resp 16  Ht 5\' 2"  (1.575 m)  Wt 121 lb 9.6 oz (55.157 kg)  BMI 22.24 kg/m2  Constitutional: Thin/frail elderly female in no acute distress. Stooped posture with garbled speech HEENT: Normocephalic and atraumatic. PERRL. No sinus tenderness. Oral mucosa moist.  Neck: No lymphadenopathy, masses, or thyromegaly. No JVD or carotid bruits. Cardiac: Normal S1, S2. RRR without appreciable murmurs, rubs, or gallops. Distal pulses intact. 1+ pitting edema of BLE with TED hose in place Lungs: No respiratory distress. Breath sounds clear bilaterally without rales, rhonchi, or wheezes. Abdomen: Audible bowel sounds in all quadrants. Soft, nontender, nondistended. Musculoskeletal: Anterocollis. Marked kyphosis. Wheelchair bound Skin: cool and dry. Hyperpigmentation of BLE noted.  Neurological: Alert  Psychiatric: appropriate mood and affect  Labs Reviewed  CBC Latest Ref Rng 03/21/2015 11/18/2014 08/27/2014  WBC  - 4.7 6.0 5.7  Hemoglobin 12.0 - 16.0 g/dL 10.9(A) 11.4(A) 12.6  Hematocrit 36 - 46 % 32(A) 34(A) 35.8(L)  Platelets 150 - 399 K/L 170 138(A) 223    CMP Latest Ref Rng 11/18/2014 08/27/2014 07/02/2014  Glucose 70 - 99 mg/dL - 91 -  BUN 4 - 21 mg/dL 14 14  18  Creatinine 0.5 - 1.1 mg/dL 1.1 1.07 1.0  Sodium 137 - 147 mmol/L 141 138 141  Potassium 3.4 - 5.3 mmol/L 4.1 4.5 3.9  Chloride 96 - 112 mEq/L - 105 -  CO2 19 - 32 mEq/L - 22 -  Calcium 8.4 - 10.5 mg/dL - 9.0 -  Total Protein 6.0 - 8.3 g/dL - - -  Total Bilirubin 0.2 - 1.2 mg/dL - - -  Alkaline Phos 25 - 125 U/L - - 149(A)  AST 13 - 35 U/L - - 10(A)  ALT 7 - 35 U/L - - 7    Lab Results  Component Value Date   TSH 3.58 02/18/2015    Lipid Panel     Component Value Date/Time   TRIG 97 07/02/2014   LDLCALC 156 07/02/2014    Assessment & Plan 1. Anemia, chronic disease Stable. Recent hbg 10.9. Start ferrous sulfate 325mg  twice daily. Recheck cbc in 6 weeks  2. Dementia, without behavioral disturbance Advanced. Further decline anticipated.  Continue assist with ADLs with fall risk and pressure ulcer precautions. Monitor for change in behavior  3. Depression due to dementia Mood stable. Continue zoloft 100mg  daily and monitor for change in mood  4. Postoperative hypothyroidism TSH normal. Continue levothyroxine 72mcg daily   5. Essential hypertension, benign Controlled. Continue atenolol 25mg  daily. Monitor bp  6. RLS (restless legs syndrome) Stable. Continue requip 0.5mg  twice daily at 6am and 12 noon with 2mg  daily at bedtime with gabapentin 300mg  twice daily.  7. Vasomotor rhinitis Stable. Continue asteline 0.1% nasal spray daily and zyrtec 10mg  daily.   8. Folate deficiency Stable. Recent folate level on 03/21/15 >20. Continue folate 1mg  daily and monitor.   9. Chronic venous insufficiency Ongoing. Continue TED hose to BLE daily while awake. Encourage elevating BLE while in bed.   10. Sjogren's  syndrome Stable. Continue pilocarpine 5mg  three times daily and liquitears to each eye twice daily  11. CKD stage 3 Stable. Avoid nephrotoxic agents, especially NSAIDs. Continue to monitor renal function. Recheck cmp  12. Protein calorie malnutrition Gradual weight loss. Weight stable over the past 30 days. Continue current diet and dietary supplement. Continue to monitor weight and her nutritional status.   Family/Staff Communication Plan of care discussed with nursing staff. Nursing staff verbalized understanding and agree with plan of care. No additional questions or concerns reported.    Arthur Holms, MSN, AGNP-C [facility] [address] [place], [place] [address] [phone] [8am-5pm] After hours: [phone]

## 2015-04-13 NOTE — Progress Notes (Deleted)
Patient ID: [name] [name], female   DOB: [birth year], 81 y.o.   MRN: [id number]   Place of Service: [facility]  Allergies  Allergen Reactions  . Codeine Other (See Comments)    insomnia  . Doxycycline Diarrhea and Other (See Comments)    Hallucinations  . Fentanyl Hives and Rash  . Lisinopril Nausea And Vomiting, Rash and Hypertension    Code Status: DNR  Goals of Care: Comfort and Quality of Life/LTC  Chief Complaint  Patient presents with  . Medical Management of Chronic Issues    dementia, depression, anemia, folic acid def, chronic venous insuf., htn, ckd3, hypothyroidism    HPI 81 y.o. female with PMH of dementia, macular degeneration (legally blind), OA, depression, GERD, HTN, anemia among others is being seen for an acute visit at the request of son for the evaluation of right neck pain. Seen in wheelchair. Reported having soreness of right neck area and would like something for pain. Unable to describe symptom in further details.   Review of Systems Unable to obtain due to dementia  Past Medical History  Diagnosis Date  . Arthritis   . History of chicken pox   . Depression     husband died 15-Sep-2011  . Glaucoma   . Thyroid disease   . Hypertension   . Hypokalemia   . Renal disorder   . Altered mental state   . Blind   . Macular degeneration   . Abrasion of arm, left 02/04/2013  . Compression fracture of T12 vertebra 03/21/2013  . Debility 03/21/2013  . Nasal congestion 03/21/2013  . Vasomotor rhinitis 03/21/2013  . Abnormal LFTs 03/21/2013  . Dental abscess 06/06/2013  . Thrush 08/02/2013  . Paronychia 10/11/2013  . Osteoarthritis of ankle and foot 01/12/2014  . RLS (restless legs syndrome) 01/12/2014  . Sacral decubitus ulcer, stage III 08/06/2012  . C. difficile diarrhea 09/12/2012    Past Surgical History  Procedure Laterality Date  . Revision total knee arthroplasty      right knee  . Back surgery    . Cataract extraction, bilateral    .  Thyroidectomy    . Abdominal hysterectomy      History   Social History  . Marital Status: Widowed    Spouse Name: N/A  . Number of Children: N/A  . Years of Education: N/A   Occupational History  . Not on file.   Social History Main Topics  . Smoking status: Never Smoker   . Smokeless tobacco: Never Used  . Alcohol Use: No  . Drug Use: No  . Sexual Activity: No   Other Topics Concern  . Not on file   Social History Narrative   She is retired from working in a [employer].   Originally from [place] and immigrated from [place] in 1950   She was living in [place] until 2012 with her husband, until he passed away and then moved here.    Family History  Problem Relation Age of Onset  . Other Father     Killed in war  . Other Mother     Deceased, 6  . Breast cancer Daughter   . Hypertension Son   . Hyperlipidemia Son       Medication List       This list is accurate as of: 04/13/15  4:05 PM.  Always use your most recent med list.               atenolol 50 MG  tablet  Commonly known as:  TENORMIN  Take 0.5 tablets (25 mg total) by mouth daily.     azelastine 0.1 % nasal spray  Commonly known as:  ASTELIN  Place 2 sprays into both nostrils daily. Use in each nostril as directed     cetirizine 10 MG tablet  Commonly known as:  ZYRTEC  Take 1 tablet (10 mg total) by mouth daily.     ferrous sulfate 325 (65 FE) MG tablet  Take 325 mg by mouth 2 (two) times daily with a meal.     folic acid 1 MG tablet  Commonly known as:  FOLVITE  Take 1 mg by mouth daily.     gabapentin 300 MG capsule  Commonly known as:  NEURONTIN  Take 300 mg by mouth 2 (two) times daily.     levothyroxine 88 MCG tablet  Commonly known as:  SYNTHROID, LEVOTHROID  Take 88 mcg by mouth daily before breakfast.     magnesium oxide 400 MG tablet  Commonly known as:  MAG-OX  Take 400 mg by mouth daily.     pilocarpine 5 MG tablet  Commonly known as:  SALAGEN  Take 5 mg by mouth 3 (three)  times daily.     polyvinyl alcohol 1.4 % ophthalmic solution  Commonly known as:  LIQUIFILM TEARS  Place 1 drop into both eyes 2 (two) times daily. Wait 3-5 minutes between eye drops.     rOPINIRole 1 MG tablet  Commonly known as:  REQUIP  Take 0.5-2 mg by mouth 3 (three) times daily. 0.5mg  twice daily at 6am and 12noon with 2mg  daily at bedtime     saccharomyces boulardii 250 MG capsule  Commonly known as:  FLORASTOR  Take 250 mg by mouth 2 (two) times daily.     sertraline 100 MG tablet  Commonly known as:  ZOLOFT  Take 100 mg by mouth daily.        Physical Exam  BP 117/62 mmHg  Pulse 61  Temp(Src) 97.5 F (36.4 C)  Resp 16  Ht 5\' 2"  (1.575 m)  Wt 121 lb 9.6 oz (55.157 kg)  BMI 22.24 kg/m2  Constitutional: Thin/frail elderly female in no acute distress. Stooped posture HEENT: Normocephalic and atraumatic.  Neck: No lymphadenopathy, masses, or thyromegaly. No JVD or carotid bruits. Right lateral neck tender to palpation. Cardiac: Normal S1, S2. RRR without appreciable murmurs, rubs, or gallops.  Lungs: No respiratory distress. Breath sounds clear bilaterally without rales, rhonchi, or wheezes. Abdomen: Audible bowel sounds in all quadrants. Soft, nontender, nondistended. Musculoskeletal: Anterocollis. Marked kyphosis.   Labs Reviewed  CBC Latest Ref Rng 03/21/2015 11/18/2014 08/27/2014  WBC - 4.7 6.0 5.7  Hemoglobin 12.0 - 16.0 g/dL 10.9(A) 11.4(A) 12.6  Hematocrit 36 - 46 % 32(A) 34(A) 35.8(L)  Platelets 150 - 399 K/L 170 138(A) 223    CMP Latest Ref Rng 11/18/2014 08/27/2014 07/02/2014  Glucose 70 - 99 mg/dL - 91 -  BUN 4 - 21 mg/dL 14 14 18   Creatinine 0.5 - 1.1 mg/dL 1.1 1.07 1.0  Sodium 137 - 147 mmol/L 141 138 141  Potassium 3.4 - 5.3 mmol/L 4.1 4.5 3.9  Chloride 96 - 112 mEq/L - 105 -  CO2 19 - 32 mEq/L - 22 -  Calcium 8.4 - 10.5 mg/dL - 9.0 -  Total Protein 6.0 - 8.3 g/dL - - -  Total Bilirubin 0.2 - 1.2 mg/dL - - -  Alkaline Phos 25 - 125 U/L - -  149(A)  AST 13 - 35 U/L - - 10(A)  ALT 7 - 35 U/L - - 7    Assessment & Plan 1. Neck pain on right side Of unknown duration. No injury or trauma reported from nursing staff. Tylenol 650mg  every 4 hours as needed for pain. Reassess and continue to monitor.   Family/Staff Communication Plan of care discussed with nursing staff. Nursing staff verbalized understanding and agree with plan of care. No additional questions or concerns reported.    Arthur Holms, MSN, AGNP-C [facility] [address] [place], [place] [address] [phone] [8am-5pm] After hours: [phone]

## 2015-04-15 LAB — BASIC METABOLIC PANEL
BUN: 23 mg/dL — AB (ref 4–21)
CREATININE: 0.9 mg/dL (ref 0.5–1.1)
GLUCOSE: 62 mg/dL
POTASSIUM: 3.9 mmol/L (ref 3.4–5.3)
Sodium: 139 mmol/L (ref 137–147)

## 2015-04-15 LAB — HEPATIC FUNCTION PANEL
AST: 12 U/L — AB (ref 13–35)
Alkaline Phosphatase: 100 U/L (ref 25–125)
Bilirubin, Total: 0.3 mg/dL

## 2015-05-19 ENCOUNTER — Encounter: Payer: Self-pay | Admitting: Internal Medicine

## 2015-05-19 ENCOUNTER — Non-Acute Institutional Stay (SKILLED_NURSING_FACILITY): Payer: Medicare Other | Admitting: Internal Medicine

## 2015-05-19 DIAGNOSIS — E890 Postprocedural hypothyroidism: Secondary | ICD-10-CM

## 2015-05-19 DIAGNOSIS — F0393 Unspecified dementia, unspecified severity, with mood disturbance: Secondary | ICD-10-CM

## 2015-05-19 DIAGNOSIS — F0280 Dementia in other diseases classified elsewhere without behavioral disturbance: Secondary | ICD-10-CM

## 2015-05-19 DIAGNOSIS — J300 Vasomotor rhinitis: Secondary | ICD-10-CM

## 2015-05-19 DIAGNOSIS — E46 Unspecified protein-calorie malnutrition: Secondary | ICD-10-CM

## 2015-05-19 DIAGNOSIS — D509 Iron deficiency anemia, unspecified: Secondary | ICD-10-CM | POA: Diagnosis not present

## 2015-05-19 DIAGNOSIS — G2581 Restless legs syndrome: Secondary | ICD-10-CM

## 2015-05-19 DIAGNOSIS — F329 Major depressive disorder, single episode, unspecified: Secondary | ICD-10-CM | POA: Diagnosis not present

## 2015-05-19 DIAGNOSIS — I10 Essential (primary) hypertension: Secondary | ICD-10-CM

## 2015-05-19 NOTE — Progress Notes (Signed)
Patient ID: [name] [name], female   DOB: [birth year], 81 y.o.   MRN: [id number]     [facility]  PCP: Leeanne Rio, PA-C  Code Status: DNR  Allergies  Allergen Reactions  . Codeine Other (See Comments)    insomnia  . Doxycycline Diarrhea and Other (See Comments)    Hallucinations  . Fentanyl Hives and Rash  . Lisinopril Nausea And Vomiting, Rash and Hypertension    Chief Complaint  Patient presents with  . Medical Management of Chronic Issues    Routine Visit      HPI:  81 year old patient is seen for routine visit. She is sitting on her wheelchair combing her hair. She is pleasantly confused but can express her needs. She feeds herself, able to roll herself up some on wheelchair. No falls have been reported. No new skin concerns. She has PMH of dementia, macular degeneration (legally blind), OA, depression, GERD, HTN, anemia among others. No change in behaviors or functional status reported. No concerns from staff.  She has been coughing during the conversation and sounds congested in her chest. As per staff, this is new symptom noticed today  Review of Systems:  Unable to obtain    Past Medical History  Diagnosis Date  . Arthritis   . History of chicken pox   . Depression     husband died 2011/09/26  . Glaucoma   . Thyroid disease   . Hypertension   . Hypokalemia   . Renal disorder   . Altered mental state   . Blind   . Macular degeneration   . Abrasion of arm, left 02/04/2013  . Compression fracture of T12 vertebra 03/21/2013  . Debility 03/21/2013  . Nasal congestion 03/21/2013  . Vasomotor rhinitis 03/21/2013  . Abnormal LFTs 03/21/2013  . Dental abscess 06/06/2013  . Thrush 08/02/2013  . Paronychia 10/11/2013  . Osteoarthritis of ankle and foot 01/12/2014  . RLS (restless legs syndrome) 01/12/2014  . Sacral decubitus ulcer, stage III 08/06/2012  . C. difficile diarrhea 09/12/2012   Past Surgical History  Procedure Laterality Date  .  Revision total knee arthroplasty      right knee  . Back surgery    . Cataract extraction, bilateral    . Thyroidectomy    . Abdominal hysterectomy      Medications: Patient's Medications  New Prescriptions   No medications on file  Previous Medications   ATENOLOL (TENORMIN) 25 MG TABLET    Take 25 mg by mouth daily.   AZELASTINE (ASTELIN) 0.1 % NASAL SPRAY    Place 2 sprays into both nostrils daily. Use in each nostril as directed   CETIRIZINE (ZYRTEC) 10 MG TABLET    Take 1 tablet (10 mg total) by mouth daily.   FERROUS SULFATE 325 (65 FE) MG TABLET    Take 325 mg by mouth 2 (two) times daily with a meal.   FOLIC ACID (FOLVITE) 1 MG TABLET    Take 1 mg by mouth daily.   GABAPENTIN (NEURONTIN) 300 MG CAPSULE    Take 300 mg by mouth 2 (two) times daily.    LEVOTHYROXINE (SYNTHROID, LEVOTHROID) 88 MCG TABLET    Take 88 mcg by mouth daily before breakfast.   MAGNESIUM OXIDE (MAG-OX) 400 MG TABLET    Take 400 mg by mouth daily.   PILOCARPINE (SALAGEN) 5 MG TABLET    Take 5 mg by mouth 3 (three) times daily.   POLYVINYL ALCOHOL (LIQUIFILM TEARS) 1.4 %  OPHTHALMIC SOLUTION    Place 1 drop into both eyes 2 (two) times daily. Wait 3-5 minutes between eye drops.   ROPINIROLE (REQUIP) 1 MG TABLET    Take 0.5-2 mg by mouth 3 (three) times daily. 0.5mg  twice daily at 6am and 12noon with 2mg  daily at bedtime   SACCHAROMYCES BOULARDII (FLORASTOR) 250 MG CAPSULE    Take 250 mg by mouth 2 (two) times daily.   SERTRALINE (ZOLOFT) 100 MG TABLET    Take 100 mg by mouth daily.  Modified Medications   No medications on file  Discontinued Medications   ATENOLOL (TENORMIN) 50 MG TABLET    Take 0.5 tablets (25 mg total) by mouth daily.     Physical Exam: Filed Vitals:   05/19/15 1025  BP: 115/68  Pulse: 80  Temp: 97.3 F (36.3 C)  TempSrc: Oral  Resp: 20  Height: 5\' 2"  (1.575 m)  Weight: 117 lb 3.2 oz (53.162 kg)  SpO2: 96%   Wt Readings from Last 3 Encounters:  05/19/15 117 lb 3.2 oz (53.162  kg)  04/13/15 121 lb 9.6 oz (55.157 kg)  03/18/15 121 lb 9.6 oz (55.157 kg)   General- elderly female, frail but in no acute distress Head- normocephalic, atraumatic Throat- moist mucus membrane Neck- no cervical lymphadenopathy Cardiovascular- normal s1,s2, no murmurs, palpable dorsalis pedis and radial pulses, 1+ leg edema Respiratory- bilateral clear to auscultation, no wheeze, no rhonchi, no crackles, no use of accessory muscles Abdomen- bowel sounds present, soft, non tender Musculoskeletal- generalized weakness, marked kyphosis, on wheelchair Neurological- alert Skin- warm and dry, skin tear to right leg, both legs wrapped in kerlix wrap Psychiatry- normal mood and affect    Labs reviewed: Basic Metabolic Panel:  Recent Labs  07/02/14 08/27/14 1555 11/18/14  NA 141 138 141  K 3.9 4.5 4.1  CL  --  105  --   CO2  --  22  --   GLUCOSE  --  91  --   BUN 18 14 14   CREATININE 1.0 1.07 1.1  CALCIUM  --  9.0  --    Liver Function Tests:  Recent Labs  06/28/14 07/02/14  AST 9* 10*  ALT 6* 7  ALKPHOS 126* 149*   No results for input(s): LIPASE, AMYLASE in the last 8760 hours. No results for input(s): AMMONIA in the last 8760 hours. CBC:  Recent Labs  08/27/14 1555 11/18/14 03/21/15  WBC 5.7 6.0 4.7  NEUTROABS 3.5  --   --   HGB 12.6 11.4* 10.9*  HCT 35.8* 34* 32*  MCV 92.3  --   --   PLT 223 138* 170     Assessment/Plan  Protein calorie malnutrition Continues to lose weight. Decline anticipated with her dementia. Has low albumin on review, dietary consult and start prostat 30 cc po tid with meals for now. Will need assistance in setting up her tray  Hypothyroidism On synthroid 88 mcg daily. tsh 02/18/15 3.576. Continue current regimen and check level q6 months  vasomotor rhinitis Continue zyrtec 10 mg daily and azelastine spray  Iron deficiency anemia On ferrous sulfate 325 mg bid. Last hb 10.9 in 4/16 and mcv 89.5. Normal iron and ferritin level on  5/16 labs. Change iron to 325 mg daily  RLS Stable, continue requip 1.5 mg am, 1 mg in afternoon and 2 mg pm with gabapentin  Depression due to dementia Stable, on sertraline 100 mg daily, attempt GDR and decrease zoloft to 75 mg daily and reassess  HTN  bp stable, continue atenolol 25 mg daily and monitor bp readings   Labs/tests ordered: cbc in 1 month    Blanchie Serve, MD  [facility] [phone] (Monday-Friday 8 am - 5 pm) [phone] (afterhours)

## 2015-05-23 ENCOUNTER — Non-Acute Institutional Stay (SKILLED_NURSING_FACILITY): Payer: Medicare Other | Admitting: Internal Medicine

## 2015-05-23 ENCOUNTER — Encounter: Payer: Self-pay | Admitting: Internal Medicine

## 2015-05-23 DIAGNOSIS — J209 Acute bronchitis, unspecified: Secondary | ICD-10-CM | POA: Diagnosis not present

## 2015-05-23 DIAGNOSIS — J300 Vasomotor rhinitis: Secondary | ICD-10-CM

## 2015-05-23 NOTE — Progress Notes (Signed)
Patient ID: [name] [name], female   DOB: September 06, 1919, 81 y.o.   MRN: [id number]     [facility]  PCP: Leeanne Rio, PA-C  Code Status: DNR  Allergies  Allergen Reactions  . Codeine Other (See Comments)    insomnia  . Doxycycline Diarrhea and Other (See Comments)    Hallucinations  . Fentanyl Hives and Rash  . Lisinopril Nausea And Vomiting, Rash and Hypertension    Chief Complaint  Patient presents with  . Acute Visit    Cough      HPI:  81 year old patient is seen for acute visit. Her son is present at bedside. Her cough has worsened and she is bringing out phlegm. She also has runny nose and complaints of discomfort in her throat. she has been afebrile. She has PMH of dementia, macular degeneration (legally blind), OA, depression, GERD, HTN, anemia among others. No change in behaviors or functional status reported.   Review of Systems:  Unable to obtain except HPI    Past Medical History  Diagnosis Date  . Arthritis   . History of chicken pox   . Depression     husband died 27-Sep-2011  . Glaucoma   . Thyroid disease   . Hypertension   . Hypokalemia   . Renal disorder   . Altered mental state   . Blind   . Macular degeneration   . Abrasion of arm, left 02/04/2013  . Compression fracture of T12 vertebra 03/21/2013  . Debility 03/21/2013  . Nasal congestion 03/21/2013  . Vasomotor rhinitis 03/21/2013  . Abnormal LFTs 03/21/2013  . Dental abscess 06/06/2013  . Thrush 08/02/2013  . Paronychia 10/11/2013  . Osteoarthritis of ankle and foot 01/12/2014  . RLS (restless legs syndrome) 01/12/2014  . Sacral decubitus ulcer, stage III 08/06/2012  . C. difficile diarrhea 09/12/2012   Past Surgical History  Procedure Laterality Date  . Revision total knee arthroplasty      right knee  . Back surgery    . Cataract extraction, bilateral    . Thyroidectomy    . Abdominal hysterectomy      Medications: Patient's Medications  New Prescriptions   No  medications on file  Previous Medications   ATENOLOL (TENORMIN) 25 MG TABLET    Take 25 mg by mouth daily.   AZELASTINE (ASTELIN) 0.1 % NASAL SPRAY    Place 2 sprays into both nostrils daily. Use in each nostril as directed   CETIRIZINE (ZYRTEC) 10 MG TABLET    Take 1 tablet (10 mg total) by mouth daily.   FERROUS SULFATE 325 (65 FE) MG TABLET    Take 325 mg by mouth daily with breakfast.    FOLIC ACID (FOLVITE) 1 MG TABLET    Take 1 mg by mouth daily.   GABAPENTIN (NEURONTIN) 300 MG CAPSULE    Take 300 mg by mouth 2 (two) times daily.    GUAIFENESIN-DEXTROMETHORPHAN (ROBITUSSIN DM) 100-10 MG/5ML SYRUP    Take 10 mLs by mouth every 8 (eight) hours as needed for cough.   LEVOTHYROXINE (SYNTHROID, LEVOTHROID) 88 MCG TABLET    Take 88 mcg by mouth daily before breakfast.   MAGNESIUM OXIDE (MAG-OX) 400 MG TABLET    Take 400 mg by mouth daily.   PILOCARPINE (SALAGEN) 5 MG TABLET    Take 5 mg by mouth 3 (three) times daily.   POLYVINYL ALCOHOL (LIQUIFILM TEARS) 1.4 % OPHTHALMIC SOLUTION    Place 1 drop into both eyes 2 (  two) times daily. Wait 3-5 minutes between eye drops.   ROPINIROLE (REQUIP) 1 MG TABLET    Take 0.5-2 mg by mouth 3 (three) times daily. 0.5mg  twice daily at 6am and 12noon with 2mg  daily at bedtime   SACCHAROMYCES BOULARDII (FLORASTOR) 250 MG CAPSULE    Take 250 mg by mouth 2 (two) times daily.   SERTRALINE (ZOLOFT) 25 MG TABLET    Take 25 mg by mouth daily. With 50 mg for a total of 75 mg daily for depression   SERTRALINE (ZOLOFT) 50 MG TABLET    Take 50 mg by mouth daily. With 25 mg for a total of 75 mg daily for depression  Modified Medications   No medications on file  Discontinued Medications   SERTRALINE (ZOLOFT) 100 MG TABLET    Take 100 mg by mouth daily.     Physical Exam: Filed Vitals:   05/23/15 1227  BP: 106/56  Pulse: 60  Temp: 98 F (36.7 C)  TempSrc: Oral  Resp: 16  Height: 5\' 2"  (1.575 m)  Weight: 117 lb 3.2 oz (53.162 kg)  SpO2: 96%   Wt Readings from  Last 3 Encounters:  05/23/15 117 lb 3.2 oz (53.162 kg)  05/19/15 117 lb 3.2 oz (53.162 kg)  04/13/15 121 lb 9.6 oz (55.157 kg)   General- elderly female, frail but in no acute distress Head- normocephalic, atraumatic Throat- moist mucus membrane, no oropharyngeal erythema, no exudates noted Neck- no cervical lymphadenopathy Cardiovascular- normal s1,s2, no murmurs, palpable dorsalis pedis and radial pulses, 1+ leg edema Respiratory- bilateral rhonchi present. No wheezing or crackles Abdomen- bowel sounds present, soft, non tender Neurological- alert Skin- warm and dry, skin tear to right leg, both legs wrapped in kerlix wrap Psychiatry- normal mood and affect    Labs reviewed: Basic Metabolic Panel:  Recent Labs  07/02/14 08/27/14 1555 11/18/14  NA 141 138 141  K 3.9 4.5 4.1  CL  --  105  --   CO2  --  22  --   GLUCOSE  --  91  --   BUN 18 14 14   CREATININE 1.0 1.07 1.1  CALCIUM  --  9.0  --    Liver Function Tests:  Recent Labs  06/28/14 07/02/14  AST 9* 10*  ALT 6* 7  ALKPHOS 126* 149*   No results for input(s): LIPASE, AMYLASE in the last 8760 hours. No results for input(s): AMMONIA in the last 8760 hours. CBC:  Recent Labs  08/27/14 1555 11/18/14 03/21/15  WBC 5.7 6.0 4.7  NEUTROABS 3.5  --   --   HGB 12.6 11.4* 10.9*  HCT 35.8* 34* 32*  MCV 92.3  --   --   PLT 223 138* 170     Assessment/Plan  Acute bronchitis Has cough, runny nose and scattered rhonchi on exam. Will get chest xray to rule out infiltrates/ pneumonia. Start azithromycin 500 mg today followed by 250 mg daily for next 4 days. Continue robitussin dm 15 cc tid x 5 days and start albuterol nebulizer tid x 5 days. Reassess. Check vitals once a day. Will provide assistance with feeding for now, strict aspiration precautions  vasomotor rhinitis Continue zyrtec 10 mg daily and azelastine spray  Reviewed care plan with patient, her son and nursing supervisor  Labs/tests ordered: chest  xray   Blanchie Serve, MD  [place] [phone] (Monday-Friday 8 am - 5 pm) [phone] (afterhours)

## 2015-05-25 LAB — CBC AND DIFFERENTIAL
HEMATOCRIT: 31 % — AB (ref 36–46)
Hemoglobin: 10.3 g/dL — AB (ref 12.0–16.0)
PLATELETS: 165 10*3/uL (ref 150–399)
WBC: 4.4 10^3/mL

## 2015-06-02 ENCOUNTER — Non-Acute Institutional Stay (SKILLED_NURSING_FACILITY): Payer: Medicare Other | Admitting: Internal Medicine

## 2015-06-02 DIAGNOSIS — J189 Pneumonia, unspecified organism: Secondary | ICD-10-CM

## 2015-06-02 DIAGNOSIS — Z9189 Other specified personal risk factors, not elsewhere classified: Secondary | ICD-10-CM | POA: Diagnosis not present

## 2015-06-02 NOTE — Progress Notes (Signed)
Patient ID: [name] [name], female   DOB: 1919-12-10, 81 y.o.   MRN: [id number]     [facility]  PCP: Leeanne Rio, PA-C  Code Status: DNR  Allergies  Allergen Reactions  . Codeine Other (See Comments)    insomnia  . Doxycycline Diarrhea and Other (See Comments)    Hallucinations  . Fentanyl Hives and Rash  . Lisinopril Nausea And Vomiting, Rash and Hypertension    Chief Complaint  Patient presents with  . Acute Visit    cough and congestion with abnormal cxr findings     HPI:  81 year old patient is seen for acute visit. She was seen by me last week for acute bronchitis. She was started on azithromycin and has completed it. She continues to have cough with chest congestion. No fever reported. Her chest xray shows left basilar pneumonia. No change in behaviors or functional status reported. Denies runny nose or sore throat. Denies chest pain. Limited hpi and ROS  Review of Systems:  Unable to obtain except HPI    Past Medical History  Diagnosis Date  . Arthritis   . History of chicken pox   . Depression     husband died 09-13-11  . Glaucoma   . Thyroid disease   . Hypertension   . Hypokalemia   . Renal disorder   . Altered mental state   . Blind   . Macular degeneration   . Abrasion of arm, left 02/04/2013  . Compression fracture of T12 vertebra 03/21/2013  . Debility 03/21/2013  . Nasal congestion 03/21/2013  . Vasomotor rhinitis 03/21/2013  . Abnormal LFTs 03/21/2013  . Dental abscess 06/06/2013  . Thrush 08/02/2013  . Paronychia 10/11/2013  . Osteoarthritis of ankle and foot 01/12/2014  . RLS (restless legs syndrome) 01/12/2014  . Sacral decubitus ulcer, stage III 08/06/2012  . C. difficile diarrhea 09/12/2012   Past Surgical History  Procedure Laterality Date  . Revision total knee arthroplasty      right knee  . Back surgery    . Cataract extraction, bilateral    . Thyroidectomy    . Abdominal hysterectomy       Medications: Patient's Medications  New Prescriptions   No medications on file  Previous Medications   ATENOLOL (TENORMIN) 25 MG TABLET    Take 25 mg by mouth daily.   AZELASTINE (ASTELIN) 0.1 % NASAL SPRAY    Place 2 sprays into both nostrils daily. Use in each nostril as directed   CETIRIZINE (ZYRTEC) 10 MG TABLET    Take 1 tablet (10 mg total) by mouth daily.   FERROUS SULFATE 325 (65 FE) MG TABLET    Take 325 mg by mouth daily with breakfast.    FOLIC ACID (FOLVITE) 1 MG TABLET    Take 1 mg by mouth daily.   GABAPENTIN (NEURONTIN) 300 MG CAPSULE    Take 300 mg by mouth 2 (two) times daily.    GUAIFENESIN-DEXTROMETHORPHAN (ROBITUSSIN DM) 100-10 MG/5ML SYRUP    Take 10 mLs by mouth every 8 (eight) hours as needed for cough.   LEVOTHYROXINE (SYNTHROID, LEVOTHROID) 88 MCG TABLET    Take 88 mcg by mouth daily before breakfast.   MAGNESIUM OXIDE (MAG-OX) 400 MG TABLET    Take 400 mg by mouth daily.   PILOCARPINE (SALAGEN) 5 MG TABLET    Take 5 mg by mouth 3 (three) times daily.   POLYVINYL ALCOHOL (LIQUIFILM TEARS) 1.4 % OPHTHALMIC SOLUTION    Place  1 drop into both eyes 2 (two) times daily. Wait 3-5 minutes between eye drops.   ROPINIROLE (REQUIP) 1 MG TABLET    Take 0.5-2 mg by mouth 3 (three) times daily. 0.5mg  twice daily at 6am and 12noon with 2mg  daily at bedtime   SACCHAROMYCES BOULARDII (FLORASTOR) 250 MG CAPSULE    Take 250 mg by mouth 2 (two) times daily.   SERTRALINE (ZOLOFT) 25 MG TABLET    Take 25 mg by mouth daily. With 50 mg for a total of 75 mg daily for depression   SERTRALINE (ZOLOFT) 50 MG TABLET    Take 50 mg by mouth daily. With 25 mg for a total of 75 mg daily for depression  Modified Medications   No medications on file  Discontinued Medications   No medications on file     Physical Exam: Filed Vitals:   06/02/15 1443  BP: 135/79  Pulse: 78  Temp: 98 F (36.7 C)  Resp: 18   Wt Readings from Last 3 Encounters:  05/23/15 117 lb 3.2 oz (53.162 kg)   05/19/15 117 lb 3.2 oz (53.162 kg)  04/13/15 121 lb 9.6 oz (55.157 kg)   General- elderly female, frail but in no acute distress Head- normocephalic, atraumatic Throat- moist mucus membrane Neck- no cervical lymphadenopathy Cardiovascular- normal s1,s2, no murmurs, palpable dorsalis pedis and radial pulses, 1+ leg edema Respiratory- bilateral rhonchi present. Decreased air entry to bases. No wheezing or crackles. No use of accessory muscles. Abdomen- bowel sounds present, soft, non tender Neurological- alert Skin- warm and dry, skin tear to right leg,right leg wrapped in kerlix wrap and left leg has new skin tear. Psychiatry- normal mood and affect    Labs reviewed:CBC:  Recent Labs  08/27/14 1555 11/18/14 03/21/15  WBC 5.7 6.0 4.7  NEUTROABS 3.5  --   --   HGB 12.6 11.4* 10.9*  HCT 35.8* 34* 32*  MCV 92.3  --   --   PLT 223 138* 170   05/25/15 wbc 4.4, hb 10.3, plt 165 05/23/15 left mid- lower lung pneumonia 06/01/15 cxr- left basilar pneumonia with some improvement   Assessment/Plan  pneumonia Pneumonia seen on chest xray. Will start her on avelox 400 mg po daily x 1 week with florastor 250 mg po bid x 2 weeks. Continue mucinex 600 mg bid and add duoneb tid x 1 week. Reassess. Will get SLP consult to assess for aspiration risk. Will provide assistance with feeding for now, strict aspiration precautions  At high risk for aspiration Will get SLP consult, assistance with feed for now   [facility], MD  [place] (Monday-Friday 8 am - 5 pm) [phone] (afterhours)

## 2015-06-03 ENCOUNTER — Ambulatory Visit (INDEPENDENT_AMBULATORY_CARE_PROVIDER_SITE_OTHER): Payer: Medicare Other | Admitting: Physician Assistant

## 2015-06-03 ENCOUNTER — Encounter: Payer: Self-pay | Admitting: Physician Assistant

## 2015-06-03 VITALS — BP 133/55 | HR 58 | Temp 96.9°F | Ht 61.0 in | Wt 110.4 lb

## 2015-06-03 DIAGNOSIS — J189 Pneumonia, unspecified organism: Secondary | ICD-10-CM | POA: Diagnosis not present

## 2015-06-03 NOTE — Progress Notes (Signed)
Pre visit review using our clinic review tool, if applicable. No additional management support is needed unless otherwise documented below in the visit note. 

## 2015-06-03 NOTE — Patient Instructions (Signed)
The Geriatric Specialist has evaluated [name] [name] with repeat imaging revealing Healthcare Acquired Pneumonia. She was started on an antibiotic (Avelox) last night and will continue daily for 1 week.  She was also started on new breathing treatments.  Her vital signs look great today.   Please have her follow-up with Dr. Bubba Camp over the next few days. If not seen, have her follow-up with me next Tuesday.

## 2015-06-03 NOTE — Progress Notes (Signed)
Patient presents to clinic today with family who notes patient has been struggling with cough and chest congestion despite being treated by MD at [facility] for bronchitis.  Patient denies fever, but notes cough and congestion associated with fatigue and mild anorexia.  Denies chest pain.    Past Medical History  Diagnosis Date  . Arthritis   . History of chicken pox   . Depression     husband died 09/11/2011  . Glaucoma   . Thyroid disease   . Hypertension   . Hypokalemia   . Renal disorder   . Altered mental state   . Blind   . Macular degeneration   . Abrasion of arm, left 02/04/2013  . Compression fracture of T12 vertebra 03/21/2013  . Debility 03/21/2013  . Nasal congestion 03/21/2013  . Vasomotor rhinitis 03/21/2013  . Abnormal LFTs 03/21/2013  . Dental abscess 06/06/2013  . Thrush 08/02/2013  . Paronychia 10/11/2013  . Osteoarthritis of ankle and foot 01/12/2014  . RLS (restless legs syndrome) 01/12/2014  . Sacral decubitus ulcer, stage III 08/06/2012  . C. difficile diarrhea 09/12/2012    Current Outpatient Prescriptions on File Prior to Visit  Medication Sig Dispense Refill  . atenolol (TENORMIN) 25 MG tablet Take 25 mg by mouth daily.    Marland Kitchen azelastine (ASTELIN) 0.1 % nasal spray Place 2 sprays into both nostrils daily. Use in each nostril as directed    . cetirizine (ZYRTEC) 10 MG tablet Take 1 tablet (10 mg total) by mouth daily. 30 tablet 0  . ferrous sulfate 325 (65 FE) MG tablet Take 325 mg by mouth daily with breakfast.     . folic acid (FOLVITE) 1 MG tablet Take 1 mg by mouth daily.    Marland Kitchen gabapentin (NEURONTIN) 300 MG capsule Take 300 mg by mouth 2 (two) times daily.     Marland Kitchen guaiFENesin-dextromethorphan (ROBITUSSIN DM) 100-10 MG/5ML syrup Take 10 mLs by mouth every 8 (eight) hours as needed for cough.    . levothyroxine (SYNTHROID, LEVOTHROID) 88 MCG tablet Take 88 mcg by mouth daily before breakfast.    . magnesium oxide (MAG-OX) 400 MG tablet Take 400 mg by mouth daily.    .  pilocarpine (SALAGEN) 5 MG tablet Take 5 mg by mouth 3 (three) times daily.    . polyvinyl alcohol (LIQUIFILM TEARS) 1.4 % ophthalmic solution Place 1 drop into both eyes 2 (two) times daily. Wait 3-5 minutes between eye drops.    Marland Kitchen rOPINIRole (REQUIP) 1 MG tablet Take 0.5-2 mg by mouth 3 (three) times daily. 0.5mg  twice daily at 6am and 12noon with 2mg  daily at bedtime    . saccharomyces boulardii (FLORASTOR) 250 MG capsule Take 250 mg by mouth 2 (two) times daily.     No current facility-administered medications on file prior to visit.    Allergies  Allergen Reactions  . Codeine Other (See Comments)    insomnia  . Doxycycline Diarrhea and Other (See Comments)    Hallucinations  . Fentanyl Hives and Rash  . Lisinopril Nausea And Vomiting, Rash and Hypertension    Family History  Problem Relation Age of Onset  . Other Father     Killed in war  . Other Mother     Deceased, 39  . Breast cancer Daughter   . Hypertension Son   . Hyperlipidemia Son     History   Social History  . Marital Status: Widowed    Spouse Name: N/A  . Number of Children: N/A  .  Years of Education: N/A   Social History Main Topics  . Smoking status: Never Smoker   . Smokeless tobacco: Never Used  . Alcohol Use: No  . Drug Use: No  . Sexual Activity: No   Other Topics Concern  . None   Social History Narrative   She is retired from working in a [employer].   Originally from [place] and immigrated from [place] in 1950   She was living in [place] until 2012 with her husband, until he passed away and then moved here.   Review of Systems - See HPI.  All other ROS are negative.  BP 133/55 mmHg  Pulse 58  Temp(Src) 96.9 F (36.1 C) (Tympanic)  Ht 5\' 1"  (1.549 m)  Wt 110 lb 6.4 oz (50.077 kg)  BMI 20.87 kg/m2  SpO2 96%  Physical Exam  Constitutional: She is well-developed, well-nourished, and in no distress.  HENT:  Head: Normocephalic and atraumatic.  Cardiovascular: Normal rate, regular rhythm,  normal heart sounds and intact distal pulses.   Pulmonary/Chest: Effort normal. She has rales.  Neurological: She is alert.  Skin: Skin is warm and dry. No rash noted.  Psychiatric: Affect normal.  Vitals reviewed.   Recent Results (from the past 2160 hour(s))  CBC and differential     Status: Abnormal   Collection Time: 03/21/15 12:00 AM  Result Value Ref Range   Hemoglobin 10.9 (A) 12.0 - 16.0 g/dL   HCT 32 (A) 36 - 46 %   Platelets 170 150 - 399 K/L   WBC 4.7 10^3/mL    Assessment/Plan: HCAP (healthcare-associated pneumonia) On record review patient was seen by Geriatric MD at her SNF yesterday afteroon where repeat imaging revealed HCAP.  Patient was started on Avelox once daily x 7 days, Florastor BID x 2 weeks and Duoneb treatments TID over the next week.  Unfortunately family was not informed that this occurred.  Repeat exam shows patient stable.  Can hear rales at area of infiltrate on x-ray. Vitals look good.  Encouraged family to continue care recommendations from Geriatric MD.  Will contact home to discuss poor communication to family.  I am sure the MD will want to be aware of this. Follow-up next week.

## 2015-06-03 NOTE — Assessment & Plan Note (Signed)
On record review patient was seen by Geriatric MD at her SNF yesterday afteroon where repeat imaging revealed HCAP.  Patient was started on Avelox once daily x 7 days, Florastor BID x 2 weeks and Duoneb treatments TID over the next week.  Unfortunately family was not informed that this occurred.  Repeat exam shows patient stable.  Can hear rales at area of infiltrate on x-ray. Vitals look good.  Encouraged family to continue care recommendations from Geriatric MD.  Will contact home to discuss poor communication to family.  I am sure the MD will want to be aware of this. Follow-up next week.

## 2015-06-20 ENCOUNTER — Non-Acute Institutional Stay (SKILLED_NURSING_FACILITY): Payer: Medicare Other | Admitting: Nurse Practitioner

## 2015-06-20 ENCOUNTER — Encounter: Payer: Self-pay | Admitting: Nurse Practitioner

## 2015-06-20 DIAGNOSIS — F329 Major depressive disorder, single episode, unspecified: Secondary | ICD-10-CM

## 2015-06-20 DIAGNOSIS — F0393 Unspecified dementia, unspecified severity, with mood disturbance: Secondary | ICD-10-CM

## 2015-06-20 DIAGNOSIS — J189 Pneumonia, unspecified organism: Secondary | ICD-10-CM

## 2015-06-20 DIAGNOSIS — E890 Postprocedural hypothyroidism: Secondary | ICD-10-CM

## 2015-06-20 DIAGNOSIS — E46 Unspecified protein-calorie malnutrition: Secondary | ICD-10-CM

## 2015-06-20 DIAGNOSIS — D509 Iron deficiency anemia, unspecified: Secondary | ICD-10-CM | POA: Diagnosis not present

## 2015-06-20 DIAGNOSIS — I10 Essential (primary) hypertension: Secondary | ICD-10-CM

## 2015-06-20 DIAGNOSIS — F0280 Dementia in other diseases classified elsewhere without behavioral disturbance: Secondary | ICD-10-CM | POA: Diagnosis not present

## 2015-06-20 NOTE — Progress Notes (Signed)
Patient ID: [name] [name], female   DOB: [birth year], 81 y.o.   MRN: [id number]    Nursing Home Location:  [place] of Service: SNF (31)  PCP: Leeanne Rio, PA-C  Allergies  Allergen Reactions  . Codeine Other (See Comments)    insomnia  . Doxycycline Diarrhea and Other (See Comments)    Hallucinations  . Fentanyl Hives and Rash  . Lisinopril Nausea And Vomiting, Rash and Hypertension    Chief Complaint  Patient presents with  . Medical Management of Chronic Issues    Routine Visit     HPI:  Patient is a 81 y.o. female seen today at [facility] for routine follow up on chronic conditions. Pt with a PMH of dementia, macular degeneration (legally blind), OA, depression, GERD, HTN.   Pt has been seen by Dr Bubba Camp multiple times in the last month due to cough and congestion with recurrent pneumonia. Has completed treatment and cough/conmgestion has improved. There has been no changes in cognitive status, mood stable. Blood pressure stable on current regimen.   Review of Systems:  Review of Systems  Unable to perform ROS: Dementia    Past Medical History  Diagnosis Date  . Arthritis   . History of chicken pox   . Depression     husband died 2011-09-29  . Glaucoma   . Thyroid disease   . Hypertension   . Hypokalemia   . Renal disorder   . Altered mental state   . Blind   . Macular degeneration   . Abrasion of arm, left 02/04/2013  . Compression fracture of T12 vertebra 03/21/2013  . Debility 03/21/2013  . Nasal congestion 03/21/2013  . Vasomotor rhinitis 03/21/2013  . Abnormal LFTs 03/21/2013  . Dental abscess 06/06/2013  . Thrush 08/02/2013  . Paronychia 10/11/2013  . Osteoarthritis of ankle and foot 01/12/2014  . RLS (restless legs syndrome) 01/12/2014  . Sacral decubitus ulcer, stage III 08/06/2012  . C. difficile diarrhea 09/12/2012   Past Surgical History  Procedure Laterality Date  . Revision total knee arthroplasty       right knee  . Back surgery    . Cataract extraction, bilateral    . Thyroidectomy    . Abdominal hysterectomy     Social History:   reports that she has never smoked. She has never used smokeless tobacco. She reports that she does not drink alcohol or use illicit drugs.  Family History  Problem Relation Age of Onset  . Other Father     Killed in war  . Other Mother     Deceased, 45  . Breast cancer Daughter   . Hypertension Son   . Hyperlipidemia Son     Medications: Patient's Medications  New Prescriptions   No medications on file  Previous Medications   AMBULATORY NON FORMULARY MEDICATION    Medication Name: Med pass 120 mL by mouth twice daily   AMINO ACIDS-PROTEIN HYDROLYS (FEEDING SUPPLEMENT, PRO-STAT SUGAR FREE 64,) LIQD    Take 30 mLs by mouth 3 (three) times daily with meals. For decreased albumin   ATENOLOL (TENORMIN) 25 MG TABLET    Take 25 mg by mouth daily.   AZELASTINE (ASTELIN) 0.1 % NASAL SPRAY    Place 2 sprays into both nostrils daily. Use in each nostril as directed   CETIRIZINE (ZYRTEC) 10 MG TABLET    Take 1 tablet (10 mg total) by mouth daily.   FERROUS SULFATE  325 (65 FE) MG TABLET    Take 325 mg by mouth daily with breakfast.    FOLIC ACID (FOLVITE) 1 MG TABLET    Take 1 mg by mouth daily.   GABAPENTIN (NEURONTIN) 300 MG CAPSULE    Take 300 mg by mouth 2 (two) times daily.    LEVOTHYROXINE (SYNTHROID, LEVOTHROID) 88 MCG TABLET    Take 88 mcg by mouth daily before breakfast.   MAGNESIUM OXIDE (MAG-OX) 400 MG TABLET    Take 400 mg by mouth daily.   PILOCARPINE (SALAGEN) 5 MG TABLET    Take 5 mg by mouth 3 (three) times daily.   POLYVINYL ALCOHOL (LIQUIFILM TEARS) 1.4 % OPHTHALMIC SOLUTION    Place 1 drop into both eyes 2 (two) times daily. Wait 3-5 minutes between eye drops.   ROPINIROLE (REQUIP) 1 MG TABLET    0.5mg  twice daily at 6am and 12 noon, take 1 by mouth at 6 pm and  2mg  daily at bedtime   SACCHAROMYCES BOULARDII (FLORASTOR) 250 MG CAPSULE     Take 250 mg by mouth 2 (two) times daily.   SERTRALINE (ZOLOFT) 25 MG TABLET    Take 3 tablets=75mg  by mouth daily for depression due to Dementia.  Modified Medications   No medications on file  Discontinued Medications   GUAIFENESIN-DEXTROMETHORPHAN (ROBITUSSIN DM) 100-10 MG/5ML SYRUP    Take 10 mLs by mouth every 8 (eight) hours as needed for cough.   NYSTATIN (MYCOSTATIN/NYSTOP) 100000 UNIT/GM POWD    Apply a small amount to affected area topically twice a day as needed.     Physical Exam: Filed Vitals:   06/20/15 1528  BP: 110/60  Pulse: 64  Temp: 98 F (36.7 C)  TempSrc: Oral  Resp: 18  Height: 5\' 2"  (1.575 m)  Weight: 117 lb (53.071 kg)  SpO2: 96%    Physical Exam  Constitutional: She is oriented to person, place, and time. No distress.  Frail elderly female with stooped posture, in WC  HENT:  Head: Normocephalic and atraumatic.  Mouth/Throat: Oropharynx is clear and moist. No oropharyngeal exudate.  Eyes: Conjunctivae are normal. Pupils are equal, round, and reactive to light.  Neck: Normal range of motion. Neck supple.  Cardiovascular: Normal rate, regular rhythm and normal heart sounds.   Pulmonary/Chest: Effort normal and breath sounds normal.  Abdominal: Soft. Bowel sounds are normal.  Musculoskeletal: She exhibits edema (1+ bilaterally to LE). She exhibits no tenderness.  Kyphosis   Neurological: She is alert and oriented to person, place, and time.  Skin: Skin is warm and dry. She is not diaphoretic.  Psychiatric: She has a normal mood and affect.    Labs reviewed: Basic Metabolic Panel:  Recent Labs  08/27/14 1555 11/18/14 04/15/15  NA 138 141 139  K 4.5 4.1 3.9  CL 105  --   --   CO2 22  --   --   GLUCOSE 91  --   --   BUN 14 14 23*  CREATININE 1.07 1.1 0.9  CALCIUM 9.0  --   --    Liver Function Tests:  Recent Labs  06/28/14 07/02/14 04/15/15  AST 9* 10* 12*  ALT 6* 7  --   ALKPHOS 126* 149* 100   No results for input(s): LIPASE,  AMYLASE in the last 8760 hours. No results for input(s): AMMONIA in the last 8760 hours. CBC:  Recent Labs  08/27/14 1555 11/18/14 03/21/15 05/25/15  WBC 5.7 6.0 4.7 4.4  NEUTROABS 3.5  --   --   --  HGB 12.6 11.4* 10.9* 10.3*  HCT 35.8* 34* 32* 31*  MCV 92.3  --   --   --   PLT 223 138* 170 165   TSH:  Recent Labs  07/02/14 08/27/14 1555 02/18/15  TSH 3.86 2.239 3.58   A1C: No results found for: HGBA1C Lipid Panel:  Recent Labs  07/02/14  LDLCALC 156  TRIG 97     Assessment/Plan 1. Anemia, iron deficiency -stable, conts on iron daily   2. Essential hypertension Blood pressure controlled, conts on atenolol   3. Postoperative hypothyroidism TSH stable in march, conts on synthroid 88 mcg  4. HCAP (healthcare-associated pneumonia) -has been treated with azithromycin and avelox, aspiration precautions in place, cough and congestion have improved at this time   5. Depression due to dementia Stable on zoloft   6. Protein-calorie malnutrition Weight at 117 lbs which is up from previous, being followed by RD and continues on supplements    Jessica K. Harle Battiest  [facility] [phone] 8 am - 5 pm) [phone] (after hours)

## 2015-08-29 ENCOUNTER — Ambulatory Visit: Payer: Medicare Other | Admitting: Physician Assistant

## 2016-05-13 ENCOUNTER — Emergency Department (HOSPITAL_COMMUNITY): Payer: Medicare Other

## 2016-05-13 ENCOUNTER — Emergency Department (HOSPITAL_COMMUNITY)
Admission: EM | Admit: 2016-05-13 | Discharge: 2016-05-14 | Disposition: A | Discharge status: Skilled Nursing Facility | Payer: Medicare Other | Attending: Emergency Medicine | Admitting: Emergency Medicine

## 2016-05-13 ENCOUNTER — Encounter (HOSPITAL_COMMUNITY): Payer: Self-pay | Admitting: Nurse Practitioner

## 2016-05-13 DIAGNOSIS — I509 Heart failure, unspecified: Secondary | ICD-10-CM | POA: Diagnosis not present

## 2016-05-13 DIAGNOSIS — I110 Hypertensive heart disease with heart failure: Secondary | ICD-10-CM | POA: Diagnosis not present

## 2016-05-13 DIAGNOSIS — Z79899 Other long term (current) drug therapy: Secondary | ICD-10-CM | POA: Insufficient documentation

## 2016-05-13 DIAGNOSIS — R0902 Hypoxemia: Secondary | ICD-10-CM | POA: Diagnosis present

## 2016-05-13 LAB — BRAIN NATRIURETIC PEPTIDE: B Natriuretic Peptide: 447.2 pg/mL — ABNORMAL HIGH (ref 0.0–100.0)

## 2016-05-13 LAB — URINALYSIS, ROUTINE W REFLEX MICROSCOPIC
GLUCOSE, UA: NEGATIVE mg/dL
Hgb urine dipstick: NEGATIVE
KETONES UR: NEGATIVE mg/dL
LEUKOCYTES UA: NEGATIVE
NITRITE: NEGATIVE
PH: 6 (ref 5.0–8.0)
PROTEIN: NEGATIVE mg/dL
Specific Gravity, Urine: 1.025 (ref 1.005–1.030)

## 2016-05-13 LAB — BASIC METABOLIC PANEL
ANION GAP: 8 (ref 5–15)
BUN: 16 mg/dL (ref 6–20)
CALCIUM: 8.6 mg/dL — AB (ref 8.9–10.3)
CO2: 25 mmol/L (ref 22–32)
CREATININE: 0.91 mg/dL (ref 0.44–1.00)
Chloride: 106 mmol/L (ref 101–111)
GFR, EST NON AFRICAN AMERICAN: 52 mL/min — AB (ref >60)
GLUCOSE: 107 mg/dL — AB (ref 65–99)
Potassium: 4.2 mmol/L (ref 3.5–5.1)
Sodium: 139 mmol/L (ref 135–145)

## 2016-05-13 LAB — CBC WITH DIFFERENTIAL/PLATELET
BASOS ABS: 0 10*3/uL (ref 0.0–0.1)
BASOS PCT: 0 %
EOS ABS: 0 10*3/uL (ref 0.0–0.7)
EOS PCT: 0 %
HEMATOCRIT: 37.8 % (ref 36.0–46.0)
Hemoglobin: 12.5 g/dL (ref 12.0–15.0)
Lymphocytes Relative: 7 %
Lymphs Abs: 0.9 10*3/uL (ref 0.7–4.0)
MCH: 29.6 pg (ref 26.0–34.0)
MCHC: 33.1 g/dL (ref 30.0–36.0)
MCV: 89.6 fL (ref 78.0–100.0)
MONO ABS: 0.7 10*3/uL (ref 0.1–1.0)
MONOS PCT: 6 %
NEUTROS ABS: 10.8 10*3/uL — AB (ref 1.7–7.7)
Neutrophils Relative %: 87 %
PLATELETS: 242 10*3/uL (ref 150–400)
RBC: 4.22 MIL/uL (ref 3.87–5.11)
RDW: 14.6 % (ref 11.5–15.5)
WBC: 12.5 10*3/uL — ABNORMAL HIGH (ref 4.0–10.5)

## 2016-05-13 LAB — I-STAT TROPONIN, ED: Troponin i, poc: 0 ng/mL (ref 0.00–0.08)

## 2016-05-13 MED ORDER — FUROSEMIDE 10 MG/ML IJ SOLN
40.0000 mg | Freq: Once | INTRAMUSCULAR | Status: DC
Start: 2016-05-13 — End: 2016-05-13

## 2016-05-13 MED ORDER — FUROSEMIDE 40 MG PO TABS
40.0000 mg | ORAL_TABLET | Freq: Once | ORAL | Status: AC
  Administered 2016-05-13: 40 mg via ORAL
  Filled 2016-05-13: qty 1

## 2016-05-13 NOTE — ED Provider Notes (Signed)
CSN: [id number]     Arrival date & time 05/13/16  1843 History   First MD Initiated Contact with Patient 05/13/16 1904     Chief Complaint  Patient presents with  . Low O2 Sats    Level V caveat secondary to dementia  HPI  [name] [name] is a 82 year old female presenting from her nursing facility for evaluation of hypoxia. Pt's daughter is at bedside and provides the history. Pt has been treated for an URI over the past week with an unknown antibiotic. The daughter received a call from the nursing facility today that the patient's O2 was dropping and her cough was worsening. They requested transport to ED for further evaluation. Daughter states that her mother is at her baseline mentation. They are unaware of any recent physical complaints made by the patient.   Called Elaina Pattee, Therapist, sports at [employer]. She states that she received sign out from [name] [name] nurse at shift change. Pt has been treated for URI with levaquin for the past week. She has been intermittently using oxygen at night as needed with oxygen saturations in the 80s intermittently. After her physical therapy session today, she dropped her O2 into the 70s and became blue in the lips. They administered multiple breathing treatments at the facility which did not improve her oxygen. They then called the daughter who requested transport to the ED because "she didn't want her to expire at our facility".   Past Medical History  Diagnosis Date  . Arthritis   . History of chicken pox   . Depression     husband died 08-28-2011  . Glaucoma   . Thyroid disease   . Hypertension   . Hypokalemia   . Renal disorder   . Altered mental state   . Blind   . Macular degeneration   . Abrasion of arm, left 02/04/2013  . Compression fracture of T12 vertebra ([place]) 03/21/2013  . Debility 03/21/2013  . Nasal congestion 03/21/2013  . Vasomotor rhinitis 03/21/2013  . Abnormal LFTs 03/21/2013  . Dental abscess 06/06/2013  . Thrush  08/02/2013  . Paronychia 10/11/2013  . Osteoarthritis of ankle and foot 01/12/2014  . RLS (restless legs syndrome) 01/12/2014  . Sacral decubitus ulcer, stage III ([place]) 08/06/2012  . C. difficile diarrhea 09/12/2012   Past Surgical History  Procedure Laterality Date  . Revision total knee arthroplasty      right knee  . Back surgery    . Cataract extraction, bilateral    . Thyroidectomy    . Abdominal hysterectomy     Family History  Problem Relation Age of Onset  . Other Father     Killed in war  . Other Mother     Deceased, 42  . Breast cancer Daughter   . Hypertension Son   . Hyperlipidemia Son    Social History  Substance Use Topics  . Smoking status: Never Smoker   . Smokeless tobacco: Never Used  . Alcohol Use: No   OB History    No data available     Review of Systems  Unable to perform ROS: Dementia      Allergies  Codeine; Doxycycline; Fentanyl; and Lisinopril  Home Medications   Prior to Admission medications   Medication Sig Start Date End Date Taking? Authorizing Provider  AMBULATORY NON FORMULARY MEDICATION Medication Name: Med pass 120 mL by mouth three times daily   Yes Historical Provider, MD  atenolol (TENORMIN) 25 MG tablet Take 25 mg  by mouth daily.   Yes Historical Provider, MD  azelastine (ASTELIN) 0.1 % nasal spray Place 2 sprays into both nostrils 2 (two) times daily. Use in each nostril as directed   Yes Historical Provider, MD  cetirizine (ZYRTEC) 10 MG tablet Take 1 tablet (10 mg total) by mouth daily. 04/27/13  Yes Mosie Lukes, MD  ferrous sulfate 325 (65 FE) MG tablet Take 325 mg by mouth daily with breakfast.    Yes Historical Provider, MD  folic acid (FOLVITE) 1 MG tablet Take 1 mg by mouth daily.   Yes Historical Provider, MD  gabapentin (NEURONTIN) 300 MG capsule Take 300 mg by mouth 2 (two) times daily.    Yes Historical Provider, MD  levofloxacin (LEVAQUIN) 500 MG tablet Take 500 mg by mouth daily.   Yes Historical Provider, MD   levothyroxine (SYNTHROID, LEVOTHROID) 100 MCG tablet Take 100 mcg by mouth daily before breakfast.   Yes Historical Provider, MD  magnesium oxide (MAG-OX) 400 MG tablet Take 400 mg by mouth daily.   Yes Historical Provider, MD  pilocarpine (SALAGEN) 5 MG tablet Take 5 mg by mouth 3 (three) times daily.   Yes Historical Provider, MD  polyvinyl alcohol (LIQUIFILM TEARS) 1.4 % ophthalmic solution Place 1 drop into both eyes 2 (two) times daily. Wait 3-5 minutes between eye drops.   Yes Historical Provider, MD  rOPINIRole (REQUIP) 0.5 MG tablet Take 0.5-2 mg by mouth 3 (three) times daily. Take 0.5 mgs at 6am and 12pm, take 1mg  at 6pm, and take 2mg s at 10pm   Yes Historical Provider, MD  saccharomyces boulardii (FLORASTOR) 250 MG capsule Take 250 mg by mouth 2 (two) times daily.   Yes Historical Provider, MD  sertraline (ZOLOFT) 50 MG tablet Take 50 mg by mouth daily.   Yes Historical Provider, MD  Vitamin D, Ergocalciferol, (DRISDOL) 50000 units CAPS capsule Take 50,000 Units by mouth every 7 (seven) days. mondays   Yes Historical Provider, MD   BP 119/80 mmHg  Pulse 115  Temp(Src) 97.5 F (36.4 C) (Oral)  Resp 15  SpO2 96% Physical Exam  Constitutional: She appears well-developed and well-nourished. No distress.  Pt sleeping and occasionally mumbling. Wakes to verbal stimuli. Frail.   HENT:  Head: Normocephalic and atraumatic.  Eyes: Conjunctivae are normal. Right eye exhibits no discharge. Left eye exhibits no discharge. No scleral icterus.  Neck: Normal range of motion. JVD present.  Cardiovascular: Regular rhythm and normal heart sounds.  Tachycardia present.   Pulmonary/Chest: Effort normal. No respiratory distress. She has decreased breath sounds. She has no wheezes. She has no rhonchi. She has no rales.  Abdominal: Soft. She exhibits no distension. There is no tenderness.  Musculoskeletal: Normal range of motion.  Bilateral peripheral pitting edema  Neurological: She is alert.  Coordination normal.  No facial droop or speech slur. Pt is demented and does not follow commands for formal neuro testing. PERRL.   Skin: Skin is warm and dry.  Psychiatric: She has a normal mood and affect. Her behavior is normal.  Nursing note and vitals reviewed.   ED Course  Procedures (including critical care time) Labs Review Labs Reviewed  CBC WITH DIFFERENTIAL/PLATELET - Abnormal; Notable for the following:    WBC 12.5 (*)    Neutro Abs 10.8 (*)    All other components within normal limits  BASIC METABOLIC PANEL - Abnormal; Notable for the following:    Glucose, Bld 107 (*)    Calcium 8.6 (*)    GFR  calc non Af Amer 52 (*)    All other components within normal limits  URINALYSIS, ROUTINE W REFLEX MICROSCOPIC (NOT AT [facility]) - Abnormal; Notable for the following:    Color, Urine AMBER (*)    APPearance HAZY (*)    Bilirubin Urine SMALL (*)    All other components within normal limits  BRAIN NATRIURETIC PEPTIDE - Abnormal; Notable for the following:    B Natriuretic Peptide 447.2 (*)    All other components within normal limits  I-STAT TROPOININ, ED    Imaging Review Dg Chest 2 View  05/13/2016  CLINICAL DATA:  Recent pneumonia with hypoxemia and productive cough. EXAM: CHEST  2 VIEW COMPARISON:  Radiographs 06/29/2013.  CT 02/19/2014. FINDINGS: Patient has a chronic kyphosis. The mandible overlies the right lung apex. There is stable cardiomegaly and aortic atherosclerosis. There are new bilateral pleural effusions with fluid tracking into the fissures on the right. There are associated bibasilar airspace opacities without consolidation. Splenic artery aneurysm and lower thoracic compression deformity are grossly stable. IMPRESSION: New bilateral pleural effusions with bibasilar atelectasis or infiltrates. Stable cardiomegaly. Electronically Signed   By: Richardean Sale M.D.   On: 05/13/2016 20:52   I have personally reviewed and evaluated these images and lab results as part  of my medical decision-making.   EKG Interpretation None      MDM   Final diagnoses:  Congestive heart failure, unspecified congestive heart failure chronicity, unspecified congestive heart failure type [facility])   82 year old female presenting from a nursing facility with acute hypoxia. Patient currently being treated with Levaquin for an upper respiratory infection. Afebrile. Mildly tachycardic. Hemodynamically stable. Patient initially on 2 L nasal cannula with 100% O2. Removed supplemental oxygen and patient maintained her oxygen above 97% for the remainder of the emergency department stay. Patient coughing frequently with diminished breath sounds bilaterally. JVD present. Bilateral peripheral pitting edema. Mild leukocytosis. Troponin negative. BNP elevated to 450. Chart review shows a BNP drawn in 2012 which was less than 100. Chest x-ray shows new bilateral pleural effusions with by basilar atelectasis versus infiltrates. Long discussion with family at bedside about patient's DO NOT RESUSCITATE status and desire to be hospitalized. Patient's family wish to keep the patient out of the hospital and return her to the nursing home. Discussed with [facility] who follows her primary care. They recommended 1 dose of Lasix in emergency department and discharged back to nursing facility and Dr. Forde Dandy will see her tomorrow. Discussed plan with family at bedside who agree. Pt to be discharged back to nursing facility.       Josephina Gip, PA-C 05/14/16 1234  Orlie Dakin, MD 05/15/16 1505

## 2016-05-13 NOTE — ED Notes (Signed)
Pt's family have declined IV lasix, they are requesting that pt be discharged.

## 2016-05-13 NOTE — ED Provider Notes (Signed)
Level V caveat dementia. History is obtained from her family members who accompanied her and from records accompanied patient. Noted to have low oxygen saturations earlier today. Patient has had cough for the past several days. On exam patient is chronically ill-appearing no respiratory distress HEENT exam no facial asymmetry neck JVD present lungs diminished breath sounds. No respiratory distress abdomen nondistended bilateral lower extremities with 1+ pretibial pitting edema  Orlie Dakin, MD 05/13/16 2111

## 2016-05-13 NOTE — ED Notes (Signed)
Pt is presented from [facility] facility for evaluation of low O2 Sats, recently treated PNA, reportedly the breathing treatment at facility were unhelpful.

## 2016-05-13 NOTE — ED Notes (Signed)
Patient transported to X-ray 

## 2016-05-13 NOTE — Discharge Instructions (Signed)
Dr. Forde Dandy will follow up with you tomorrow to get [name] [name] started on Lasix. Continue the Levaquin as she has been prescribed.   Heart Failure Heart failure is a condition in which the heart has trouble pumping blood. This means your heart does not pump blood efficiently for your body to work well. In some cases of heart failure, fluid may back up into your lungs or you may have swelling (edema) in your lower legs. Heart failure is usually a long-term (chronic) condition. It is important for you to take good care of yourself and follow your health care provider's treatment plan. CAUSES  Some health conditions can cause heart failure. Those health conditions include:  High blood pressure (hypertension). Hypertension causes the heart muscle to work harder than normal. When pressure in the blood vessels is high, the heart needs to pump (contract) with more force in order to circulate blood throughout the body. High blood pressure eventually causes the heart to become stiff and weak.  Coronary artery disease (CAD). CAD is the buildup of cholesterol and fat (plaque) in the arteries of the heart. The blockage in the arteries deprives the heart muscle of oxygen and blood. This can cause chest pain and may lead to a heart attack. High blood pressure can also contribute to CAD.  Heart attack (myocardial infarction). A heart attack occurs when one or more arteries in the heart become blocked. The loss of oxygen damages the muscle tissue of the heart. When this happens, part of the heart muscle dies. The injured tissue does not contract as well and weakens the heart's ability to pump blood.  Abnormal heart valves. When the heart valves do not open and close properly, it can cause heart failure. This makes the heart muscle pump harder to keep the blood flowing.  Heart muscle disease (cardiomyopathy or myocarditis). Heart muscle disease is damage to the heart muscle from a variety of causes. These can include drug  or alcohol abuse, infections, or unknown reasons. These can increase the risk of heart failure.  Lung disease. Lung disease makes the heart work harder because the lungs do not work properly. This can cause a strain on the heart, leading it to fail.  Diabetes. Diabetes increases the risk of heart failure. High blood sugar contributes to high fat (lipid) levels in the blood. Diabetes can also cause slow damage to tiny blood vessels that carry important nutrients to the heart muscle. When the heart does not get enough oxygen and food, it can cause the heart to become weak and stiff. This leads to a heart that does not contract efficiently.  Other conditions can contribute to heart failure. These include abnormal heart rhythms, thyroid problems, and low blood counts (anemia). Certain unhealthy behaviors can increase the risk of heart failure, including:  Being overweight.  Smoking or chewing tobacco.  Eating foods high in fat and cholesterol.  Abusing illicit drugs or alcohol.  Lacking physical activity. SYMPTOMS  Heart failure symptoms may vary and can be hard to detect. Symptoms may include:  Shortness of breath with activity, such as climbing stairs.  Persistent cough.  Swelling of the feet, ankles, legs, or abdomen.  Unexplained weight gain.  Difficulty breathing when lying flat (orthopnea).  Waking from sleep because of the need to sit up and get more air.  Rapid heartbeat.  Fatigue and loss of energy.  Feeling light-headed, dizzy, or close to fainting.  Loss of appetite.  Nausea.  Increased urination during the night (nocturia). DIAGNOSIS  A diagnosis of heart failure is based on your history, symptoms, physical examination, and diagnostic tests. Diagnostic tests for heart failure may include:  Echocardiography.  Electrocardiography.  Chest X-ray.  Blood tests.  Exercise stress test.  Cardiac angiography.  Radionuclide scans. TREATMENT  Treatment is  aimed at managing the symptoms of heart failure. Medicines, behavioral changes, or surgical intervention may be necessary to treat heart failure.  Medicines to help treat heart failure may include:  Angiotensin-converting enzyme (ACE) inhibitors. This type of medicine blocks the effects of a blood protein called angiotensin-converting enzyme. ACE inhibitors relax (dilate) the blood vessels and help lower blood pressure.  Angiotensin receptor blockers (ARBs). This type of medicine blocks the actions of a blood protein called angiotensin. Angiotensin receptor blockers dilate the blood vessels and help lower blood pressure.  Water pills (diuretics). Diuretics cause the kidneys to remove salt and water from the blood. The extra fluid is removed through urination. This loss of extra fluid lowers the volume of blood the heart pumps.  Beta blockers. These prevent the heart from beating too fast and improve heart muscle strength.  Digitalis. This increases the force of the heartbeat.  Healthy behavior changes include:  Obtaining and maintaining a healthy weight.  Stopping smoking or chewing tobacco.  Eating heart-healthy foods.  Limiting or avoiding alcohol.  Stopping illicit drug use.  Physical activity as directed by your health care provider.  Surgical treatment for heart failure may include:  A procedure to open blocked arteries, repair damaged heart valves, or remove damaged heart muscle tissue.  A pacemaker to improve heart muscle function and control certain abnormal heart rhythms.  An internal cardioverter defibrillator to treat certain serious abnormal heart rhythms.  A left ventricular assist device (LVAD) to assist the pumping ability of the heart. HOME CARE INSTRUCTIONS   Take medicines only as directed by your health care provider. Medicines are important in reducing the workload of your heart, slowing the progression of heart failure, and improving your symptoms.  Do  not stop taking your medicine unless directed by your health care provider.  Do not skip any dose of medicine.  Refill your prescriptions before you run out of medicine. Your medicines are needed every day.  Engage in moderate physical activity if directed by your health care provider. Moderate physical activity can benefit some people. The elderly and people with severe heart failure should consult with a health care provider for physical activity recommendations.  Eat heart-healthy foods. Food choices should be free of trans fat and low in saturated fat, cholesterol, and salt (sodium). Healthy choices include fresh or frozen fruits and vegetables, fish, lean meats, legumes, fat-free or low-fat dairy products, and whole grain or high fiber foods. Talk to a dietitian to learn more about heart-healthy foods.  Limit sodium if directed by your health care provider. Sodium restriction may reduce symptoms of heart failure in some people. Talk to a dietitian to learn more about heart-healthy seasonings.  Use healthy cooking methods. Healthy cooking methods include roasting, grilling, broiling, baking, poaching, steaming, or stir-frying. Talk to a dietitian to learn more about healthy cooking methods.  Limit fluids if directed by your health care provider. Fluid restriction may reduce symptoms of heart failure in some people.  Weigh yourself every day. Daily weights are important in the early recognition of excess fluid. You should weigh yourself every morning after you urinate and before you eat breakfast. Wear the same amount of clothing each time you weigh yourself. Record your  daily weight. Provide your health care provider with your weight record.  Monitor and record your blood pressure if directed by your health care provider.  Check your pulse if directed by your health care provider.  Lose weight if directed by your health care provider. Weight loss may reduce symptoms of heart failure in some  people.  Stop smoking or chewing tobacco. Nicotine makes your heart work harder by causing your blood vessels to constrict. Do not use nicotine gum or patches before talking to your health care provider.  Keep all follow-up visits as directed by your health care provider. This is important.  Limit alcohol intake to no more than 1 drink per day for nonpregnant women and 2 drinks per day for men. One drink equals 12 ounces of beer, 5 ounces of wine, or 1 ounces of hard liquor. Drinking more than that is harmful to your heart. Tell your health care provider if you drink alcohol several times a week. Talk with your health care provider about whether alcohol is safe for you. If your heart has already been damaged by alcohol or you have severe heart failure, drinking alcohol should be stopped completely.  Stop illicit drug use.  Stay up-to-date with immunizations. It is especially important to prevent respiratory infections through current pneumococcal and influenza immunizations.  Manage other health conditions such as hypertension, diabetes, thyroid disease, or abnormal heart rhythms as directed by your health care provider.  Learn to manage stress.  Plan rest periods when fatigued.  Learn strategies to manage high temperatures. If the weather is extremely hot:  Avoid vigorous physical activity.  Use air conditioning or fans or seek a cooler location.  Avoid caffeine and alcohol.  Wear loose-fitting, lightweight, and light-colored clothing.  Learn strategies to manage cold temperatures. If the weather is extremely cold:  Avoid vigorous physical activity.  Layer clothes.  Wear mittens or gloves, a hat, and a scarf when going outside.  Avoid alcohol.  Obtain ongoing education and support as needed.  Participate in or seek rehabilitation as needed to maintain or improve independence and quality of life. SEEK MEDICAL CARE IF:   You have a rapid weight gain.  You have increasing  shortness of breath that is unusual for you.  You are unable to participate in your usual physical activities.  You tire easily.  You cough more than normal, especially with physical activity.  You have any or more swelling in areas such as your hands, feet, ankles, or abdomen.  You are unable to sleep because it is hard to breathe.  You feel like your heart is beating fast (palpitations).  You become dizzy or light-headed upon standing up. SEEK IMMEDIATE MEDICAL CARE IF:   You have difficulty breathing.  There is a change in mental status such as decreased alertness or difficulty with concentration.  You have a pain or discomfort in your chest.  You have an episode of fainting (syncope). MAKE SURE YOU:   Understand these instructions.  Will watch your condition.  Will get help right away if you are not doing well or get worse.   This information is not intended to replace advice given to you by your health care provider. Make sure you discuss any questions you have with your health care provider.   Document Released: 11/26/2005 Document Revised: 04/12/2015 Document Reviewed: 12/26/2012 Else[employer] Interactive Patient Education [employer].

## 2016-05-13 NOTE — ED Notes (Signed)
Bed: WA20 Expected date:  Expected time:  Means of arrival:  Comments: EMS- 82 yo, Low O2-eval

## 2016-10-10 DEATH — deceased
